# Patient Record
Sex: Male | Born: 1935 | Race: White | Hispanic: No | Marital: Single | Smoking: Former smoker
Health system: Southern US, Community
[De-identification: ages and names within clinical notes are randomized; demographics above are authoritative.]

## PROBLEM LIST (undated history)

## (undated) DIAGNOSIS — I509 Heart failure, unspecified: Secondary | ICD-10-CM

## (undated) DIAGNOSIS — I4891 Unspecified atrial fibrillation: Secondary | ICD-10-CM

## (undated) DIAGNOSIS — I2510 Atherosclerotic heart disease of native coronary artery without angina pectoris: Secondary | ICD-10-CM

## (undated) DIAGNOSIS — M1990 Unspecified osteoarthritis, unspecified site: Secondary | ICD-10-CM

## (undated) DIAGNOSIS — Z8546 Personal history of malignant neoplasm of prostate: Secondary | ICD-10-CM

## (undated) DIAGNOSIS — E785 Hyperlipidemia, unspecified: Secondary | ICD-10-CM

## (undated) DIAGNOSIS — K625 Hemorrhage of anus and rectum: Secondary | ICD-10-CM

## (undated) DIAGNOSIS — I10 Essential (primary) hypertension: Secondary | ICD-10-CM

## (undated) DIAGNOSIS — K5730 Diverticulosis of large intestine without perforation or abscess without bleeding: Secondary | ICD-10-CM

## (undated) DIAGNOSIS — M109 Gout, unspecified: Secondary | ICD-10-CM

## (undated) DIAGNOSIS — C801 Malignant (primary) neoplasm, unspecified: Secondary | ICD-10-CM

## (undated) HISTORY — PX: APPENDECTOMY: SHX54

## (undated) HISTORY — DX: Unspecified atrial fibrillation: I48.91

## (undated) HISTORY — DX: Atherosclerotic heart disease of native coronary artery without angina pectoris: I25.10

## (undated) HISTORY — DX: Hyperlipidemia, unspecified: E78.5

---

## 1994-10-31 DIAGNOSIS — Z8546 Personal history of malignant neoplasm of prostate: Secondary | ICD-10-CM

## 1994-10-31 HISTORY — DX: Personal history of malignant neoplasm of prostate: Z85.46

## 1995-04-01 HISTORY — PX: CORONARY ARTERY BYPASS GRAFT: SHX141

## 1995-04-10 HISTORY — PX: CARDIAC CATHETERIZATION: SHX172

## 2000-07-28 ENCOUNTER — Encounter: Payer: Self-pay | Admitting: *Deleted

## 2000-07-28 ENCOUNTER — Encounter: Admission: RE | Admit: 2000-07-28 | Discharge: 2000-07-28 | Payer: Self-pay | Admitting: *Deleted

## 2000-11-03 ENCOUNTER — Ambulatory Visit (HOSPITAL_COMMUNITY): Admission: RE | Admit: 2000-11-03 | Discharge: 2000-11-03 | Payer: Self-pay | Admitting: Gastroenterology

## 2000-11-03 HISTORY — PX: COLONOSCOPY: SHX174

## 2001-05-08 ENCOUNTER — Encounter (INDEPENDENT_AMBULATORY_CARE_PROVIDER_SITE_OTHER): Payer: Self-pay | Admitting: Specialist

## 2001-05-08 ENCOUNTER — Other Ambulatory Visit: Admission: RE | Admit: 2001-05-08 | Discharge: 2001-05-08 | Payer: Self-pay | Admitting: Urology

## 2003-05-08 ENCOUNTER — Ambulatory Visit (HOSPITAL_COMMUNITY): Admission: RE | Admit: 2003-05-08 | Discharge: 2003-05-08 | Payer: Self-pay | Admitting: Gastroenterology

## 2003-05-08 ENCOUNTER — Encounter (INDEPENDENT_AMBULATORY_CARE_PROVIDER_SITE_OTHER): Payer: Self-pay | Admitting: Specialist

## 2004-05-20 ENCOUNTER — Ambulatory Visit (HOSPITAL_COMMUNITY): Admission: RE | Admit: 2004-05-20 | Discharge: 2004-05-20 | Payer: Self-pay | Admitting: Urology

## 2005-04-07 ENCOUNTER — Ambulatory Visit (HOSPITAL_COMMUNITY): Admission: RE | Admit: 2005-04-07 | Discharge: 2005-04-07 | Payer: Self-pay | Admitting: Urology

## 2005-05-04 HISTORY — PX: CARDIAC CATHETERIZATION: SHX172

## 2005-05-19 ENCOUNTER — Encounter (HOSPITAL_COMMUNITY): Admission: RE | Admit: 2005-05-19 | Discharge: 2005-07-26 | Payer: Self-pay | Admitting: Urology

## 2005-06-23 ENCOUNTER — Ambulatory Visit (HOSPITAL_COMMUNITY): Admission: RE | Admit: 2005-06-23 | Discharge: 2005-06-23 | Payer: Self-pay | Admitting: Urology

## 2005-06-23 HISTORY — PX: OTHER SURGICAL HISTORY: SHX169

## 2007-11-01 HISTORY — PX: EYE SURGERY: SHX253

## 2008-10-31 DIAGNOSIS — M109 Gout, unspecified: Secondary | ICD-10-CM

## 2008-10-31 HISTORY — DX: Gout, unspecified: M10.9

## 2009-09-23 ENCOUNTER — Encounter (HOSPITAL_COMMUNITY): Admission: RE | Admit: 2009-09-23 | Discharge: 2009-12-22 | Payer: Self-pay | Admitting: Internal Medicine

## 2010-11-03 ENCOUNTER — Encounter
Admission: RE | Admit: 2010-11-03 | Discharge: 2010-11-30 | Payer: Self-pay | Source: Home / Self Care | Attending: Internal Medicine | Admitting: Internal Medicine

## 2011-01-16 LAB — CBC
HCT: 34.2 % — ABNORMAL LOW (ref 39.0–52.0)
Hemoglobin: 11.5 g/dL — ABNORMAL LOW (ref 13.0–17.0)
MCHC: 33.7 g/dL (ref 30.0–36.0)
MCV: 85.3 fL (ref 78.0–100.0)
Platelets: 177 10*3/uL (ref 150–400)
RBC: 4 MIL/uL — ABNORMAL LOW (ref 4.22–5.81)
RDW: 16.5 % — ABNORMAL HIGH (ref 11.5–15.5)
WBC: 13.3 10*3/uL — ABNORMAL HIGH (ref 4.0–10.5)

## 2011-01-31 LAB — CBC
HCT: 34.3 % — ABNORMAL LOW (ref 39.0–52.0)
Hemoglobin: 11.6 g/dL — ABNORMAL LOW (ref 13.0–17.0)
RBC: 4.06 MIL/uL — ABNORMAL LOW (ref 4.22–5.81)
RDW: 16 % — ABNORMAL HIGH (ref 11.5–15.5)

## 2011-02-01 LAB — CBC
HCT: 32.6 % — ABNORMAL LOW (ref 39.0–52.0)
Hemoglobin: 11 g/dL — ABNORMAL LOW (ref 13.0–17.0)
MCHC: 33.8 g/dL (ref 30.0–36.0)
MCV: 85.8 fL (ref 78.0–100.0)
RDW: 15.8 % — ABNORMAL HIGH (ref 11.5–15.5)

## 2011-03-18 NOTE — Op Note (Signed)
   NAME:  [name] [name], [name] [name]                             ACCOUNT NO.:  [id number]   MEDICAL RECORD NO.:  [id number]                   PATIENT TYPE:  AMB   LOCATION:  ENDO                                 FACILITY:  [facility]   PHYSICIAN:  James L. Malon Kindle., M.D.          DATE OF BIRTH:  May 20, 1921   DATE OF PROCEDURE:  05/08/2003  DATE OF DISCHARGE:                                 OPERATIVE REPORT   PROCEDURE:  Colonoscopy and biopsy   ENDOSCOPIST:  Llana Aliment. Edwards, M.D.   MEDICATIONS:  Fentanyl 50 mcg, Versed 5 mg IV.   SCOPE:  Olympus pediatric colonoscope.   INDICATIONS:  Continued rectal bleeding with a history of ulcerative  colitis.   DESCRIPTION OF PROCEDURE:  The procedure had been explained to the patient  and consent obtained.  With the patient in the left lateral decubitus, the  Olympus scope was inserted and advanced.  The area of the rectum and sigmoid  were seen to be severely  ulcerated.  We did advance easily to the cecum.  The cecum, ileocecal valve and appendiceal orifice were seen.  The majority  of the colon appeared to be endoscopically normal.  Biopsies were taken of  the ascending colon and transverse colon and placed in jar #1 and #2.  Endoscopically, the left colon was seen from the splenic flexure to the  proximal __________ to 50 cm.  This appeared endoscopically normal.  There  was a translation zone right about 50 cm, and the colon became severely  inflamed and ulcerated with confluent ulcers consistent with left-sided  ulcerative colitis. This was biopsied and placed in jar #3.  The scope was  withdrawn.  The patient tolerated the procedure well.   ASSESSMENT:  Severe left sided ulcerative colitis that has been refractory  to Imuran, Endocort, and Azulfidine.   PLAN:  We will give a trial of Cortenema one q.h.s. and see back in the  office in one month.                                               James L. Malon Kindle., M.D.    Waldron Session  D:   05/08/2003  T:  05/09/2003  Job:  [id number]   cc:   Wilson Singer, M.D.  [address]  [place]  [place] [address]  Fax: [phone]

## 2011-03-18 NOTE — Procedures (Signed)
[facility]  Patient:    [name] [name], [name] [name]                          MRN: [id number] Proc. Date: 11/03/00 Attending:  Verlin Grills, M.D. CC:         Runell Gess, M.D.  Maretta Bees. Vonita Moss, M.D.  Georg Ruddle. Viviann Spare, M.D.   Procedure Report  PROCEDURE:  Colonoscopy.  PROCEDURE INDICATION:  [name] [name] (date of birth July 12, 1921) is a 77 year old male undergoing diagnostic colonoscopy to evaluate Hemoccult-positive stool.  [name] [name] has undergone radiation therapy for prostate cancer in the past.  He has known colonic diverticulosis.  There is no history of colon cancer.  I discussed with [name] [name] the complications associated with colonoscopy and polypectomy, including intestinal bleeding and intestinal perforation.  [name] [name] has signed the operative permit.  PAST MEDICAL HISTORY:  Hypertension, coronary artery disease with coronary artery bypass grafting in July 1996, anaphylactic reaction to ANCEF, prostate cancer treated with radiation therapy, colonic diverticulosis.  ENDOSCOPIST:  Verlin Grills, M.D.  PREMEDICATION:  Versed 5 mg, Demerol 50 mg.  ENDOSCOPE:  Olympus pediatric colonoscope.  DESCRIPTION OF PROCEDURE:  After obtaining informed consent, the patient was placed in the left lateral decubitus position.  I administered intravenous Demerol and intravenous Versed to achieve conscious sedation for the procedure.  The patients blood pressure, oxygen saturation, and cardiac rhythm were monitored throughout the procedure and documented in the medical record.  Anal inspection was normal.  Digital rectal exam was normal.  The prostate was not palpable.  The Olympus pediatric video colonoscope was introduced into the rectum and under direct vision advanced to the cecum, as identified by a normal-appearing ileocecal valve.  Colonic preparation for the exam today was excellent.  Rectum normal.  Sigmoid colon and  descending colon:  Extensive colonic diverticulosis.  Splenic flexure normal.  Transverse colon normal.  Hepatic flexure normal.  Ascending colon normal.  Cecum and ileocecal valve normal.  ASSESSMENT: 1. Left colonic diverticulosis. 2. No endoscopic evidence for the presence of colorectal neoplasia or colon    cancer.  RECOMMENDATIONS:  Repeat colonoscopy in five to 10 years. DD:  11/03/00 TD:  11/03/00 Job: [id number]

## 2011-03-18 NOTE — Op Note (Signed)
[name] [name], [name] [name]                 ACCOUNT NO.:  [id number]   MEDICAL RECORD NO.:  [id number]          PATIENT TYPE:  AMB   LOCATION:  DAY                          FACILITY:  [facility]   PHYSICIAN:  Maretta Bees. Vonita Moss, M.D.DATE OF BIRTH:  08-29-1921   DATE OF PROCEDURE:  06/23/2005  DATE OF DISCHARGE:                                 OPERATIVE REPORT   PREOPERATIVE DIAGNOSIS:  Recurrent prostatic carcinoma.   POSTOPERATIVE DIAGNOSES:  Recurrent prostatic carcinoma.   PROCEDURE:  Cryoablation of the prostate.   SURGEON:  Dr. Larey Dresser.   ASSISTANT:  Dr. Glade Nurse.   PROCTOR:  Dr. Galen Daft.   ANESTHESIA:  General.   INDICATIONS:  This 77 year old gentleman was diagnosed with prostatic  carcinoma 10 years ago and was going to undergo radical prostatectomy but he  had a severe allergic reaction the CEPHALEXIN and postoperatively it was  found he had asymptomatic severe coronary artery disease that was corrected  with a coronary artery bypass graft and then he underwent radiotherapy to  the prostate. Fortunately his PSA has risen again to a level of 12 and a  prostate biopsy on May 30 showed Gleason grade 7 carcinoma bilaterally in  60% of the tissue on each side. He was worked up with a bone scan and CT  scan and there was no evidence of metastatic disease and a Prostascint scan  was also negative for metastatic disease and consistent with localized  disease in the prostate. He and his wife were counseled about cryotherapy  and this was felt to be appropriate view of his age of 38 due to the fact  that he still works actively as an Chief Technology Officer. He was cleared  preoperatively by his cardiologist.   DESCRIPTION OF PROCEDURE:  The patient was brought to the operating room,  placed in lithotomy position. The external genitalia and lower abdomen  prepped and draped in the usual fashion. He was cystoscoped with a flexible  scope and the prostatic urethra was  unremarkable. The bladder was normal.  With the bladder full and under visual control, a stab wound was made in the  suprapubic area and a microinvasive suprapubic catheter was placed in  excellent position. A Foley catheter was reinserted. He underwent  transrectal ultrasound planning and in view of the prostatic size of 40 mL  and the urethral length of 4 cm, it was felt that freeze rods would be the  appropriate tools to utilize. Two probes were placed in the anterolateral  aspect of the prostate so they were located a centimeter from the urethra  and a centimeter from the capsule. A second row of two probes were placed  again with the same precautions to protect the urethra from freezing. A  bottom row three probes were placed to create a typical pyramidal  configuration. A posterior capsular temperature probe was placed and another  probe was placed at the level of the external sphincter  to monitor  temperature. He was then cystoscoped, there was no evidence of the needle  perforation in the urethra or bladder. With a  stiff guidewire, the urethral  warming device was placed without difficulty. He then underwent two  freezings with an interval active thaw that created an excellent ice ball to  the capsule and distally covering the prostate but not damaging the  sphincter. The external sphincter probe temperature was around 18 to 20  degrees. He was then started again on a thaw that allowed [place] to remove all  the needles and the  urethral warming device was run for 20 minutes. The suprapubic tube was sewn  in place and suprapubic tube connected to closed drainage. He was taken to  the recovery room in good condition having tolerated the procedure well.           ______________________________  Maretta Bees. Vonita Moss, M.D.  Electronically Signed     LJP/MEDQ  D:  06/23/2005  T:  06/23/2005  Job:  [id number]

## 2011-09-21 ENCOUNTER — Other Ambulatory Visit (HOSPITAL_COMMUNITY): Payer: Self-pay | Admitting: *Deleted

## 2011-09-27 ENCOUNTER — Encounter (HOSPITAL_COMMUNITY)
Admission: RE | Admit: 2011-09-27 | Discharge: 2011-09-27 | Disposition: A | Discharge status: Home / Self Care | Payer: PRIVATE HEALTH INSURANCE | Source: Ambulatory Visit | Attending: Internal Medicine | Admitting: Internal Medicine

## 2011-09-27 ENCOUNTER — Encounter (HOSPITAL_COMMUNITY): Payer: Self-pay

## 2011-09-27 DIAGNOSIS — D649 Anemia, unspecified: Secondary | ICD-10-CM | POA: Insufficient documentation

## 2011-09-27 HISTORY — DX: Essential (primary) hypertension: I10

## 2011-09-27 HISTORY — DX: Personal history of malignant neoplasm of prostate: Z85.46

## 2011-09-27 MED ORDER — SODIUM CHLORIDE 0.9 % IV SOLN
INTRAVENOUS | Status: DC
  Administered 2011-09-27: 20 mL via INTRAVENOUS

## 2011-09-27 MED ORDER — FERUMOXYTOL INJECTION 510 MG/17 ML
510.0000 mg | Freq: Once | INTRAVENOUS | Status: DC
  Administered 2011-09-27: 510 mg via INTRAVENOUS
  Filled 2011-09-27 (×2): qty 17

## 2011-09-28 ENCOUNTER — Other Ambulatory Visit (HOSPITAL_COMMUNITY): Payer: Self-pay | Admitting: *Deleted

## 2011-09-29 ENCOUNTER — Other Ambulatory Visit (HOSPITAL_COMMUNITY): Payer: Self-pay | Admitting: *Deleted

## 2011-09-30 ENCOUNTER — Encounter (HOSPITAL_COMMUNITY)
Admission: RE | Admit: 2011-09-30 | Discharge: 2011-09-30 | Disposition: A | Discharge status: Home / Self Care | Payer: PRIVATE HEALTH INSURANCE | Source: Ambulatory Visit | Attending: Internal Medicine | Admitting: Internal Medicine

## 2011-09-30 ENCOUNTER — Encounter (HOSPITAL_COMMUNITY): Payer: Self-pay

## 2011-09-30 MED ORDER — FERUMOXYTOL INJECTION 510 MG/17 ML
510.0000 mg | INTRAVENOUS | Status: AC
  Administered 2011-09-30: 510 mg via INTRAVENOUS
  Filled 2011-09-30: qty 17

## 2011-09-30 MED ORDER — SODIUM CHLORIDE 0.9 % IV SOLN
Freq: Once | INTRAVENOUS | Status: AC
  Administered 2011-09-30: 16:00:00 via INTRAVENOUS

## 2012-07-04 ENCOUNTER — Other Ambulatory Visit (HOSPITAL_COMMUNITY): Payer: Self-pay | Admitting: *Deleted

## 2012-07-09 ENCOUNTER — Encounter (HOSPITAL_COMMUNITY): Payer: Self-pay

## 2012-07-09 ENCOUNTER — Encounter (HOSPITAL_COMMUNITY)
Admission: RE | Admit: 2012-07-09 | Discharge: 2012-07-09 | Disposition: A | Discharge status: Home / Self Care | Payer: PRIVATE HEALTH INSURANCE | Source: Ambulatory Visit | Attending: Internal Medicine | Admitting: Internal Medicine

## 2012-07-09 DIAGNOSIS — D649 Anemia, unspecified: Secondary | ICD-10-CM | POA: Insufficient documentation

## 2012-07-09 HISTORY — DX: Unspecified osteoarthritis, unspecified site: M19.90

## 2012-07-09 HISTORY — DX: Gout, unspecified: M10.9

## 2012-07-09 MED ORDER — SODIUM CHLORIDE 0.9 % IV SOLN
INTRAVENOUS | Status: DC
  Administered 2012-07-09: 11:00:00 via INTRAVENOUS

## 2012-07-09 MED ORDER — FERUMOXYTOL INJECTION 510 MG/17 ML
510.0000 mg | INTRAVENOUS | Status: DC
  Administered 2012-07-09: 510 mg via INTRAVENOUS
  Filled 2012-07-09: qty 17

## 2012-07-09 NOTE — Progress Notes (Signed)
No immediate problems w feraheme

## 2012-07-16 ENCOUNTER — Encounter (HOSPITAL_COMMUNITY): Payer: Self-pay

## 2012-07-16 ENCOUNTER — Encounter (HOSPITAL_COMMUNITY)
Admission: RE | Admit: 2012-07-16 | Discharge: 2012-07-16 | Disposition: A | Discharge status: Home / Self Care | Payer: PRIVATE HEALTH INSURANCE | Source: Ambulatory Visit | Attending: Internal Medicine | Admitting: Internal Medicine

## 2012-07-16 MED ORDER — FERUMOXYTOL INJECTION 510 MG/17 ML
510.0000 mg | INTRAVENOUS | Status: AC
  Administered 2012-07-16: 510 mg via INTRAVENOUS
  Filled 2012-07-16: qty 17

## 2012-07-16 MED ORDER — SODIUM CHLORIDE 0.9 % IV SOLN
INTRAVENOUS | Status: AC
  Administered 2012-07-16: 11:00:00 via INTRAVENOUS

## 2013-01-16 ENCOUNTER — Ambulatory Visit: Payer: Self-pay | Admitting: Cardiovascular Disease

## 2013-01-16 DIAGNOSIS — Z7901 Long term (current) use of anticoagulants: Secondary | ICD-10-CM

## 2013-01-16 DIAGNOSIS — I4891 Unspecified atrial fibrillation: Secondary | ICD-10-CM | POA: Insufficient documentation

## 2013-04-16 ENCOUNTER — Inpatient Hospital Stay (HOSPITAL_COMMUNITY)
Admission: EM | Admit: 2013-04-16 | Discharge: 2013-04-18 | DRG: 377 | Disposition: A | Discharge status: Home Health | Payer: PRIVATE HEALTH INSURANCE | Attending: Internal Medicine | Admitting: Internal Medicine

## 2013-04-16 ENCOUNTER — Encounter (HOSPITAL_COMMUNITY): Payer: Self-pay | Admitting: Emergency Medicine

## 2013-04-16 ENCOUNTER — Emergency Department (HOSPITAL_COMMUNITY): Payer: PRIVATE HEALTH INSURANCE

## 2013-04-16 DIAGNOSIS — D649 Anemia, unspecified: Secondary | ICD-10-CM | POA: Diagnosis present

## 2013-04-16 DIAGNOSIS — D62 Acute posthemorrhagic anemia: Secondary | ICD-10-CM | POA: Diagnosis present

## 2013-04-16 DIAGNOSIS — I5041 Acute combined systolic (congestive) and diastolic (congestive) heart failure: Secondary | ICD-10-CM

## 2013-04-16 DIAGNOSIS — K922 Gastrointestinal hemorrhage, unspecified: Secondary | ICD-10-CM | POA: Diagnosis present

## 2013-04-16 DIAGNOSIS — N183 Chronic kidney disease, stage 3 unspecified: Secondary | ICD-10-CM | POA: Diagnosis present

## 2013-04-16 DIAGNOSIS — I2789 Other specified pulmonary heart diseases: Secondary | ICD-10-CM | POA: Diagnosis present

## 2013-04-16 DIAGNOSIS — Z8719 Personal history of other diseases of the digestive system: Secondary | ICD-10-CM

## 2013-04-16 DIAGNOSIS — Z7901 Long term (current) use of anticoagulants: Secondary | ICD-10-CM

## 2013-04-16 DIAGNOSIS — I10 Essential (primary) hypertension: Secondary | ICD-10-CM | POA: Diagnosis present

## 2013-04-16 DIAGNOSIS — M109 Gout, unspecified: Secondary | ICD-10-CM | POA: Diagnosis present

## 2013-04-16 DIAGNOSIS — I359 Nonrheumatic aortic valve disorder, unspecified: Secondary | ICD-10-CM | POA: Diagnosis present

## 2013-04-16 DIAGNOSIS — Z8546 Personal history of malignant neoplasm of prostate: Secondary | ICD-10-CM

## 2013-04-16 DIAGNOSIS — I5032 Chronic diastolic (congestive) heart failure: Secondary | ICD-10-CM | POA: Diagnosis present

## 2013-04-16 DIAGNOSIS — I129 Hypertensive chronic kidney disease with stage 1 through stage 4 chronic kidney disease, or unspecified chronic kidney disease: Secondary | ICD-10-CM | POA: Diagnosis present

## 2013-04-16 DIAGNOSIS — Z79899 Other long term (current) drug therapy: Secondary | ICD-10-CM

## 2013-04-16 DIAGNOSIS — Z66 Do not resuscitate: Secondary | ICD-10-CM | POA: Diagnosis not present

## 2013-04-16 DIAGNOSIS — D509 Iron deficiency anemia, unspecified: Secondary | ICD-10-CM | POA: Diagnosis present

## 2013-04-16 DIAGNOSIS — I4891 Unspecified atrial fibrillation: Secondary | ICD-10-CM | POA: Diagnosis present

## 2013-04-16 DIAGNOSIS — K5731 Diverticulosis of large intestine without perforation or abscess with bleeding: Principal | ICD-10-CM | POA: Diagnosis present

## 2013-04-16 DIAGNOSIS — I25810 Atherosclerosis of coronary artery bypass graft(s) without angina pectoris: Secondary | ICD-10-CM | POA: Diagnosis present

## 2013-04-16 DIAGNOSIS — E8770 Fluid overload, unspecified: Secondary | ICD-10-CM | POA: Diagnosis present

## 2013-04-16 DIAGNOSIS — I872 Venous insufficiency (chronic) (peripheral): Secondary | ICD-10-CM | POA: Diagnosis present

## 2013-04-16 DIAGNOSIS — E8779 Other fluid overload: Secondary | ICD-10-CM

## 2013-04-16 DIAGNOSIS — E785 Hyperlipidemia, unspecified: Secondary | ICD-10-CM | POA: Diagnosis present

## 2013-04-16 DIAGNOSIS — K219 Gastro-esophageal reflux disease without esophagitis: Secondary | ICD-10-CM | POA: Diagnosis present

## 2013-04-16 DIAGNOSIS — I509 Heart failure, unspecified: Secondary | ICD-10-CM | POA: Diagnosis present

## 2013-04-16 HISTORY — DX: Diverticulosis of large intestine without perforation or abscess without bleeding: K57.30

## 2013-04-16 HISTORY — DX: Malignant (primary) neoplasm, unspecified: C80.1

## 2013-04-16 LAB — PROTIME-INR
INR: 1.15 (ref 0.00–1.49)
Prothrombin Time: 14.5 seconds (ref 11.6–15.2)

## 2013-04-16 LAB — CBC WITH DIFFERENTIAL/PLATELET
Basophils Absolute: 0 10*3/uL (ref 0.0–0.1)
Eosinophils Absolute: 0 10*3/uL (ref 0.0–0.7)
Eosinophils Relative: 1 % (ref 0–5)
MCH: 26.9 pg (ref 26.0–34.0)
MCHC: 31.6 g/dL (ref 30.0–36.0)
MCV: 85.2 fL (ref 78.0–100.0)
Monocytes Absolute: 0.4 10*3/uL (ref 0.1–1.0)
Platelets: 208 10*3/uL (ref 150–400)
RDW: 19 % — ABNORMAL HIGH (ref 11.5–15.5)
WBC: 8.8 10*3/uL (ref 4.0–10.5)

## 2013-04-16 LAB — PREPARE RBC (CROSSMATCH)

## 2013-04-16 LAB — COMPREHENSIVE METABOLIC PANEL
ALT: 13 U/L (ref 0–53)
AST: 20 U/L (ref 0–37)
Calcium: 9 mg/dL (ref 8.4–10.5)
Creatinine, Ser: 1.82 mg/dL — ABNORMAL HIGH (ref 0.50–1.35)
GFR calc Af Amer: 36 mL/min — ABNORMAL LOW (ref >90)
Sodium: 140 mEq/L (ref 135–145)
Total Protein: 8.2 g/dL (ref 6.0–8.3)

## 2013-04-16 LAB — OCCULT BLOOD, POC DEVICE: Fecal Occult Bld: POSITIVE — AB

## 2013-04-16 LAB — ABO/RH: ABO/RH(D): O NEG

## 2013-04-16 LAB — TROPONIN I: Troponin I: <0.3 ng/mL (ref <0.30)

## 2013-04-16 MED ORDER — PANTOPRAZOLE SODIUM 40 MG IV SOLR
40.0000 mg | Freq: Once | INTRAVENOUS | Status: AC
  Administered 2013-04-16: 40 mg via INTRAVENOUS
  Filled 2013-04-16: qty 40

## 2013-04-16 MED ORDER — PANTOPRAZOLE SODIUM 40 MG PO TBEC
40.0000 mg | DELAYED_RELEASE_TABLET | Freq: Two times a day (BID) | ORAL | Status: DC
  Administered 2013-04-16 – 2013-04-18 (×4): 40 mg via ORAL
  Filled 2013-04-16 (×7): qty 1

## 2013-04-16 MED ORDER — FUROSEMIDE 10 MG/ML IJ SOLN
40.0000 mg | Freq: Two times a day (BID) | INTRAMUSCULAR | Status: DC
  Administered 2013-04-16: 40 mg via INTRAVENOUS
  Filled 2013-04-16 (×4): qty 4

## 2013-04-16 MED ORDER — ACETAMINOPHEN 650 MG RE SUPP: 650.0000 mg | Freq: Four times a day (QID) | RECTAL | Status: DC | PRN

## 2013-04-16 MED ORDER — DOXYCYCLINE HYCLATE 100 MG PO TABS
100.0000 mg | ORAL_TABLET | Freq: Two times a day (BID) | ORAL | Status: DC
  Administered 2013-04-16 – 2013-04-18 (×4): 100 mg via ORAL
  Filled 2013-04-16 (×5): qty 1

## 2013-04-16 MED ORDER — MORPHINE SULFATE 2 MG/ML IJ SOLN
2.0000 mg | Freq: Once | INTRAMUSCULAR | Status: AC
  Administered 2013-04-16: 2 mg via INTRAVENOUS
  Filled 2013-04-16: qty 1

## 2013-04-16 MED ORDER — ACETAMINOPHEN 325 MG PO TABS
650.0000 mg | ORAL_TABLET | Freq: Four times a day (QID) | ORAL | Status: DC | PRN
  Administered 2013-04-16 – 2013-04-18 (×4): 650 mg via ORAL
  Filled 2013-04-16 (×4): qty 2

## 2013-04-16 MED ORDER — SIMVASTATIN 20 MG PO TABS
20.0000 mg | ORAL_TABLET | Freq: Every day | ORAL | Status: DC
  Administered 2013-04-16 – 2013-04-17 (×2): 20 mg via ORAL
  Filled 2013-04-16 (×3): qty 1

## 2013-04-16 MED ORDER — SODIUM CHLORIDE 0.9 % IJ SOLN
3.0000 mL | Freq: Two times a day (BID) | INTRAMUSCULAR | Status: DC
  Administered 2013-04-16 – 2013-04-17 (×2): 3 mL via INTRAVENOUS

## 2013-04-16 MED ORDER — IRBESARTAN 150 MG PO TABS
150.0000 mg | ORAL_TABLET | Freq: Every day | ORAL | Status: DC
  Administered 2013-04-16 – 2013-04-18 (×3): 150 mg via ORAL
  Filled 2013-04-16 (×3): qty 1

## 2013-04-16 MED ORDER — ACETAMINOPHEN 325 MG PO TABS
650.0000 mg | ORAL_TABLET | Freq: Once | ORAL | Status: AC
  Administered 2013-04-16: 650 mg via ORAL
  Filled 2013-04-16: qty 2

## 2013-04-16 MED ORDER — METOPROLOL TARTRATE 50 MG PO TABS
50.0000 mg | ORAL_TABLET | Freq: Two times a day (BID) | ORAL | Status: DC
  Administered 2013-04-17 – 2013-04-18 (×3): 50 mg via ORAL
  Filled 2013-04-16 (×5): qty 1

## 2013-04-16 MED ORDER — ONDANSETRON HCL 4 MG/2ML IJ SOLN
4.0000 mg | Freq: Once | INTRAMUSCULAR | Status: AC
  Administered 2013-04-16: 4 mg via INTRAVENOUS
  Filled 2013-04-16: qty 2

## 2013-04-16 MED ORDER — ONDANSETRON HCL 4 MG PO TABS: 4.0000 mg | ORAL_TABLET | Freq: Four times a day (QID) | ORAL | Status: DC | PRN

## 2013-04-16 MED ORDER — ONDANSETRON HCL 4 MG/2ML IJ SOLN: 4.0000 mg | Freq: Four times a day (QID) | INTRAMUSCULAR | Status: DC | PRN

## 2013-04-16 MED ORDER — DIPHENHYDRAMINE HCL 25 MG PO CAPS
25.0000 mg | ORAL_CAPSULE | Freq: Once | ORAL | Status: AC
  Administered 2013-04-16: 25 mg via ORAL
  Filled 2013-04-16: qty 1

## 2013-04-16 MED ORDER — FUROSEMIDE 10 MG/ML IJ SOLN
80.0000 mg | Freq: Once | INTRAMUSCULAR | Status: AC
  Administered 2013-04-16: 80 mg via INTRAVENOUS
  Filled 2013-04-16 (×2): qty 4

## 2013-04-16 NOTE — Consult Note (Signed)
Referring Provider: Dr. Timothy Lasso    Primary Care Physician:  Gwen Pounds, MD Primary Gastroenterologist:  Dr. Randa Evens  Reason for Consultation:  Rectal bleeding  HPI: [name] [name] is a 79 y.o. male with a history of left-sided ulcerative colitis, who was last seen by Dr. Randa Evens in 2011 and at that time was on Prednisone, Colazal, and Hydrocortisone suppositories. He reports stopping those meds at some point and has been off of meds for over a year. States that he has not had any rectal bleeding or abdominal pain until recently when he had the acute onset of bright red blood per rectum with clots and stool. Rectal bleeding occurred multiple times yesterday without associated abdominal pain. Reportedly had dark brown clots with the BRBPR. Hgb 7.8 prior to admit and 8.0 on admit. Reports being off of Coumadin 3-4 weeks that he had been on for Afib. Last colonoscopy in 2007 that showed left-sided ulcerative colitis. He has been having leg pains and swelling and is being worked up for that. Receiving his 2nd unit of blood at this time.        Past Medical History  Diagnosis Date  . Hypertension   . History of prostate cancer 1996    treated with radiation  . Arthritis   . Gout, arthritis 2010    bil feet  . Cancer     PORSTATE-HX OF RADIATION AND SURGERY  . Diverticulosis of colon     Past Surgical History  Procedure Laterality Date  . Coronary artery bypass graft  04/1995  . Appendectomy  1950s  . Eye surgery  2009    cataract surgery  . Cryoablation of prostate  06/23/2005  . Colonoscopy  11/03/2000    Prior to Admission medications   Medication Sig Start Date End Date Taking? Authorizing Provider  aspirin 81 MG tablet Take 81 mg by mouth daily.   Yes Historical Provider, MD  calcium carbonate (TUMS - DOSED IN MG ELEMENTAL CALCIUM) 500 MG chewable tablet Chew 1 tablet by mouth as needed.   Yes Historical Provider, MD  furosemide (LASIX) 40 MG tablet Take 40 mg by mouth daily.   Yes  Historical Provider, MD  lovastatin (MEVACOR) 40 MG tablet Take 40 mg by mouth at bedtime.     Yes Historical Provider, MD  metoprolol (LOPRESSOR) 50 MG tablet Take 50 mg by mouth 2 (two) times daily.     Yes Historical Provider, MD  valsartan (DIOVAN) 80 MG tablet Take 80 mg by mouth daily.   Yes Historical Provider, MD  valsartan-hydrochlorothiazide (DIOVAN-HCT) 80-12.5 MG per tablet Take 1 tablet by mouth daily.     Yes Historical Provider, MD    Scheduled Meds: . doxycycline  100 mg Oral Q12H  . furosemide  40 mg Intravenous BID  . irbesartan  150 mg Oral Daily  . metoprolol  50 mg Oral BID  . pantoprazole  40 mg Oral BID AC  . simvastatin  20 mg Oral q1800  . sodium chloride  3 mL Intravenous Q12H   Continuous Infusions:  PRN Meds:.acetaminophen, acetaminophen, ondansetron (ZOFRAN) IV, ondansetron  Allergies as of 04/16/2013 - Review Complete 04/16/2013  Allergen Reaction Noted  . Ancef (cefazolin) Anaphylaxis 04/16/2013  . Penicillins cross reactors  09/27/2011  . Sulfa antibiotics  07/09/2012    History reviewed. No pertinent family history.  History   Social History  . Marital Status: Single    Spouse Name: N/A    Number of Children: N/A  . Years of  Education: N/A   Occupational History  . Not on file.   Social History Main Topics  . Smoking status: Former Smoker    Quit date: 09/26/1984  . Smokeless tobacco: Never Used  . Alcohol Use: No  . Drug Use: No  . Sexually Active: No   Other Topics Concern  . Not on file   Social History Narrative  . No narrative on file    Review of Systems: All negative from GI standpoint except as stated above in HPI.  Physical Exam: Vital signs: Filed Vitals:   04/16/13 1655  BP: 171/82  Pulse: 56  Temp: 97.4  Resp: 20     General:   Elderly, Alert,  Well-developed, well-nourished, pleasant and cooperative in NAD HEENT: anicteric, oropharynx clear Neck: nontender, supple Lungs:  Clear throughout to  auscultation.   No wheezes, crackles, or rhonchi. No acute distress. Heart:  Irregularly irregular rhythm Abdomen: soft, nontender, nondistended, +BS  Rectal:  Deferred Ext: LE erythema, edema, and bullae noted  GI:  Lab Results:  Recent Labs  04/16/13 0854  WBC 8.8  HGB 8.0*  HCT 25.3*  PLT 208   BMET  Recent Labs  04/16/13 0854  NA 140  K 4.1  CL 105  CO2 27  GLUCOSE 111*  BUN 51*  CREATININE 1.82*  CALCIUM 9.0   LFT  Recent Labs  04/16/13 0854  PROT 8.2  ALBUMIN 2.8*  AST 20  ALT 13  ALKPHOS 140*  BILITOT 0.5   PT/INR  Recent Labs  04/16/13 0854  LABPROT 14.5  INR 1.15     Studies/Results: Dg Chest Port 1 View  04/16/2013   *RADIOLOGY REPORT*  Clinical Data: Cough.  PORTABLE CHEST - 1 VIEW  Comparison: None.  Findings: The heart is enlarged.  There are surgical changes from bypass surgery.  There is tortuosity and calcification of the thoracic aorta.  There are increased interstitial markings and peribronchial thickening which could reflect chronic bronchitic changes related to smoking. Mild elevation of the left hemidiaphragm with overlying vascular crowding and atelectasis.  No edema or definite effusions.  The bony thorax is intact.  IMPRESSION:  1.  Cardiac enlargement. 2.  Probable chronic bronchitic type lung changes.  No definite infiltrates or effusions. 3.  Left basilar atelectasis.   Original Report Authenticated By: Rudie Meyer, M.D.    Impression/Plan: 79 yo with history of left-sided ulcerative colitis presenting with the acute onset of BRBPR without associated diarrhea or abdominal pain. I do NOT think his bleeding is from his UC and he could be having a diverticular bleed. If bleeding returns with abdominal pain or diarrhea or bleeding persists then may need to add Rowasa enemas or PO mesalamine. For now would NOT recommend restarting UC treatment and would recommend conservative treatment and avoidance of Coumadin if cards ok with that.  Available to see again if needed. Will sign off but do not hesitate to call if questions. Thank you for this consultation.     LOS: 0 days   [name] C.  04/16/2013, 5:04 PM

## 2013-04-16 NOTE — ED Notes (Signed)
Aware of need to call admitting MD when pt arrives to floor.  Pt has two IV.  Pt also has edematous LE oozing clear liquids,  Not new to this admission. Pt is ion no acute distress

## 2013-04-16 NOTE — ED Notes (Signed)
[id number]<AV> Expected date:<BR> Expected time:<BR> Means of arrival:<BR> Comments:<BR> Rectal bleeding

## 2013-04-16 NOTE — Consult Note (Signed)
WOC consult Note Reason for Consult:Venous insufficiency with ulcerations Wound type:venous insufficiency vs ulcers with mixed etiology Pressure Ulcer POA: No Measurement:  8cm x 8cm x .2cm Wound bed: pale pink wound bed, moist (wet) Drainage (amount, consistency, odor) serous exudate, primarily from right LE.   Periwound:Anterior aspects of Right and Left LEs with edema, erythema, mild hemosiderin staining.  Right LE with a cluster of partial thickness ulcerations weeping serous exudate Dressing procedure/placement/frequency: I will cover open areas with a soft silicone foam dressing, elevate LEs both in and out of bed.  Recommend (as erythema and edema resolve) that we consider 2 or 4-layer compression wraps as part of the POC.  HHRN can apply, please order if you agree. I will not follow, but will remain available to this patient, his medical and nursing teams.  Please re-consult if needed. Thanks, Ladona Mow, MSN, RN, GNP, [place], CWON-AP [phone])

## 2013-04-16 NOTE — H&P (Signed)
[name] [name] is an 79 y.o. male.   PCP:   Gwen Pounds, MD   Chief Complaint:  LGIB, Hematochezia  HPI: 34 Male with MMP who has been seen in our office twice in the last week. We have been dealing with LE Edema, Weeping Edema, Vol Overload and DOE.  His Cr was 2.1 and Hbg in 8's last week.  We diuresed 9#s off him and his Cr improved as well as better breath and better thriving.  He had a Hbg of 7.8 and I offered him admission.  He elected to get Transfusion as outpt and follow up quickly.  This was unable to take place as he started having BRBPR, dark brown clots in diaper and brought to ED via EMS for eval and Rx.  No Ab Pain.  Examination in ED revealed the presence of clots and gross blood. Patient's vital signs stable other than elevated blood pressure. Has been off Coumadin of late.  Hbg at 8 is better than yesterday although I expect it to drop.  I was called to admit. Cr is stable to yesterday and better than last week.  I asked ED to start 2 Units PRBCs and get him a Tele bed. There is erythema associated with the swelling of LEs but no cellulitis appreciated and WBC 8.8.  Appears Venous stasis with Dermatitis.  He has been slightly short of breath. Chest x-ray does not show any overt pulmonary edema, but BNP is 19,449. Patient likely having some element of congestive heart failure which will complicate blood transfusion making IV Lasix necessary.  Of Note Dr Allyson Sabal is his Cardiologist and last ECHO 2008 showed EF 45% c Mod Pulm HTN.  Last Myoview was 2010 and Non-Ischemic.  Known AFib. Last saw Dr Allyson Sabal 04/2012 and last coumadin check was 12/2012 prior to D/cin it..  Also L Sided UC treated per Dr Randa Evens.  In ED Rxed c Lasix 80 IV x 1, Tylenol, Benedryl, MSO4, Zofran, and IV Protonix.  Past Medical History:  Past Medical History  Diagnosis Date  . Hypertension   . History of prostate cancer 1996    treated with radiation  . Arthritis   . Gout, arthritis 2010    bil feet  . Cancer      PORSTATE-HX OF RADIATION AND SURGERY  . Diverticulosis of colon    Coronary artery disease (CABG 1996) followed by Dr Allyson Sabal,   Atrial fibrillation on chronic coumadin,  GERD/LPR,  Gout,  Hyperlipidemia,  Hypertension,   Benign neoplasm of rectum and anal canal (Colonoscopy August 2007, Dr. Randa Evens),  Cataracts, bilateral,    Diverticular disease,  Fatty liver (Ultrasound 07/2008),   Neuropathy Prostate cancer (June 1996 S/P XRT x 38; recurrance May 2006 S/P cryotherapy),  CKD/Chronic Renal Insufficiency with microalbuminuria,  Tremor, essential (Benign),  Ulcerative colitis,  Grief,  Gout,  Anemia   Past Surgical History  Procedure Laterality Date  . Coronary artery bypass graft  04/1995  . Appendectomy  1950s  . Eye surgery  2009    cataract surgery  . Cryoablation of prostate  06/23/2005  . Colonoscopy  11/03/2000   Recurrent prostatic carcinoma S/P Cryoablation of the prostate. Dr Vonita Moss (May 2006) CABG x 6 (1996) Appendectomy (1953)   Allergies:   Allergies  Allergen Reactions  . Ancef (Cefazolin) Anaphylaxis  . Penicillins Cross Reactors     Anaphylaxix/don't give anything in the "family" of penicillins  . Sulfa Antibiotics     Don't remember. Didn't want to give  me anymore     Medications: Prior to Admission medications   Medication Sig Start Date End Date Taking? Authorizing Provider  aspirin 81 MG tablet Take 81 mg by mouth daily.   Yes Historical Provider, MD  calcium carbonate (TUMS - DOSED IN MG ELEMENTAL CALCIUM) 500 MG chewable tablet Chew 1 tablet by mouth as needed.   Yes Historical Provider, MD  furosemide (LASIX) 40 MG tablet Take 40 mg by mouth daily.   Yes Historical Provider, MD  lovastatin (MEVACOR) 40 MG tablet Take 40 mg by mouth at bedtime.     Yes Historical Provider, MD  metoprolol (LOPRESSOR) 50 MG tablet Take 50 mg by mouth 2 (two) times daily.     Yes Historical Provider, MD  valsartan (DIOVAN) 80 MG tablet Take 80 mg by mouth daily.   Yes  Historical Provider, MD  valsartan-hydrochlorothiazide (DIOVAN-HCT) 80-12.5 MG per tablet Take 1 tablet by mouth daily.     Yes Historical Provider, MD   Complete Medication List: 1)  Furosemide 40 Mg Tabs (Furosemide) .... One pill in am for fluid in legs 2)  Diovan 80 Mg Tabs (Valsartan) .... One pill in am 3)  Tums 500 Mg Chew (Calcium carbonate antacid) .... Take 1 tab daily 4)  Tylenol 325 Mg Tabs (Acetaminophen) .... Take 1 tab in the morning and 1 tab in the evening for pain 5)  Adult Aspirin Ec Low Strength 81 Mg Tbec (Aspirin) .... Take 1 tab daily 6)  Colcrys 0.6 Mg Tabs (Colchicine) .Marland Kitchen.. 1 po qd prn gout 7)  Metoprolol Tartrate 50 Mg Tabs (Metoprolol tartrate) .... Take one tablet by mouth twice a day 8)  Lovastatin 40 Mg Tabs (Lovastatin) .... Take one tablet by mouth once a day 9)  Aranesp (albumin Free) Soln (Darbepoetin alfa-polysorbate soln) .... sq every 2 weeks as needed only  Medications Prior to Admission  Medication Sig Dispense Refill  . aspirin 81 MG tablet Take 81 mg by mouth daily.      . calcium carbonate (TUMS - DOSED IN MG ELEMENTAL CALCIUM) 500 MG chewable tablet Chew 1 tablet by mouth as needed.      . furosemide (LASIX) 40 MG tablet Take 40 mg by mouth daily.      Marland Kitchen lovastatin (MEVACOR) 40 MG tablet Take 40 mg by mouth at bedtime.        . metoprolol (LOPRESSOR) 50 MG tablet Take 50 mg by mouth 2 (two) times daily.        . valsartan (DIOVAN) 80 MG tablet Take 80 mg by mouth daily.      . valsartan-hydrochlorothiazide (DIOVAN-HCT) 80-12.5 MG per tablet Take 1 tablet by mouth daily.           Social History:  reports that he quit smoking about 28 years ago. He has never used smokeless tobacco. He reports that he does not drink alcohol or use illicit drugs. Patient is Widowed and has two daughters and three grandchildren.  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  Patient has history of tobacco abuse; he quit in 1985.  Denies alcohol  consumption or illicit drug use.  Smoked Tobacco Use:  Former smoker       Years Since Last Quit:  29 Passive smoke exposure:  no Drug use:  no HIV high-risk behavior:  no Caffeine use:  0 drinks per day Exercise:  no Seatbelt use:  100 % Sun Exposure:  frequently  Previous Tobacco Use: Signed  On - 04/11/2013 Smoked Tobacco Use:  former smoker       Year quit:  1985       Years Since Last Quit:  29 years, 5 months, 15 days Passive smoke exposure:  no Drug use:  no HIV high-risk behavior:  no Caffeine use:  0 drinks per day  Previous Alcohol Use:  Exercise:  no Seatbelt use:  100 % Sun Exposure:  frequently  Colonoscopy History:    Date of Last Colonoscopy:  06/01/2006  Family History: History reviewed. No pertinent family history. Father Baker Pierini.) - Has Family History of Heart Disease - Entered On: 04-26-2013 Father:  Deceased at age 33; history of CAD. Mother:  Deceased at age 22; history of CAD.  Review of Systems:  Review of Systems - Complains of fatigue, weight loss.   Complains of peripheral edema.   Complains of joint swelling, stiffness, arthritis.   Complains of dryness.   DOE/SOB Full ROS Obtained    Physical Exam:  Blood pressure 167/64, pulse 58, temperature 97.5 F (36.4 C), temperature source Oral, resp. rate 24, SpO2 97.00%. Filed Vitals:   04/16/13 1130 04/16/13 1200 04/16/13 1245 04/16/13 1253  BP: 164/84 155/62 167/64   Pulse: 53 53 58   Temp:    97.5 F (36.4 C)  TempSrc:    Oral  Resp: 26 22 24    SpO2: 98% 97% 97%    General appearance: looks better here in ED than in my office. Head: Normocephalic, without obvious abnormality, atraumatic Eyes: conjunctivae/corneas clear. PERRL, EOM's intact.  Nose: Nares normal. Septum midline. Mucosa normal. No drainage or sinus tenderness. Throat: lips, mucosa, and tongue normal; teeth and gums normal Neck: + JVD Resp: Mostly clear Cardio: irreg, sternotomy, m GI: soft, non-tender; bowel sounds  normal; no masses,  no organomegaly Extremities: 1-2 + Weaping edema, excoriations and erythema.  Dermatitis s Celluilitis at this time. Pulses: 1+ and symmetric Lymph nodes: no cervical lymphadenopathy Neurologic: Alert and oriented X 3, normal strength and tone. Normal symmetric reflexes.     Labs on Admission:   Recent Labs  04/16/13 0854  NA 140  K 4.1  CL 105  CO2 27  GLUCOSE 111*  BUN 51*  CREATININE 1.82*  CALCIUM 9.0    Recent Labs  04/16/13 0854  AST 20  ALT 13  ALKPHOS 140*  BILITOT 0.5  PROT 8.2  ALBUMIN 2.8*   No results found for this basename: LIPASE, AMYLASE,  in the last 72 hours  Recent Labs  04/16/13 0854  WBC 8.8  NEUTROABS 6.7  HGB 8.0*  HCT 25.3*  MCV 85.2  PLT 208    Recent Labs  04/16/13 0854  TROPONINI <0.30   Lab Results  Component Value Date   INR 1.15 04/16/2013     LAB RESULT POCT:  Results for orders placed during the hospital encounter of 04/16/13  CBC WITH DIFFERENTIAL      Result Value Range   WBC 8.8  4.0 - 10.5 K/uL   RBC 2.97 (*) 4.22 - 5.81 MIL/uL   Hemoglobin 8.0 (*) 13.0 - 17.0 g/dL   HCT 16.1 (*) 09.6 - 04.5 %   MCV 85.2  78.0 - 100.0 fL   MCH 26.9  26.0 - 34.0 pg   MCHC 31.6  30.0 - 36.0 g/dL   RDW 40.9 (*) 81.1 - 91.4 %   Platelets 208  150 - 400 K/uL   Neutrophils Relative % 76  43 - 77 %   Neutro  Abs 6.7  1.7 - 7.7 K/uL   Lymphocytes Relative 18  12 - 46 %   Lymphs Abs 1.6  0.7 - 4.0 K/uL   Monocytes Relative 5  3 - 12 %   Monocytes Absolute 0.4  0.1 - 1.0 K/uL   Eosinophils Relative 1  0 - 5 %   Eosinophils Absolute 0.0  0.0 - 0.7 K/uL   Basophils Relative 0  0 - 1 %   Basophils Absolute 0.0  0.0 - 0.1 K/uL  COMPREHENSIVE METABOLIC PANEL      Result Value Range   Sodium 140  135 - 145 mEq/L   Potassium 4.1  3.5 - 5.1 mEq/L   Chloride 105  96 - 112 mEq/L   CO2 27  19 - 32 mEq/L   Glucose, Bld 111 (*) 70 - 99 mg/dL   BUN 51 (*) 6 - 23 mg/dL   Creatinine, Ser 1.61 (*) 0.50 - 1.35 mg/dL    Calcium 9.0  8.4 - 09.6 mg/dL   Total Protein 8.2  6.0 - 8.3 g/dL   Albumin 2.8 (*) 3.5 - 5.2 g/dL   AST 20  0 - 37 U/L   ALT 13  0 - 53 U/L   Alkaline Phosphatase 140 (*) 39 - 117 U/L   Total Bilirubin 0.5  0.3 - 1.2 mg/dL   GFR calc non Af Amer 31 (*) >90 mL/min   GFR calc Af Amer 36 (*) >90 mL/min  PRO B NATRIURETIC PEPTIDE      Result Value Range   Pro B Natriuretic peptide (BNP) 19449.0 (*) 0 - 450 pg/mL  TROPONIN I      Result Value Range   Troponin I <0.30  <0.30 ng/mL  PROTIME-INR      Result Value Range   Prothrombin Time 14.5  11.6 - 15.2 seconds   INR 1.15  0.00 - 1.49  OCCULT BLOOD, POC DEVICE      Result Value Range   Fecal Occult Bld POSITIVE (*) NEGATIVE  TYPE AND SCREEN      Result Value Range   ABO/RH(D) O NEG     Antibody Screen NEG     Sample Expiration 04/19/2013     Unit Number [id number]     Blood Component Type RED CELLS,LR     Unit division 00     Status of Unit ALLOCATED     Transfusion Status OK TO TRANSFUSE     Crossmatch Result Compatible     Unit Number [id number]     Blood Component Type RED CELLS,LR     Unit division 00     Status of Unit ALLOCATED     Transfusion Status OK TO TRANSFUSE     Crossmatch Result Compatible    ABO/RH      Result Value Range   ABO/RH(D) O NEG    PREPARE RBC (CROSSMATCH)      Result Value Range   Order Confirmation ORDER PROCESSED BY BLOOD BANK        Radiological Exams on Admission: Dg Chest Port 1 View  04/16/2013   *RADIOLOGY REPORT*  Clinical Data: Cough.  PORTABLE CHEST - 1 VIEW  Comparison: None.  Findings: The heart is enlarged.  There are surgical changes from bypass surgery.  There is tortuosity and calcification of the thoracic aorta.  There are increased interstitial markings and peribronchial thickening which could reflect chronic bronchitic changes related to smoking. Mild elevation of the left hemidiaphragm with overlying vascular crowding and atelectasis.  No edema or definite effusions.   The bony thorax is intact.  IMPRESSION:  1.  Cardiac enlargement. 2.  Probable chronic bronchitic type lung changes.  No definite infiltrates or effusions. 3.  Left basilar atelectasis.   Original Report Authenticated By: Rudie Meyer, M.D.      Orders placed during the hospital encounter of 04/16/13  . ED EKG  . ED EKG  . EKG 12-LEAD  . EKG 12-LEAD     Assessment/Plan Principal Problem:   Lower GI bleed Active Problems:   Atrial fibrillation   Volume overload   Acute combined systolic and diastolic congestive heart failure   CKD (chronic kidney disease) stage 3, GFR 30-59 ml/min   Anemia   CAD (coronary artery disease) of artery bypass graft   HTN (hypertension)  BRBPR/LGIB and Sxatic Anemia in pt who has known UC.  Last Colon I have is 05/2006.  Last seen by Dr Randa Evens was 07/2010 according to my notes.  Admit to Tele.  Transfuse.  Watch Hbg.  I already consulted Dr Bosie Clos.  Hold on any immunosuppressives for now.  No Lovenox.  He stopped the Coumadin a little while ago.  Supportive Care. Protonix although does not seem like an UGIB.  Vol Overload And CHF Sxs c  Venous insufficiency and Dermatitis.  No Pulm Edema on CXR. Diurese and get 2D ECHO.  Consult Dr Hazle Coca group. last ECHO 2008 showed EF 45% c Mod Pulm HTN.  Last Myoview was 2010 and Non-Ischemic.  Known AFib. Last saw Dr Allyson Sabal 04/2012 and last coumadin check was 12/2012 prior to D/cing it. Less weeping, less edema Overall improved from our office with doxy and lasix and wraps Consult wound care  CHRONIC KIDNEY DISEASE STAGE III  - Creat 1.8 - 2.1. Follow Cr   ANEMIA  Transfuse, Aranesp 80 and iron.    Hgb's 7.8-8.6 and was scheduled for outpt Transfusions. Clearly we will do them inpatient.  ATRIAL FIBRILLATION - per cards.  EKG shows Afib Off Coumadin. INR confirms this Hold ASA c GIB  CORONARY ARTERY DISEASE/s/p CABG  - Dr Allyson Sabal. No Angina.  HYPERTENSION - Diurese as he is HD stable  DVT proph - Squeezers if  he can tolerate. I ordered PT/OT/CW/Wound care to help pt.  Will discuss and recommend DNR  Cleaven Demario M 04/16/2013, 1:42 PM

## 2013-04-16 NOTE — Progress Notes (Signed)
Pt confirms pcp is Creola Corn  EPIC updated

## 2013-04-16 NOTE — ED Provider Notes (Addendum)
History     CSN: [id number]  Arrival date & time 04/16/13  4098   First MD Initiated Contact with Patient 04/16/13 [phone]      Chief Complaint  Patient presents with  . Rectal Bleeding    (Consider location/radiation/quality/duration/timing/severity/associated sxs/prior treatment) HPI Comments: Patient presents to the emergency department for evaluation of rectal bleeding. Patient reports that he wears a diaper because he is incontinent. This morning he noticed dark clots in his diaper. He went to the bathroom and passed more clots and then bright red blood. Patient denies abdominal pain. He reports that he has had a history of bleeding secondary to Coumadin, has been off the Coumadin for about 3 weeks.  Patient also reports a lot of phlegm in his throat. He has had slight shortness of breath. Patient reports that his legs are chronically swollen, but they have been more swollen than usual and are weeping fluid. The right leg is worse than the left.  Patient is a 79 y.o. male presenting with hematochezia.  Rectal Bleeding Associated symptoms: no abdominal pain     Past Medical History  Diagnosis Date  . Hypertension   . History of prostate cancer 1996    treated with radiation  . Arthritis   . Gout, arthritis 2010    bil feet    Past Surgical History  Procedure Laterality Date  . Coronary artery bypass graft  04/1995  . Appendectomy  1950s  . Eye surgery  2009    cataract surgery    No family history on file.  History  Substance Use Topics  . Smoking status: Former Smoker    Quit date: 09/26/1984  . Smokeless tobacco: Not on file  . Alcohol Use: No      Review of Systems  Respiratory: Positive for cough and shortness of breath.   Cardiovascular: Positive for leg swelling. Negative for chest pain.  Gastrointestinal: Positive for hematochezia and anal bleeding. Negative for abdominal pain.  All other systems reviewed and are negative.    Allergies   Penicillins cross reactors and Sulfa antibiotics  Home Medications   Current Outpatient Rx  Name  Route  Sig  Dispense  Refill  . calcium carbonate (TUMS - DOSED IN MG ELEMENTAL CALCIUM) 500 MG chewable tablet   Oral   Chew 1 tablet by mouth as needed.         . colchicine 0.6 MG tablet   Oral   Take 0.6 mg by mouth daily.           Marland Kitchen lovastatin (MEVACOR) 40 MG tablet   Oral   Take 40 mg by mouth at bedtime.           . metoprolol (LOPRESSOR) 50 MG tablet   Oral   Take 50 mg by mouth 2 (two) times daily.           . valsartan-hydrochlorothiazide (DIOVAN-HCT) 80-12.5 MG per tablet   Oral   Take 1 tablet by mouth daily.           Marland Kitchen warfarin (COUMADIN) 5 MG tablet   Oral   Take 5 mg by mouth daily. 5 mg Mon, Wed, and Fri 2.5 mg on all other days. Goes to coumadin clinic            There were no vitals taken for this visit.  Physical Exam  Constitutional: He is oriented to person, place, and time. He appears well-developed and well-nourished. No distress.  HENT:  Head: Normocephalic and  atraumatic.  Right Ear: Hearing normal.  Left Ear: Hearing normal.  Nose: Nose normal.  Mouth/Throat: Oropharynx is clear and moist and mucous membranes are normal.  Eyes: Conjunctivae and EOM are normal. Pupils are equal, round, and reactive to light.  Neck: Normal range of motion. Neck supple.  Cardiovascular: Regular rhythm, S1 normal and S2 normal.  Exam reveals no gallop and no friction rub.   No murmur heard. Pulmonary/Chest: Effort normal and breath sounds normal. No respiratory distress. He exhibits no tenderness.  Abdominal: Soft. Normal appearance and bowel sounds are normal. There is no hepatosplenomegaly. There is no tenderness. There is no rebound, no guarding, no tenderness at McBurney's point and negative Murphy's sign. No hernia.  Genitourinary:   Large amount of red clots present in diaper  Musculoskeletal: Normal range of motion. He exhibits edema.   Significant pitting edema bilateral legs, right > left  Neurological: He is alert and oriented to person, place, and time. He has normal strength. No cranial nerve deficit or sensory deficit. Coordination normal. GCS eye subscore is 4. GCS verbal subscore is 5. GCS motor subscore is 6.  Skin: Skin is warm, dry and intact. No rash noted. No cyanosis.  Bilateral legs weeping serous fluid, scabs and evidence of ongoing edema. Erythema present.  Psychiatric: He has a normal mood and affect. His speech is normal and behavior is normal. Thought content normal.    ED Course  Procedures (including critical care time)  EKG:  Date: 04/16/2013  Rate: 55  Rhythm: atrial fibrillation and premature ventricular contractions (PVC)  QRS Axis: left  Intervals: normal  ST/T Wave abnormalities: normal  Conduction Disutrbances:inc RBBB, LAFB  Narrative Interpretation:   Old EKG Reviewed: none available    Labs Reviewed  CBC WITH DIFFERENTIAL - Abnormal; Notable for the following:    RBC 2.97 (*)    Hemoglobin 8.0 (*)    HCT 25.3 (*)    RDW 19.0 (*)    All other components within normal limits  COMPREHENSIVE METABOLIC PANEL - Abnormal; Notable for the following:    Glucose, Bld 111 (*)    BUN 51 (*)    Creatinine, Ser 1.82 (*)    Albumin 2.8 (*)    Alkaline Phosphatase 140 (*)    GFR calc non Af Amer 31 (*)    GFR calc Af Amer 36 (*)    All other components within normal limits  PRO B NATRIURETIC PEPTIDE - Abnormal; Notable for the following:    Pro B Natriuretic peptide (BNP) 19449.0 (*)    All other components within normal limits  OCCULT BLOOD, POC DEVICE - Abnormal; Notable for the following:    Fecal Occult Bld POSITIVE (*)    All other components within normal limits  TROPONIN I  PROTIME-INR  TYPE AND SCREEN   Dg Chest Port 1 View  04/16/2013   *RADIOLOGY REPORT*  Clinical Data: Cough.  PORTABLE CHEST - 1 VIEW  Comparison: None.  Findings: The heart is enlarged.  There are  surgical changes from bypass surgery.  There is tortuosity and calcification of the thoracic aorta.  There are increased interstitial markings and peribronchial thickening which could reflect chronic bronchitic changes related to smoking. Mild elevation of the left hemidiaphragm with overlying vascular crowding and atelectasis.  No edema or definite effusions.  The bony thorax is intact.  IMPRESSION:  1.  Cardiac enlargement. 2.  Probable chronic bronchitic type lung changes.  No definite infiltrates or effusions. 3.  Left basilar atelectasis.  Original Report Authenticated By: Rudie Meyer, M.D.     Diagnosis: 1. GI bleed 2. Volume overload    MDM  Patient presents to the ER for evaluation of rectal bleeding. Patient woke this morning with blood clots in his diaper and had passage of additional clots of bright red blood. Examination revealed the presence of clots and gross blood. Patient's vital signs stable other than elevated blood pressure. Patient's CBC shows anemia with a hemoglobin of 8.0. This is a drop from the patient's baseline.  In addition to the GI bleeding, patient reports increasing swelling of his legs, right greater than left. Patient is experiencing serosanguineous leakage from both legs. There is erythema associated with the swelling. It is not clear if this is dermatitis with swelling, cannot rule out cellulitis. He has been slightly short of breath. Chest x-ray does not show any overt pulmonary edema, but BNP is 19,449. Patient likely having some element of congestive heart failure which will complicate blood transfusion if necessary.  Patient will be hospitalized for further management.        Gilda Crease, MD 04/16/13 1003  Gilda Crease, MD 04/16/13 1006

## 2013-04-16 NOTE — Consult Note (Signed)
Reason for Consult:CHF Referring Physician: TRH   HPI: The patient is a 79 y/o male, followed at [facility] by Dr. Allyson Sabal. He has a history of CAD, s/p CABG in 1996. He his last cath was in 2006 and his grafts at that time were patent. He also has chronic atrial fibrillation, on Coumadin for AC, HTN and HLD, as well as renal insufficiency. He presented to the [facility] ER today with a complaint of hematochezia. Examination in the ER revealed the presence of clots and gross blood. His Hgb was 8.0.  He has had recent anemia with Hgb of 7.9, necessitating transfusion. He was admitted by [facility] for GI bleed and his Coumadin has temporarily been discontinued. The patient was also noted to have significant bilateral, wheeping lower extremity and evidence of volume overload. His Pro BNP was over 19K. [facility] has been consulted for CHF. The patient reports progressively worsening LEE x 2 weeks. He denies significant SOB, PND or orthopnea. No chest pain or palpitations. He reports compliance with his medications, but does not adhere to a low sodium diet.   Past Medical History  Diagnosis Date  . Hypertension   . History of prostate cancer 1996    treated with radiation  . Arthritis   . Gout, arthritis 2010    bil feet  . Cancer     PORSTATE-HX OF RADIATION AND SURGERY  . Diverticulosis of colon     Past Surgical History  Procedure Laterality Date  . Coronary artery bypass graft  04/1995  . Appendectomy  1950s  . Eye surgery  2009    cataract surgery  . Cryoablation of prostate  06/23/2005  . Colonoscopy  11/03/2000    History reviewed. No pertinent family history.  Social History:  reports that he quit smoking about 28 years ago. He has never used smokeless tobacco. He reports that he does not drink alcohol or use illicit drugs.  Allergies:  Allergies  Allergen Reactions  . Ancef (Cefazolin) Anaphylaxis  . Penicillins Cross Reactors     Anaphylaxix/don't give anything in the "family" of penicillins  . Sulfa  Antibiotics     Don't remember. Didn't want to give me anymore    Medications:  Prior to Admission:  Prescriptions prior to admission  Medication Sig Dispense Refill  . aspirin 81 MG tablet Take 81 mg by mouth daily.      . calcium carbonate (TUMS - DOSED IN MG ELEMENTAL CALCIUM) 500 MG chewable tablet Chew 1 tablet by mouth as needed.      . furosemide (LASIX) 40 MG tablet Take 40 mg by mouth daily.      Marland Kitchen lovastatin (MEVACOR) 40 MG tablet Take 40 mg by mouth at bedtime.        . metoprolol (LOPRESSOR) 50 MG tablet Take 50 mg by mouth 2 (two) times daily.        . valsartan (DIOVAN) 80 MG tablet Take 80 mg by mouth daily.      . valsartan-hydrochlorothiazide (DIOVAN-HCT) 80-12.5 MG per tablet Take 1 tablet by mouth daily.          Results for orders placed during the hospital encounter of 04/16/13 (from the past 48 hour(s))  OCCULT BLOOD, POC DEVICE     Status: Abnormal   Collection Time    04/16/13  8:16 AM      Result Value Range   Fecal Occult Bld POSITIVE (*) NEGATIVE  CBC WITH DIFFERENTIAL     Status: Abnormal   Collection Time  04/16/13  8:54 AM      Result Value Range   WBC 8.8  4.0 - 10.5 K/uL   RBC 2.97 (*) 4.22 - 5.81 MIL/uL   Hemoglobin 8.0 (*) 13.0 - 17.0 g/dL   HCT 78.2 (*) 95.6 - 21.3 %   MCV 85.2  78.0 - 100.0 fL   MCH 26.9  26.0 - 34.0 pg   MCHC 31.6  30.0 - 36.0 g/dL   RDW 08.6 (*) 57.8 - 46.9 %   Platelets 208  150 - 400 K/uL   Neutrophils Relative % 76  43 - 77 %   Neutro Abs 6.7  1.7 - 7.7 K/uL   Lymphocytes Relative 18  12 - 46 %   Lymphs Abs 1.6  0.7 - 4.0 K/uL   Monocytes Relative 5  3 - 12 %   Monocytes Absolute 0.4  0.1 - 1.0 K/uL   Eosinophils Relative 1  0 - 5 %   Eosinophils Absolute 0.0  0.0 - 0.7 K/uL   Basophils Relative 0  0 - 1 %   Basophils Absolute 0.0  0.0 - 0.1 K/uL  COMPREHENSIVE METABOLIC PANEL     Status: Abnormal   Collection Time    04/16/13  8:54 AM      Result Value Range   Sodium 140  135 - 145 mEq/L   Potassium 4.1   3.5 - 5.1 mEq/L   Chloride 105  96 - 112 mEq/L   CO2 27  19 - 32 mEq/L   Glucose, Bld 111 (*) 70 - 99 mg/dL   BUN 51 (*) 6 - 23 mg/dL   Creatinine, Ser 6.29 (*) 0.50 - 1.35 mg/dL   Calcium 9.0  8.4 - 52.8 mg/dL   Total Protein 8.2  6.0 - 8.3 g/dL   Albumin 2.8 (*) 3.5 - 5.2 g/dL   AST 20  0 - 37 U/L   ALT 13  0 - 53 U/L   Alkaline Phosphatase 140 (*) 39 - 117 U/L   Total Bilirubin 0.5  0.3 - 1.2 mg/dL   GFR calc non Af Amer 31 (*) >90 mL/min   GFR calc Af Amer 36 (*) >90 mL/min   Comment:            The eGFR has been calculated     using the CKD EPI equation.     This calculation has not been     validated in all clinical     situations.     eGFR's persistently     <90 mL/min signify     possible Chronic Kidney Disease.  PRO B NATRIURETIC PEPTIDE     Status: Abnormal   Collection Time    04/16/13  8:54 AM      Result Value Range   Pro B Natriuretic peptide (BNP) 19449.0 (*) 0 - 450 pg/mL  TROPONIN I     Status: None   Collection Time    04/16/13  8:54 AM      Result Value Range   Troponin I <0.30  <0.30 ng/mL   Comment:            Due to the release kinetics of cTnI,     a negative result within the first hours     of the onset of symptoms does not rule out     myocardial infarction with certainty.     If myocardial infarction is still suspected,     repeat the test at appropriate intervals.  PROTIME-INR     Status: None   Collection Time    04/16/13  8:54 AM      Result Value Range   Prothrombin Time 14.5  11.6 - 15.2 seconds   INR 1.15  0.00 - 1.49  TYPE AND SCREEN     Status: None   Collection Time    04/16/13  8:54 AM      Result Value Range   ABO/RH(D) O NEG     Antibody Screen NEG     Sample Expiration 04/19/2013     Unit Number [id number]     Blood Component Type RED CELLS,LR     Unit division 00     Status of Unit ISSUED     Transfusion Status OK TO TRANSFUSE     Crossmatch Result Compatible     Unit Number [id number]     Blood Component Type  RED CELLS,LR     Unit division 00     Status of Unit ALLOCATED     Transfusion Status OK TO TRANSFUSE     Crossmatch Result Compatible    ABO/RH     Status: None   Collection Time    04/16/13  9:54 AM      Result Value Range   ABO/RH(D) O NEG    PREPARE RBC (CROSSMATCH)     Status: None   Collection Time    04/16/13 12:00 PM      Result Value Range   Order Confirmation ORDER PROCESSED BY BLOOD BANK      Dg Chest Port 1 View  04/16/2013   *RADIOLOGY REPORT*  Clinical Data: Cough.  PORTABLE CHEST - 1 VIEW  Comparison: None.  Findings: The heart is enlarged.  There are surgical changes from bypass surgery.  There is tortuosity and calcification of the thoracic aorta.  There are increased interstitial markings and peribronchial thickening which could reflect chronic bronchitic changes related to smoking. Mild elevation of the left hemidiaphragm with overlying vascular crowding and atelectasis.  No edema or definite effusions.  The bony thorax is intact.  IMPRESSION:  1.  Cardiac enlargement. 2.  Probable chronic bronchitic type lung changes.  No definite infiltrates or effusions. 3.  Left basilar atelectasis.   Original Report Authenticated By: Rudie Meyer, M.D.    Review of Systems  Constitutional: Negative for fever, chills, malaise/fatigue and diaphoresis.  HENT: Negative for sore throat.   Eyes: Negative for blurred vision, double vision and pain.  Respiratory: Positive for cough and sputum production. Negative for shortness of breath and wheezing.   Cardiovascular: Positive for claudication and leg swelling. Negative for chest pain, palpitations, orthopnea and PND.  Gastrointestinal: Positive for blood in stool and melena. Negative for nausea, vomiting, abdominal pain, diarrhea and constipation.  Genitourinary: Negative for dysuria, urgency, frequency and hematuria.  Musculoskeletal: Negative for myalgias and back pain.  Skin: Negative for itching and rash.  Neurological: Positive  for weakness. Negative for dizziness, seizures, loss of consciousness and headaches.   Blood pressure 153/66, pulse 56, temperature 97.6 F (36.4 C), temperature source Oral, resp. rate 20, height 5\' 6"  (1.676 m), SpO2 100.00%. Physical Exam  Constitutional: He is oriented to person, place, and time. He appears well-developed and well-nourished.  HENT:  Head: Normocephalic and atraumatic.  Eyes: Conjunctivae and EOM are normal. Pupils are equal, round, and reactive to light.  Neck: Hepatojugular reflux and JVD (at the level of the ear) present. Carotid bruit is not present. No thyromegaly present.  Cardiovascular: Normal rate  and intact distal pulses.  An irregularly irregular rhythm present. Exam reveals no gallop and no friction rub.   No murmur heard. Pulses:      Radial pulses are 2+ on the right side, and 2+ on the left side.       Dorsalis pedis pulses are 1+ on the right side, and 1+ on the left side.  Respiratory: Effort normal. No respiratory distress. He has wheezes. He has no rales. He exhibits no tenderness.  GI: Soft. Bowel sounds are normal. He exhibits no distension and no mass. There is no tenderness.  Musculoskeletal: He exhibits edema (2+ bilaterally).  Lymphadenopathy:    He has no cervical adenopathy.  Neurological: He is alert and oriented to person, place, and time.  Skin: Skin is warm and dry.  Psychiatric: He has a normal mood and affect. His behavior is normal.    Assessment/Plan: Principal Problem:   Lower GI bleed Active Problems:   Atrial fibrillation   Volume overload   Acute combined systolic and diastolic congestive heart failure   CKD (chronic kidney disease) stage 3, GFR 30-59 ml/min   Anemia   CAD (coronary artery disease) of artery bypass graft   HTN (hypertension)  Plan: Pt admitted w/ GI bleed. Agree with holding Coumadin. A-fib is rate controlled. Pt also has acute CHF. BNP is 19,449 w/ + bilateral peripheral edema and JVD. I agree with  diuresis with IV Lasix, however will need to closely monitor renal function. SCr is 1.82. Will need to follow with strict I/Os and daily weights. If patient is to be on a clear liquid diet, make sure it is low sodium. Will need to recheck BNP in 2 days. Monitor electrolytes and replete as needed. Will obtain office records. His last echo was apparently in 2008 and his EF at that time was 45%. His last Myoview was in 2010 and apparently was non-ischemic. Will need to re-order a 2 D echo to assess systolic and diastolic function and to look for possible wall motion abnormalities. Dr. Allyson Sabal, his primary cardiologist to follow and will provide further recommendation. Will continue to follow along.   Allayne Butcher, PA-C 04/16/2013, 4:15 PM     Agree with note written by Boyce Medici  PAC  Pt well known to me with H/O CAD, CABG, CAF and mod LV dysfunction (EF 45%). Pt admitted with LGIB/Hematochezia. Currently getting transfused. INR sub therapeutic at 1.1. Exam benign. Soft outflow murmur, 1-2+ pitting edema with venous stasis changes. BNP increased to 19K. Agree with 2D, iv diuresis and conservative Rx of GIB. Approaching DNR would be appropriate. Will follow wit you.    Runell Gess 04/16/2013 4:59 PM

## 2013-04-16 NOTE — ED Notes (Signed)
Per EMS- Patient noted a dark brown clot in his diaper and when he went to the toilet and noted that he had afew more dark clots followed by bright red clots in the toilet. Patient has weeping edema to bilateral lower extremities and ws recently started on Lasix.

## 2013-04-16 NOTE — Progress Notes (Signed)
  Echocardiogram 2D Echocardiogram has been performed.  [name] [name] 04/16/2013, 2:30 PM

## 2013-04-17 LAB — BASIC METABOLIC PANEL
CO2: 29 mEq/L (ref 19–32)
Chloride: 105 mEq/L (ref 96–112)
Glucose, Bld: 93 mg/dL (ref 70–99)
Potassium: 3.9 mEq/L (ref 3.5–5.1)
Sodium: 142 mEq/L (ref 135–145)

## 2013-04-17 LAB — TYPE AND SCREEN: Unit division: 0

## 2013-04-17 LAB — CBC
HCT: 27.9 % — ABNORMAL LOW (ref 39.0–52.0)
Hemoglobin: 9 g/dL — ABNORMAL LOW (ref 13.0–17.0)
MCH: 27.3 pg (ref 26.0–34.0)
MCV: 84.5 fL (ref 78.0–100.0)
RBC: 3.3 MIL/uL — ABNORMAL LOW (ref 4.22–5.81)

## 2013-04-17 MED ORDER — DARBEPOETIN ALFA-POLYSORBATE 100 MCG/0.5ML IJ SOLN
80.0000 ug | Freq: Once | INTRAMUSCULAR | Status: AC
  Administered 2013-04-18: 80 ug via SUBCUTANEOUS
  Filled 2013-04-17: qty 0.5

## 2013-04-17 MED ORDER — FERUMOXYTOL INJECTION 510 MG/17 ML
510.0000 mg | Freq: Once | INTRAVENOUS | Status: AC
  Administered 2013-04-17: 510 mg via INTRAVENOUS
  Filled 2013-04-17: qty 17

## 2013-04-17 MED ORDER — FUROSEMIDE 10 MG/ML IJ SOLN
40.0000 mg | Freq: Every day | INTRAMUSCULAR | Status: DC
  Administered 2013-04-17: 40 mg via INTRAVENOUS
  Filled 2013-04-17 (×2): qty 4

## 2013-04-17 NOTE — Progress Notes (Signed)
Subjective: 29 male c CAD/CABG, CHF, CKD, and UC admitted yesterday c Rectal Bleeding, Sxatic anemia and acute on chronic CHF/Vol Overload. S/P 2U PRBCs and Hbg up 1 gram. Bleeding has stopped. Feet pains controlled c Tylenol. Breathing is stable. He agrees to DNR. He is hungry.  Objective: Vital signs in last 24 hours: Temp:  [97.4 F (36.3 C)-98 F (36.7 C)] 98 F (36.7 C) (06/18 0600) Pulse Rate:  [53-63] 57 (06/18 0600) Resp:  [20-26] 20 (06/18 0600) BP: (132-177)/(61-84) 132/68 mmHg (06/18 0600) SpO2:  [96 %-100 %] 97 % (06/18 0600) Weight change:     CBG (last 3)  No results found for this basename: GLUCAP,  in the last 72 hours  Intake/Output from previous day:  Intake/Output Summary (Last 24 hours) at 04/17/13 0722 Last data filed at 04/17/13 1610  Gross per 24 hour  Intake 1493.08 ml  Output   2075 ml  Net -581.92 ml   06/17 0701 - 06/18 0700 In: 1493.1 [P.O.:840; Blood:653.1] Out: 1400 [Urine:1400]   Physical Exam General appearance: looks better today Head: Normocephalic, without obvious abnormality, atraumatic  Eyes: conjunctivae/corneas clear. PERRL, EOM's intact.  Nose: Nares normal. Septum midline. Mucosa normal. No drainage or sinus tenderness.  Throat: lips, mucosa, and tongue normal; teeth and gums normal  Neck: Less JVD  Resp: Mostly clear  Cardio: irreg, sternotomy, m  GI: soft, non-tender; bowel sounds normal; no masses, no organomegaly  Extremities: 1+ edema, excoriations and erythema better.  L shin dressed. Dermatitis s Celluilitis at this time. No weaping this am Pulses: 1+ and symmetric  Lymph nodes: no cervical lymphadenopathy  Neurologic: Alert and oriented X 3, normal strength and tone. Normal symmetric reflexes.     Lab Results:  Recent Labs  04/16/13 0854 04/17/13 0607  NA 140 142  K 4.1 3.9  CL 105 105  CO2 27 29  GLUCOSE 111* 93  BUN 51* 43*  CREATININE 1.82* 1.91*  CALCIUM 9.0 8.9     Recent Labs   04/16/13 0854  AST 20  ALT 13  ALKPHOS 140*  BILITOT 0.5  PROT 8.2  ALBUMIN 2.8*     Recent Labs  04/16/13 0854 04/17/13 0607  WBC 8.8 7.3  NEUTROABS 6.7  --   HGB 8.0* 9.0*  HCT 25.3* 27.9*  MCV 85.2 84.5  PLT 208 169    Lab Results  Component Value Date   INR 1.15 04/16/2013     Recent Labs  04/16/13 0854  TROPONINI <0.30    No results found for this basename: TSH, T4TOTAL, FREET3, T3FREE, THYROIDAB,  in the last 72 hours  No results found for this basename: VITAMINB12, FOLATE, FERRITIN, TIBC, IRON, RETICCTPCT,  in the last 72 hours  Micro Results: No results found for this or any previous visit (from the past 240 hour(s)).   Studies/Results: Dg Chest Port 1 View  04/16/2013   *RADIOLOGY REPORT*  Clinical Data: Cough.  PORTABLE CHEST - 1 VIEW  Comparison: None.  Findings: The heart is enlarged.  There are surgical changes from bypass surgery.  There is tortuosity and calcification of the thoracic aorta.  There are increased interstitial markings and peribronchial thickening which could reflect chronic bronchitic changes related to smoking. Mild elevation of the left hemidiaphragm with overlying vascular crowding and atelectasis.  No edema or definite effusions.  The bony thorax is intact.  IMPRESSION:  1.  Cardiac enlargement. 2.  Probable chronic bronchitic type lung changes.  No definite infiltrates or effusions. 3.  Left basilar atelectasis.   Original Report Authenticated By: Rudie Meyer, M.D.     Medications: Scheduled: . doxycycline  100 mg Oral Q12H  . furosemide  40 mg Intravenous BID  . irbesartan  150 mg Oral Daily  . metoprolol  50 mg Oral BID  . pantoprazole  40 mg Oral BID AC  . simvastatin  20 mg Oral q1800  . sodium chloride  3 mL Intravenous Q12H   Continuous:    Assessment/Plan: Principal Problem:   Lower GI bleed Active Problems:   Atrial fibrillation   Volume overload   Acute combined systolic and diastolic congestive heart  failure   CKD (chronic kidney disease) stage 3, GFR 30-59 ml/min   Anemia   CAD (coronary artery disease) of artery bypass graft   HTN (hypertension)   BRBPR/LGIB and Sxatic Anemia in pt who has known UC. Bleeding seems stopped.  Follow. Last Colon was 05/2006. S/P 2 units PRBCs and Hbg up 1 gm compared to yesterday.  Appreciate Dr Louis Matte help and he believes the bleed is Diverticular. Hold on any immunosuppressives. If bleeding returns with abdominal pain or diarrhea or bleeding persists then may need to add Rowasa enemas or PO mesalamine. No Lovenox/Coumadin/ASA. Supportive Care. Protonix.  Vol Overload due to CHF Systolic/Diastolic. Severe Valvular Dz c Severe Pul HTN.  Venous insufficiency and Stasis Dermatitis. No Pulm Edema on CXR. Continue to  Diurese and follow Cr. Appreciate Dr Hazle Coca input. ECHO 2008 showed EF 45% c Mod Pulm HTN. Last Myoview was 2010 and Non-Ischemic. Known AFib. Current ECHO shows - EF 55% to 60%. Diastolic Dysfunction and Severe Pulmonary HTN, moderate Aortic stenosis. Mod MR, LA/RA Severely Dillitated, TR. Less weeping, less edema  Continue doxy and lasix and wraps  Wound care made recs and signed off: cover open areas with a soft silicone foam dressing, elevate LEs both in and out of bed. Recommend (as erythema and edema resolve) that we consider 2 or 4-layer compression wraps as part of the POC. HHRN can apply   CHRONIC KIDNEY DISEASE STAGE III - Creat 1.8 - 2.1. Stable. Follow Cr. Will continue IV lasix today and switch to oral tomorrow.  Be careful not to overdiurese.  ANEMIA due to chronic Dz, Iron Deficiency and acute blood loss from the GIB. S/P Transfusion, Will give Aranesp 80 and iron and follow Hbg.   ATRIAL FIBRILLATION - per cards. EKG shows Afib  Off Coumadin. Hold ASA c GIB   CORONARY ARTERY DISEASE/s/p CABG - Dr Allyson Sabal. No Angina.   HYPERTENSION - Diurese as he is HD stable   DVT proph - Squeezers if he can tolerate.  Deconditioning -   PT/OT/CW .  Home tomorrow? Now DNR   LOS: 1 day   Jasmine Mcbeth M 04/17/2013, 7:22 AM

## 2013-04-17 NOTE — Progress Notes (Signed)
Clinical Social Work Department BRIEF PSYCHOSOCIAL ASSESSMENT 04/17/2013  Patient:  [name] [name], [name] [name]     Account Number:  [id number]     Admit date:  04/16/2013  Clinical Social Worker:  Dennison Bulla  Date/Time:  04/17/2013 01:45 PM  Referred by:  Physician  Date Referred:  04/17/2013 Referred for  SNF Placement   Other Referral:   Interview type:  Patient Other interview type:    PSYCHOSOCIAL DATA Living Status:  ALONE Admitted from facility:   Level of care:   Primary support name:  Eber Jones Primary support relationship to patient:  CHILD, ADULT Degree of support available:   Strong    CURRENT CONCERNS Current Concerns  Post-Acute Placement   Other Concerns:    SOCIAL WORK ASSESSMENT / PLAN CSW received referral to assist with DC planning. CSW reviewed chart and met with patient and dtr at bedside. CSW introduced myself and explained role.    Patient reports that he lives alone but that friends and family check on him often. Patient reports that he uses a cane and walker at home but feels he is fairly independent. Patient reports he has never been to SNF in the past and feels he can return home without SNF. Patient reports that wife had HH and would be interested in more information regarding HH.    CSW made CM aware of possible HH needs. CSW is signing off but available if further needs arise.   Assessment/plan status:  No Further Intervention Required Other assessment/ plan:   Information/referral to community resources:   SNF information    PATIENT'S/FAMILY'S RESPONSE TO PLAN OF CARE: Patient alert and oriented. Patient declining SNF at this time. Dtr in room and reports that patient can make his own decisions. Dtr agreeable to patient returning home with assistance from friends and family.

## 2013-04-17 NOTE — Evaluation (Signed)
Occupational Therapy Evaluation Patient Details Name: [name] [name] MRN: [id number] DOB: 05-Apr-1921 Today's Date: 04/17/2013 Time: 4098-1191 OT Time Calculation (min): 29 min  OT Assessment / Plan / Recommendation Clinical Impression  This 79 year old man was admitted for lower GI Bleed. At baseline, he is mod I with ADLs.  He has pain in bil feet and began to use RW a few days prior to admission instead of cane.  Pt is appropriate for skilled OT to increase independence with adls.      OT Assessment  Patient needs continued OT Services    Follow Up Recommendations  Home health OT;Supervision/Assistance - 24 hour (if pt refuses snf) Discussed SNF with pt:  He does not want to go anywhere but home.  Daughters check on him daily but would recommend initial 24/7 if pt chooses home.     Barriers to Discharge      Equipment Recommendations   (to be further assessed, ? 3:1 commode:  pt has higher toilet)    Recommendations for Other Services    Frequency  Min 2X/week    Precautions / Restrictions Precautions Precautions: Fall Restrictions Weight Bearing Restrictions: No   Pertinent Vitals/Pain High pain in feet with weight bearing.  Pt had moderate pain in feet in bed.  Repositioned    ADL  Grooming: Set up Where Assessed - Grooming: Unsupported sitting Upper Body Bathing: Set up Where Assessed - Upper Body Bathing: Unsupported sitting Lower Body Bathing: Minimal assistance Where Assessed - Lower Body Bathing: Supported sit to stand Upper Body Dressing: Set up Where Assessed - Upper Body Dressing: Unsupported sitting Lower Body Dressing: Moderate assistance Where Assessed - Lower Body Dressing: Supported sit to Pharmacist, hospital: Minimal Dentist Method: Sit to Barista:  (bed, ambulated, chair) Toileting - Clothing Manipulation and Hygiene: Minimal assistance Where Assessed - Toileting Clothing Manipulation and Hygiene: Sit to  stand from 3-in-1 or toilet Equipment Used: Rolling walker Transfers/Ambulation Related to ADLs: Pt with some wheezing and SOB when walking.  C/O increased foot pain. ADL Comments: Pt lives alone and daughters check on him, one works and one doesn't.  Pt has been very independent prior to this admission.  Spoke to pt about short rehab stay but he is not agreeable to this.  he is not independent with adls:  will educate on AE tomorrow.  Feel he would benefit from 24/7 if he refuses SNF    OT Diagnosis: Generalized weakness  OT Problem List: Pain;Decreased strength;Decreased knowledge of use of DME or AE OT Treatment Interventions: Self-care/ADL training;DME and/or AE instruction;Patient/family education;Energy conservation   OT Goals Acute Rehab OT Goals OT Goal Formulation: With patient Time For Goal Achievement: 05/01/13 Potential to Achieve Goals: Good ADL Goals Pt Will Perform Lower Body Bathing: with supervision;Sit to stand from chair;with adaptive equipment ADL Goal: Lower Body Bathing - Progress: Goal set today Pt Will Perform Lower Body Dressing: with supervision;Sit to stand from chair;with adaptive equipment ADL Goal: Lower Body Dressing - Progress: Goal set today Pt Will Transfer to Toilet: with supervision;Ambulation;Comfort height toilet ADL Goal: Toilet Transfer - Progress: Goal set today Miscellaneous OT Goals Miscellaneous OT Goal #1: Pt will verbalize 3 energy conservation strategies OT Goal: Miscellaneous Goal #1 - Progress: Goal set today  Visit Information  Last OT Received On: 04/17/13    Subjective Data  Subjective: I haven't been out of bed since yesterday Patient Stated Goal: home   Prior Functioning  Home Living Lives With: Alone Type of Home: House Home Access: Ramped entrance;Stairs to enter (front ramp; 3-4 steps in back , R and L hand rails) Home Layout: One level (has upstairs which he doesn't use) Bathroom Shower/Tub:  (sponge  bathes) Bathroom Toilet: Handicapped height (17 1/2") Home Adaptive Equipment: Straight cane;Walker - rolling Prior Function Level of Independence: Independent with assistive device(s) Driving: Yes Comments: usually uses cane; used RW for past few days Communication Communication: HOH         Vision/Perception     Cognition  Cognition Arousal/Alertness: Awake/alert Behavior During Therapy: WFL for tasks assessed/performed Overall Cognitive Status: Within Functional Limits for tasks assessed    Extremity/Trunk Assessment Right Upper Extremity Assessment RUE ROM/Strength/Tone: Within functional levels Left Upper Extremity Assessment LUE ROM/Strength/Tone: Within functional levels     Mobility Bed Mobility Bed Mobility: Supine to Sit Supine to Sit: 4: Min assist (cues for technique) Transfers Transfers: Sit to Stand;Stand to Sit Sit to Stand: 4: Min assist Stand to Sit: 4: Min assist Details for Transfer Assistance: assist to rise and steady     Exercise     Balance     End of Session OT - End of Session Activity Tolerance: Patient tolerated treatment well Patient left: in chair;with call bell/phone within reach;with family/visitor present  GO     [name] [name] 04/17/2013, 4:19 PM Marica Otter, OTR/L [phone] 04/17/2013

## 2013-04-17 NOTE — Progress Notes (Signed)
Met with pt and daughter to discuss d/c planning. He stated that his wife has used [facility] in the past and would like to use them if home health services are needed. PT/OT consults are pending and will help determine d/c needs. I will continue to follow.   Algernon Huxley RN BSN   [phone]

## 2013-04-17 NOTE — Evaluation (Signed)
Physical Therapy Evaluation Patient Details Name: [name] [name] MRN: [id number] DOB: 1921-10-11 Today's Date: 04/17/2013 Time: 1478-2956 PT Time Calculation (min): 20 min  PT Assessment / Plan / Recommendation Clinical Impression  Pt admitted with GIB and Bil edemetous/weeping LEs presents with functional mobility limited by decreased endurance, ambulatory balance deficits, and Bil LE discomfort.    PT Assessment  Patient needs continued PT services    Follow Up Recommendations  Home health PT;SNF;Supervision/Assistance - 24 hour (Dependent on acute stay progress & level of home assist avai)    Does the patient have the potential to tolerate intense rehabilitation      Barriers to Discharge Decreased caregiver support Pt is home alone    Equipment Recommendations  None recommended by PT    Recommendations for Other Services OT consult   Frequency Min 3X/week    Precautions / Restrictions Precautions Precautions: Fall Restrictions Weight Bearing Restrictions: No   Pertinent Vitals/Pain 7+/10 Bil feet (R>L)      Mobility  Bed Mobility Bed Mobility: Supine to Sit Supine to Sit: 4: Min assist Details for Bed Mobility Assistance: min cues for technique Transfers Transfers: Sit to Stand;Stand to Sit Sit to Stand: 4: Min assist Stand to Sit: 4: Min assist Details for Transfer Assistance: assist to rise and steady; cues for LE management and use of UEs to self assist Ambulation/Gait Ambulation/Gait Assistance: 4: Min assist Ambulation Distance (Feet): 100 Feet Assistive device: Rolling walker Ambulation/Gait Assistance Details: cues for posture and position from RW Gait Pattern: Step-to pattern;Step-through pattern General Gait Details: Pt required several standing rests to complete task 2* fatigue and c/o foot discomfort Stairs: No    Exercises     PT Diagnosis: Difficulty walking  PT Problem List: Decreased activity tolerance;Decreased balance;Decreased  mobility;Pain;Decreased knowledge of use of DME PT Treatment Interventions: DME instruction;Gait training;Functional mobility training;Therapeutic activities;Therapeutic exercise;Patient/family education   PT Goals Acute Rehab PT Goals PT Goal Formulation: With patient Time For Goal Achievement: 04/24/13 Potential to Achieve Goals: Fair Pt will go Supine/Side to Sit: with modified independence PT Goal: Supine/Side to Sit - Progress: Goal set today Pt will go Sit to Supine/Side: with modified independence PT Goal: Sit to Supine/Side - Progress: Goal set today Pt will go Sit to Stand: with modified independence PT Goal: Sit to Stand - Progress: Goal set today Pt will go Stand to Sit: with modified independence PT Goal: Stand to Sit - Progress: Goal set today Pt will Ambulate: 51 - 150 feet;with modified independence;with rolling walker PT Goal: Ambulate - Progress: Goal set today  Visit Information  Last PT Received On: 04/17/13 Assistance Needed: +1 PT/OT Co-Evaluation/Treatment: Yes    Subjective Data  Subjective: I've been in bed for days and my feet hurt a lot Patient Stated Goal: Home   Prior Functioning  Home Living Lives With: Alone Type of Home: House Home Access: Ramped entrance;Stairs to enter Home Layout: One level Bathroom Shower/Tub:  (sponge bathes) Bathroom Toilet: Handicapped height Home Adaptive Equipment: Straight cane;Walker - rolling Prior Function Level of Independence: Independent with assistive device(s) Driving: Yes Comments: typically uses cane but using RW last several days Communication Communication: HOH    Cognition  Cognition Arousal/Alertness: Awake/alert Behavior During Therapy: WFL for tasks assessed/performed Overall Cognitive Status: Within Functional Limits for tasks assessed    Extremity/Trunk Assessment Right Upper Extremity Assessment RUE ROM/Strength/Tone: Within functional levels Left Upper Extremity Assessment LUE  ROM/Strength/Tone: Within functional levels Right Lower Extremity Assessment RLE ROM/Strength/Tone: [facility] for tasks assessed Left  Lower Extremity Assessment LLE ROM/Strength/Tone: WFL for tasks assessed (only tested to 3/5 strength 2* pt c/o increased discomfort R)   Balance    End of Session PT - End of Session Equipment Utilized During Treatment: Gait belt Activity Tolerance: Patient tolerated treatment well;Patient limited by pain;Patient limited by fatigue Patient left: in chair;with call bell/phone within reach;with family/visitor present Nurse Communication: Mobility status  GP     Nazariah Cadet 04/17/2013, 5:48 PM

## 2013-04-17 NOTE — Progress Notes (Addendum)
DAILY PROGRESS NOTE  Subjective:  No events overnight. He is now DNR. Continues to diurese. Recorded 1.7L net negative today.  H/H improved to 9/27.  Creatinine has drifted up slightly.  Objective:  Temp:  [97.6 F (36.4 C)-98 F (36.7 C)] 97.9 F (36.6 C) (06/18 1411) Pulse Rate:  [51-66] 62 (06/18 1514) Resp:  [19-20] 20 (06/18 1411) BP: (132-170)/(61-83) 156/61 mmHg (06/18 1411) SpO2:  [94 %-97 %] 94 % (06/18 1411) Weight change:   Intake/Output from previous day: 06/17 0701 - 06/18 0700 In: 1493.1 [P.O.:840; Blood:653.1] Out: 1400 [Urine:1400]  Intake/Output from this shift: Total I/O In: -  Out: 1775 [Urine:1775]  Medications: Current Facility-Administered Medications  Medication Dose Route Frequency Provider Last Rate Last Dose  . acetaminophen (TYLENOL) tablet 650 mg  650 mg Oral Q6H PRN Gwen Pounds, MD   650 mg at 04/17/13 0006   Or  . acetaminophen (TYLENOL) suppository 650 mg  650 mg Rectal Q6H PRN Gwen Pounds, MD      . Melene Muller ON 04/18/2013] darbepoetin (ARANESP) injection 80 mcg  80 mcg Subcutaneous Once Gwen Pounds, MD      . doxycycline (VIBRA-TABS) tablet 100 mg  100 mg Oral Q12H Gwen Pounds, MD   100 mg at 04/17/13 0950  . furosemide (LASIX) injection 40 mg  40 mg Intravenous Daily Gwen Pounds, MD   40 mg at 04/17/13 0950  . irbesartan (AVAPRO) tablet 150 mg  150 mg Oral Daily Gwen Pounds, MD   150 mg at 04/17/13 0950  . metoprolol (LOPRESSOR) tablet 50 mg  50 mg Oral BID Gwen Pounds, MD   50 mg at 04/17/13 0950  . ondansetron (ZOFRAN) tablet 4 mg  4 mg Oral Q6H PRN Gwen Pounds, MD       Or  . ondansetron [facility]) injection 4 mg  4 mg Intravenous Q6H PRN Gwen Pounds, MD      . pantoprazole (PROTONIX) EC tablet 40 mg  40 mg Oral BID AC Gwen Pounds, MD   40 mg at 04/17/13 1609  . simvastatin (ZOCOR) tablet 20 mg  20 mg Oral q1800 Gwen Pounds, MD   20 mg at 04/16/13 1458  . sodium chloride 0.9 % injection 3 mL  3 mL Intravenous Q12H Gwen Pounds, MD    3 mL at 04/16/13 1456    Physical Exam: General appearance: alert, no distress and coarse tremor, can barely cut his meat Neck: JVD - 2 cm above sternal notch, no adenopathy, no carotid bruit, supple, symmetrical, trachea midline and thyroid not enlarged, symmetric, no tenderness/mass/nodules Lungs: rhonchi bilaterally Heart: irregularly irregular rhythm Abdomen: soft, non-tender; bowel sounds normal; no masses,  no organomegaly and distended Extremities: edema 2+, sore on the right anterior shin, mild erythema Pulses: 2+ and symmetric Skin: mildy erythematous shins, chronic stasis edema Neurologic: Mental status: Alert, oriented, thought content appropriate, difficult hearing, coarse upper extremity intention tremor  Lab Results: Results for orders placed during the hospital encounter of 04/16/13 (from the past 48 hour(s))  OCCULT BLOOD, POC DEVICE     Status: Abnormal   Collection Time    04/16/13  8:16 AM      Result Value Range   Fecal Occult Bld POSITIVE (*) NEGATIVE  CBC WITH DIFFERENTIAL     Status: Abnormal   Collection Time    04/16/13  8:54 AM      Result Value Range   WBC 8.8  4.0 - 10.5  K/uL   RBC 2.97 (*) 4.22 - 5.81 MIL/uL   Hemoglobin 8.0 (*) 13.0 - 17.0 g/dL   HCT 16.1 (*) 09.6 - 04.5 %   MCV 85.2  78.0 - 100.0 fL   MCH 26.9  26.0 - 34.0 pg   MCHC 31.6  30.0 - 36.0 g/dL   RDW 40.9 (*) 81.1 - 91.4 %   Platelets 208  150 - 400 K/uL   Neutrophils Relative % 76  43 - 77 %   Neutro Abs 6.7  1.7 - 7.7 K/uL   Lymphocytes Relative 18  12 - 46 %   Lymphs Abs 1.6  0.7 - 4.0 K/uL   Monocytes Relative 5  3 - 12 %   Monocytes Absolute 0.4  0.1 - 1.0 K/uL   Eosinophils Relative 1  0 - 5 %   Eosinophils Absolute 0.0  0.0 - 0.7 K/uL   Basophils Relative 0  0 - 1 %   Basophils Absolute 0.0  0.0 - 0.1 K/uL  COMPREHENSIVE METABOLIC PANEL     Status: Abnormal   Collection Time    04/16/13  8:54 AM      Result Value Range   Sodium 140  135 - 145 mEq/L   Potassium 4.1   3.5 - 5.1 mEq/L   Chloride 105  96 - 112 mEq/L   CO2 27  19 - 32 mEq/L   Glucose, Bld 111 (*) 70 - 99 mg/dL   BUN 51 (*) 6 - 23 mg/dL   Creatinine, Ser 7.82 (*) 0.50 - 1.35 mg/dL   Calcium 9.0  8.4 - 95.6 mg/dL   Total Protein 8.2  6.0 - 8.3 g/dL   Albumin 2.8 (*) 3.5 - 5.2 g/dL   AST 20  0 - 37 U/L   ALT 13  0 - 53 U/L   Alkaline Phosphatase 140 (*) 39 - 117 U/L   Total Bilirubin 0.5  0.3 - 1.2 mg/dL   GFR calc non Af Amer 31 (*) >90 mL/min   GFR calc Af Amer 36 (*) >90 mL/min   Comment:            The eGFR has been calculated     using the CKD EPI equation.     This calculation has not been     validated in all clinical     situations.     eGFR's persistently     <90 mL/min signify     possible Chronic Kidney Disease.  PRO B NATRIURETIC PEPTIDE     Status: Abnormal   Collection Time    04/16/13  8:54 AM      Result Value Range   Pro B Natriuretic peptide (BNP) 19449.0 (*) 0 - 450 pg/mL  TROPONIN I     Status: None   Collection Time    04/16/13  8:54 AM      Result Value Range   Troponin I <0.30  <0.30 ng/mL   Comment:            Due to the release kinetics of cTnI,     a negative result within the first hours     of the onset of symptoms does not rule out     myocardial infarction with certainty.     If myocardial infarction is still suspected,     repeat the test at appropriate intervals.  PROTIME-INR     Status: None   Collection Time    04/16/13  8:54 AM  Result Value Range   Prothrombin Time 14.5  11.6 - 15.2 seconds   INR 1.15  0.00 - 1.49  TYPE AND SCREEN     Status: None   Collection Time    04/16/13  8:54 AM      Result Value Range   ABO/RH(D) O NEG     Antibody Screen NEG     Sample Expiration 04/19/2013     Unit Number [id number]     Blood Component Type RED CELLS,LR     Unit division 00     Status of Unit ISSUED,FINAL     Transfusion Status OK TO TRANSFUSE     Crossmatch Result Compatible     Unit Number [id number]     Blood  Component Type RED CELLS,LR     Unit division 00     Status of Unit ISSUED,FINAL     Transfusion Status OK TO TRANSFUSE     Crossmatch Result Compatible    ABO/RH     Status: None   Collection Time    04/16/13  9:54 AM      Result Value Range   ABO/RH(D) O NEG    PREPARE RBC (CROSSMATCH)     Status: None   Collection Time    04/16/13 12:00 PM      Result Value Range   Order Confirmation ORDER PROCESSED BY BLOOD BANK    BASIC METABOLIC PANEL     Status: Abnormal   Collection Time    04/17/13  6:07 AM      Result Value Range   Sodium 142  135 - 145 mEq/L   Potassium 3.9  3.5 - 5.1 mEq/L   Chloride 105  96 - 112 mEq/L   CO2 29  19 - 32 mEq/L   Glucose, Bld 93  70 - 99 mg/dL   BUN 43 (*) 6 - 23 mg/dL   Creatinine, Ser 3.08 (*) 0.50 - 1.35 mg/dL   Calcium 8.9  8.4 - 65.7 mg/dL   GFR calc non Af Amer 29 (*) >90 mL/min   GFR calc Af Amer 34 (*) >90 mL/min   Comment:            The eGFR has been calculated     using the CKD EPI equation.     This calculation has not been     validated in all clinical     situations.     eGFR's persistently     <90 mL/min signify     possible Chronic Kidney Disease.  CBC     Status: Abnormal   Collection Time    04/17/13  6:07 AM      Result Value Range   WBC 7.3  4.0 - 10.5 K/uL   RBC 3.30 (*) 4.22 - 5.81 MIL/uL   Hemoglobin 9.0 (*) 13.0 - 17.0 g/dL   HCT 84.6 (*) 96.2 - 95.2 %   MCV 84.5  78.0 - 100.0 fL   MCH 27.3  26.0 - 34.0 pg   MCHC 32.3  30.0 - 36.0 g/dL   RDW 84.1 (*) 32.4 - 40.1 %   Platelets 169  150 - 400 K/uL    Imaging: Dg Chest Port 1 View  04/16/2013   *RADIOLOGY REPORT*  Clinical Data: Cough.  PORTABLE CHEST - 1 VIEW  Comparison: None.  Findings: The heart is enlarged.  There are surgical changes from bypass surgery.  There is tortuosity and calcification of the thoracic aorta.  There are increased interstitial markings  and peribronchial thickening which could reflect chronic bronchitic changes related to smoking. Mild  elevation of the left hemidiaphragm with overlying vascular crowding and atelectasis.  No edema or definite effusions.  The bony thorax is intact.  IMPRESSION:  1.  Cardiac enlargement. 2.  Probable chronic bronchitic type lung changes.  No definite infiltrates or effusions. 3.  Left basilar atelectasis.   Original Report Authenticated By: Rudie Meyer, M.D.    Assessment:  1. Principal Problem: 2.   Lower GI bleed 3. Active Problems: 4.   Atrial fibrillation 5.   Volume overload 6.   Acute combined systolic and diastolic congestive heart failure 7.   CKD (chronic kidney disease) stage 3, GFR 30-59 ml/min 8.   Anemia 9.   CAD (coronary artery disease) of artery bypass graft 10.   HTN (hypertension) 11.   Plan:  1. Appears to be diuresing well with lasix. Check BNP tomorrow in the am. Can likely switch to po lasix 40 mg BID tomorrow.  No further bleeding reported. He appears to have a good appetite and has less swelling in his legs.  Anticoagulation alternatives to warfarin include aspirin or low dose Eliquis - consider Eliquis 2.5 mg BID, which has similar adverse bleeding rates to 162 mg aspirin in the AVERROES trial, however, stroke risk reduction is around 90% compared to 20% with aspirin. His CHADS2 score is 3.  Time Spent Directly with Patient:  15 minutes  Length of Stay:  LOS: 1 day   Chrystie Nose, MD, [facility] Attending Cardiologist [facility]  HILTY,Kenneth C 04/17/2013, 5:16 PM

## 2013-04-18 ENCOUNTER — Encounter (HOSPITAL_COMMUNITY): Payer: PRIVATE HEALTH INSURANCE

## 2013-04-18 LAB — BASIC METABOLIC PANEL
BUN: 47 mg/dL — ABNORMAL HIGH (ref 6–23)
GFR calc non Af Amer: 25 mL/min — ABNORMAL LOW (ref >90)
Glucose, Bld: 99 mg/dL (ref 70–99)
Potassium: 3.9 mEq/L (ref 3.5–5.1)

## 2013-04-18 LAB — PRO B NATRIURETIC PEPTIDE: Pro B Natriuretic peptide (BNP): 19016 pg/mL — ABNORMAL HIGH (ref 0–450)

## 2013-04-18 LAB — CBC
HCT: 29.5 % — ABNORMAL LOW (ref 39.0–52.0)
Hemoglobin: 9.1 g/dL — ABNORMAL LOW (ref 13.0–17.0)
MCH: 26.2 pg (ref 26.0–34.0)
MCHC: 30.8 g/dL (ref 30.0–36.0)

## 2013-04-18 MED ORDER — ASPIRIN 81 MG PO TABS: ORAL_TABLET | ORAL | Status: DC

## 2013-04-18 MED ORDER — ACETAMINOPHEN 325 MG PO TABS: 650.0000 mg | ORAL_TABLET | Freq: Four times a day (QID) | ORAL | Status: DC | PRN

## 2013-04-18 MED ORDER — DOXYCYCLINE HYCLATE 100 MG PO TABS: ORAL_TABLET | ORAL | Status: DC

## 2013-04-18 MED ORDER — FUROSEMIDE 40 MG PO TABS
40.0000 mg | ORAL_TABLET | Freq: Every day | ORAL | Status: DC
  Administered 2013-04-18: 40 mg via ORAL
  Filled 2013-04-18: qty 1

## 2013-04-18 NOTE — Progress Notes (Signed)
Patient discharged to [facility] in stable condition, no change from morning assessment.   Transported via [employer]

## 2013-04-18 NOTE — Progress Notes (Addendum)
Physical Therapy Treatment Patient Details Name: [name] [name] MRN: [id number] DOB: July 19, 1921 Today's Date: 04/18/2013 Time: 4098-1191 PT Time Calculation (min): 24 min  PT Assessment / Plan / Recommendation Comments on Treatment Session  Follow-up session to assess mobility prior to d/c home today. Pt unable to ambulate or stand for longer than 10-15 seconds due to severe pain (pt rates 10/10) in bil plantar surfaces of feet. Explained to pt that he cannot d/c home in this condition and that therapist does not feel he will be able to safely function at home alone. Revisited need for ST rehab however pt continues to refuse.  Also, pt had areas on backs of both legs that began bleeding after he stood up-RN notified and dressed areas.     Follow Up Recommendations  SNF;Supervision/Assistance - 24 hour  (HHPT and 24 hour supervision/assist if pt continues to refuse SNF). Pt cannot d/c home alone-must have 24 hour assist.      Does the patient have the potential to tolerate intense rehabilitation     Barriers to Discharge        Equipment Recommendations  Wheelchair (measurements PT);Wheelchair cushion (measurements PT) (possibly if pt continues to have difficulty ambulating)    Recommendations for Other Services OT consult  Frequency Min 3X/week   Plan Discharge plan remains appropriate    Precautions / Restrictions Precautions Precautions: Fall Restrictions Weight Bearing Restrictions: No   Pertinent Vitals/Pain 10/10 bil feet with attempts to stand/walk    Mobility  Bed Mobility Bed Mobility: Supine to Sit Supine to Sit: 4: Min guard;HOB elevated;With rails Details for Bed Mobility Assistance: Increased time. VCs safety, technique. Requred use of handrail to aid with getting trunk to upright.  Transfers Transfers: Sit to Stand;Stand to Sit Sit to Stand: 3: Mod assist;From bed;From chair/3-in-1;With upper extremity assist Stand to Sit: 4: Min assist;To bed;To chair/3-in-1;With  armrests Details for Transfer Assistance: x 2. Increased assistance to rise compared to yesterday. VCs safety, technique, hand placement. Assist to rise, stabilize, control descent. Pt unsteady/shaky. Unable to tolerate beyond 10-15 seconds. Ambulation/Gait Ambulation/Gait Assistance: 3: Mod assist Ambulation Distance (Feet): 3 Feet Assistive device: Rolling walker Ambulation/Gait Assistance Details: Attemtped ambulation in room x2. Pt only able to take 3 steps-barely made it to recliner. Pt reports 10/10 pain bilateral plantar surfaces of feet limiting mobility.  Gait Pattern: Step-to pattern;Antalgic;Shuffle    Exercises     PT Diagnosis:    PT Problem List:   PT Treatment Interventions:     PT Goals Acute Rehab PT Goals Pt will go Supine/Side to Sit: with modified independence PT Goal: Supine/Side to Sit - Progress: Progressing toward goal Pt will go Sit to Stand: with modified independence PT Goal: Sit to Stand - Progress: Progressing toward goal Pt will go Stand to Sit: with modified independence PT Goal: Stand to Sit - Progress: Progressing toward goal Pt will Ambulate: 51 - 150 feet;with modified independence;with rolling walker PT Goal: Ambulate - Progress: Progressing toward goal  Visit Information  Last PT Received On: 04/18/13 Assistance Needed: +1    Subjective Data  Subjective: I cant walk honey...my feet hurt Patient Stated Goal: Home   Cognition  Cognition Arousal/Alertness: Awake/alert Behavior During Therapy: WFL for tasks assessed/performed Overall Cognitive Status: Within Functional Limits for tasks assessed    Balance     End of Session PT - End of Session Equipment Utilized During Treatment: Gait belt Activity Tolerance: Patient limited by pain Patient left: in chair;with call bell/phone within reach  GP     Weston Anna, MPT Pager: [phone]

## 2013-04-18 NOTE — Progress Notes (Signed)
Report called to [facility] 

## 2013-04-18 NOTE — Progress Notes (Signed)
Clinical Social Work  Patient accepted to first choice at [facility]. CSW faxed DC summary and SNF agreeable to admission after 3pm. CSW prepared DC packet with FL2 and hard scripts included. MD did not leave DNR form. CSW spoke with Chiropodist who informed CSW to notify patient and PTAR that patient will be transported without gold DNR form. CSW informed patient, dtr, and RN of DC plans. CSW coordinated transportation via [facility]. (Request # [phone]). CSW is signing off.  [place], [place] [phone]

## 2013-04-18 NOTE — Discharge Summary (Signed)
Physician Discharge Summary  DISCHARGE SUMMARY   Patient ID: [name] [name] MR#: [id number] DOB/AGE: 11/21/1920 79 y.o.   Attending Physician:Meghan Tiemann M  Patient's FAO:ZHYQM,VHQI M, MD  Consults:Treatment Team:  Shirley Friar, MD Runell Gess, MD**  Admit date: 04/16/2013 Discharge date: 04/18/2013  Discharge Diagnoses:  Principal Problem:   Lower GI bleed Active Problems:   Atrial fibrillation   Volume overload   Acute combined systolic and diastolic congestive heart failure   CKD (chronic kidney disease) stage 3, GFR 30-59 ml/min   Anemia   CAD (coronary artery disease) of artery bypass graft   HTN (hypertension)   Patient Active Problem List   Diagnosis Date Noted  . Lower GI bleed 04/16/2013  . Volume overload 04/16/2013  . Acute combined systolic and diastolic congestive heart failure 04/16/2013  . CKD (chronic kidney disease) stage 3, GFR 30-59 ml/min 04/16/2013  . Anemia 04/16/2013  . CAD (coronary artery disease) of artery bypass graft 04/16/2013  . HTN (hypertension) 04/16/2013  . Atrial fibrillation 01/16/2013  . Long term (current) use of anticoagulants 01/16/2013   Past Medical History  Diagnosis Date  . Hypertension   . History of prostate cancer 1996    treated with radiation  . Arthritis   . Gout, arthritis 2010    bil feet  . Cancer     PORSTATE-HX OF RADIATION AND SURGERY  . Diverticulosis of colon     Discharged Condition: stable   Discharge Medications:   Medication List    STOP taking these medications       valsartan-hydrochlorothiazide 80-12.5 MG per tablet  Commonly known as:  DIOVAN-HCT      TAKE these medications       acetaminophen 325 MG tablet  Commonly known as:  TYLENOL  Take 2 tablets (650 mg total) by mouth every 6 (six) hours as needed.     aspirin 81 MG tablet  Will discuss ASA vrs Eloquis.  Do not resume ASA: Take 1 tablet (81 mg total) by mouth daily until after We discuss as an outpatient..      calcium carbonate 500 MG chewable tablet  Commonly known as:  TUMS - dosed in mg elemental calcium  Chew 1 tablet by mouth as needed.     doxycycline 100 MG tablet  Commonly known as:  VIBRA-TABS  Take 1 tablet (100 mg total) by mouth every 12 (twelve) hours - [facility] Sunday 6/22.     furosemide 40 MG tablet  Commonly known as:  LASIX  Take 40 mg by mouth daily.     lovastatin 40 MG tablet  Commonly known as:  MEVACOR  Take 40 mg by mouth at bedtime.     metoprolol 50 MG tablet  Commonly known as:  LOPRESSOR  Take 50 mg by mouth 2 (two) times daily.     valsartan 80 MG tablet  Commonly known as:  DIOVAN  Take 80 mg by mouth daily.        Hospital Procedures: Dg Chest Port 1 View  04/16/2013   *RADIOLOGY REPORT*  Clinical Data: Cough.  PORTABLE CHEST - 1 VIEW  Comparison: None.  Findings: The heart is enlarged.  There are surgical changes from bypass surgery.  There is tortuosity and calcification of the thoracic aorta.  There are increased interstitial markings and peribronchial thickening which could reflect chronic bronchitic changes related to smoking. Mild elevation of the left hemidiaphragm with overlying vascular crowding and atelectasis.  No edema or definite effusions.  The  bony thorax is intact.  IMPRESSION:  1.  Cardiac enlargement. 2.  Probable chronic bronchitic type lung changes.  No definite infiltrates or effusions. 3.  Left basilar atelectasis.   Original Report Authenticated By: Rudie Meyer, M.D.    History of Present Illness: 79 Male with MMP who has been seen in our office twice in week prior to admission. We had been dealing with LE Edema, Weeping Edema, Vol Overload and DOE. His Cr was 2.1 and Hbg in 8's last week. We diuresed 9#s off him and his Cr improved as well as better breath and better thriving. He had a Hbg of 7.8 1 day PTA and I offered him admission. He elected to get Transfusion as outpt and follow up quickly. This was unable to take place as he  started having BRBPR, dark brown clots in diaper and brought to ED 6/17 via EMS for eval and Rx. No Ab Pain. Examination in ED revealed the presence of clots and gross blood. Patient's vital signs stable other than elevated blood pressure. He has been off Coumadin of late. Hbg at 8 is better than 1 day PTA. I was called to admit. Cr was stable.  ED started 2 Units PRBCs and got him a Tele bed.  There is erythema associated with the swelling of LEs but no cellulitis appreciated and WBC 8.8.  Appears Venous stasis with Dermatitis. He has been slightly short of breath. Chest x-ray did not show any overt pulmonary edema, but BNP is 19,449. Patient likely having some element of congestive heart failure which will complicate blood transfusion making IV Lasix necessary.  Of Note Dr Allyson Sabal is his Cardiologist and last ECHO 2008 showed EF 45% c Mod Pulm HTN. Last Myoview was 2010 and Non-Ischemic. Known AFib. Last saw Dr Allyson Sabal 04/2012 and last coumadin check was 12/2012 prior to D/cin it..  Also L Sided UC treated per Dr Randa Evens.  In ED Rxed c Lasix 80 IV x 1, Tylenol, Benedryl, MSO4, Zofran, and IV Protonix. i admitted him to tele and called [facility] for consults.   Hospital Course:  50 male c CAD/CABG, CHF, CKD, and UC admitted c Rectal Bleeding, Sxatic anemia and acute on chronic CHF/Vol Overload. S/P 2U PRBCs and Hbg up 1 gram and stable over the last 24 + hrs. Last 2 Hbgs were 9.0 and 9.1.  Besides blood i also gave him Fereheme and Aranesp.  We probably are going to have to restart Aranesp at day hospital q 2 weeks.  Bleeding has stopped.  Feet pains controlled c Tylenol. Markedly less edema. Lesions/Abraisions/and slogh skin are markedly better. Breathing is stable.  DNR.  Hungry and doing well with POs. No new C/o on 04/18/13 and ready for D/c.  BRBPR/LGIB and Sxatic Anemia in pt who has known UC. Bleeding stopped. Last Colon was 05/2006. S/P 2 units PRBCs and Hbg up 1 gm compared  to admission. Appreciate Dr Louis Matte help and he believes the bleed is Diverticular. Dr Bosie Clos did not think any testing needed and signed off after the consult.  Hold on any immunosuppressives. If bleeding returns with abdominal pain or diarrhea or bleeding persists then may need to add Rowasa enemas or PO mesalamine. No Lovenox/Coumadin/ASA for now. Supportive Care. Protonix given in hospital and not needed as outpatient. Will follow up with pt on Monday/Tuesday of next week.  Vol Overload due to CHF Systolic/Diastolic. Severe Valvular Dz c Severe Pulm HTN. Venous insufficiency and Stasis Dermatitis also  noted. No Pulm Edema on CXR. Continue to Diurese and follow Cr. Appreciate Dr Berry's/ Dr Blanchie Dessert input. ECHO 2008 showed EF 45% c Mod Pulm HTN. Last Myoview was 2010 and Non-Ischemic. Known AFib. Current ECHO shows - EF 55% to 60%. Diastolic Dysfunction and Severe Pulmonary HTN, moderate Aortic stenosis. Mod MR, LA/RA Severely Dillitated, TR.  Legs are the least edematous in a long time.  No weeping, less edema  Continue doxy for 3 more days and continue lasix.  One spot on leg is covered well.  Creatinine bumped a little and I backed off on the IV lasix and will go to PO only once per day.  F/Up Mon-Tuesday and repeat Cr. Wound care made recs: cover open areas with a soft silicone foam dressing, elevate LEs both in and out of bed. Recommend (as erythema and edema resolve) that we consider 2 or 4-layer compression wraps as part of the POC. HHRN can apply   Last Cards note yesterday: Appears to be diuresing well with lasix. Check BNP tomorrow in the am. Can likely switch to po lasix 40 mg BID tomorrow. No further bleeding reported. He appears to have a good appetite and has less swelling in his legs. Anticoagulation alternatives to warfarin include aspirin or low dose Eliquis - consider Eliquis 2.5 mg BID, which has similar adverse bleeding rates to 162 mg aspirin in the AVERROES trial, however, stroke  risk reduction is around 90% compared to 20% with aspirin. His CHADS2 score is 3.  Again b/c edema is markedly better and his Cr bumped, we will only do once per day Lasix.  Pro-BNP still 19k Will address Anticoagulation as outpt.  CHRONIC KIDNEY DISEASE STAGE III - Creat 1.8 - 2.1. Stable but bumped. Follow Cr. Adjust Lasix dosing as Edema and cr allow.  ANEMIA due to chronic Dz, Iron Deficiency and acute blood loss from the GIB. S/P Transfusion,  Aranesp 80 and Fereheme - follow Hbg.   ATRIAL FIBRILLATION - per cards. EKG shows Afib  Off Coumadin. Hold ASA c GIB Cards wants to consider ASA vrs Eloquis as outpt.   CORONARY ARTERY DISEASE/s/p CABG - Dr Allyson Sabal. No Angina.  HYPERTENSION - HD stable  DVT proph - Squeezers were not tolerated.  Deconditioning - PT/OT/CW . Home today with HH. Per OT note: Home health OT;Supervision/Assistance - 24 hour (if pt refuses snf) Discussed SNF with pt: He does not want to go anywhere but home. Daughters check on him daily but would recommend initial 24/7 if pt chooses home.  Per PT note: Home health PT;SNF;Supervision/Assistance - 24 hour (Dependent on acute stay progress & level of home assist avai)   Continue DNR as outpt.  Day of Discharge Exam BP 121/51  Pulse 59  Temp(Src) 98.3 F (36.8 C) (Oral)  Resp 18  Ht 5\' 6"  (1.676 m)  SpO2 95%  Physical Exam: General appearance: alert, no distress and coarse tremor Neck: Min JVD Lungs: Min rhonchi bilaterally ow clear Heart: irregularly irregular rhythm, soft m, Sternotomy Abdomen: soft, non-tender; bowel sounds normal; no masses, no organomegaly and distended  Extremities: edema now tr with resolving venous stasis, sore on the right anterior shin, mild erythema  Pulses: 2+ and symmetric  Neurologic: Mental status: Alert, oriented, thought content appropriate, difficult hearing, coarse upper extremity intention tremor    Discharge Labs:  Recent Labs  04/17/13 0607 04/18/13 0555  NA 142  143  K 3.9 3.9  CL 105 104  CO2 29 32  GLUCOSE 93 99  BUN 43* 47*  CREATININE 1.91* 2.17*  CALCIUM 8.9 8.9    Recent Labs  04/16/13 0854  AST 20  ALT 13  ALKPHOS 140*  BILITOT 0.5  PROT 8.2  ALBUMIN 2.8*    Recent Labs  04/16/13 0854 04/17/13 0607 04/18/13 0555  WBC 8.8 7.3 9.6  NEUTROABS 6.7  --   --   HGB 8.0* 9.0* 9.1*  HCT 25.3* 27.9* 29.5*  MCV 85.2 84.5 85.0  PLT 208 169 172    Recent Labs  04/16/13 0854  TROPONINI <0.30   No results found for this basename: TSH, T4TOTAL, FREET3, T3FREE, THYROIDAB,  in the last 72 hours No results found for this basename: VITAMINB12, FOLATE, FERRITIN, TIBC, IRON, RETICCTPCT,  in the last 72 hours Lab Results  Component Value Date   INR 1.15 04/16/2013       Discharge instructions:  Final discharge disposition not confirmed Follow-up Information   Follow up with Gwen Pounds, MD In 1 week.   Contact information:   [address] [facility] [place] [address] [phone]       Follow up with Runell Gess, MD In 2 weeks.   Contact information:   [address] [place] [address] [phone]        Disposition: home with [facility] and family help  Follow-up Appts: Follow-up with Dr. Timothy Lasso at [facility] in 7 days.  Call for appointment.  Condition on Discharge: stable  Tests Needing Follow-up: Labs  Time spent in discharge (includes decision making & examination of pt): 40 min  Signed: Randall Colden M 04/18/2013, 8:12 AM

## 2013-04-18 NOTE — Plan of Care (Signed)
Problem: Phase I Progression Outcomes Goal: OOB as tolerated unless otherwise ordered Outcome: Not Progressing Pt still having significant difficulty mobilizing. Discussed ST rehab with pt again-he is now agreeable to SNF. Left VM message for CSW [facility]). Rebeca Alert, PT [phone]

## 2013-04-18 NOTE — Progress Notes (Signed)
Occupational Therapy Treatment Patient Details Name: [name] [name] MRN: [id number] DOB: 09/29/1921 Today's Date: 04/18/2013 Time: 6578-4696 OT Time Calculation (min): 24 min  OT Assessment / Plan / Recommendation Comments on Treatment Session  Pt unable to walk today: increased pain/shakiness. Will need SNF    Follow Up Recommendations  SNF    Barriers to Discharge       Equipment Recommendations  3 in 1 bedside comode    Recommendations for Other Services    Frequency Min 2X/week   Plan Discharge plan needs to be updated    Precautions / Restrictions Precautions Precautions: Fall Restrictions Weight Bearing Restrictions: No   Pertinent Vitals/Pain Pain in bil especially with weight bearing 8/10    ADL  Lower Body Bathing: Moderate assistance Where Assessed - Lower Body Bathing: Supported sit to stand Equipment Used: Rolling walker Transfers/Ambulation Related to ADLs: pt unable to walk today.  Has increased pain and is much more shaky. ADL Comments: Educated on reacher but pt did not use today.  Pt's skin is weeping and one area uncovered and compromised on R arch:  called for RN to check out.  Daughter present and cannot stay 24/7.  Talked to both pt and daughter about need for snf for rehab    OT Diagnosis:    OT Problem List:   OT Treatment Interventions:     OT Goals ADL Goals Pt Will Perform Lower Body Bathing: with supervision;Sit to stand from chair;with adaptive equipment ADL Goal: Lower Body Bathing - Progress: Progressing toward goals  Visit Information  Last OT Received On: 04/18/13 Assistance Needed: +1    Subjective Data      Prior Functioning       Cognition  Cognition Arousal/Alertness: Awake/alert Behavior During Therapy: WFL for tasks assessed/performed Overall Cognitive Status: Within Functional Limits for tasks assessed (still some decreased awareness of liimitations: feels he can't walk because he hasn't eaten breakfast.  Explained it is  more than this. )    Mobility   Transfers Sit to Stand: 3: Mod assist;From chair/3-in-1 Stand to Sit: 4: Min assist;To bed;To chair/3-in-1;With armrests Details for Transfer Assistance: stood twice.  Assist to rise and steady.      Exercises      Balance     End of Session OT - End of Session Activity Tolerance: Patient limited by pain Patient left: in chair;with call bell/phone within reach;with family/visitor present  GO     Lashawndra Lampkins 04/18/2013, 12:04 PM Marica Otter, OTR/L [phone] 04/18/2013

## 2013-04-18 NOTE — Progress Notes (Addendum)
Clinical Social Work Department CLINICAL SOCIAL WORK PLACEMENT NOTE 04/18/2013  Patient:  [name] [name], [name] [name]  Account Number:  [id number] Admit date:  04/16/2013  Clinical Social Worker:  Unk Lightning, LCSW  Date/time:  04/18/2013 10:40 AM  Clinical Social Work is seeking post-discharge placement for this patient at the following level of care:   SKILLED NURSING   (*CSW will update this form in Epic as items are completed)   04/18/2013  Patient/family provided with [facility] Department of Clinical Social Work's list of facilities offering this level of care within the geographic area requested by the patient (or if unable, by the patient's family).  04/18/2013  Patient/family informed of their freedom to choose among providers that offer the needed level of care, that participate in Medicare, Medicaid or managed care program needed by the patient, have an available bed and are willing to accept the patient.  04/18/2013  Patient/family informed of [facility]' ownership interest in [facility], as well as of the fact that they are under no obligation to receive care at this facility.  PASARR submitted to EDS on 04/18/2013 PASARR number received from EDS on 04/18/2013  FL2 transmitted to all facilities in geographic area requested by pt/family on  04/18/2013 FL2 transmitted to all facilities within larger geographic area on   Patient informed that his/her managed care company has contracts with or will negotiate with  certain facilities, including the following:     Patient/family informed of bed offers received:  04/18/13 Patient chooses bed at [facility] Physician recommends and patient [facility] bed [facility]    [facility]13 Patient to be transferred to facility by [facility]  The following physician request were entered in Epic:   Additional Comments:

## 2013-04-18 NOTE — Progress Notes (Signed)
Clinical Social Work  CSW received call from PT that patient is now interested in SNF placement. CSW met with patient and dtr at bedside. Dtr reports she came to pick up patient but he cannot ambulate on his own and cannot return home. CSW explained that MD has DC patient but CSW could assist with placement. CSW provided SNF list and explained process along with possible copays with insurance for SNF. Patient interested in [facility] or [facility]. CSW encouraged patient and family to review list and have alternative options in case these places are not able to offer a bed.   CSW completed FL2 and faxed out. CSW will continue to follow.  [place], [place] [phone]

## 2013-04-19 ENCOUNTER — Non-Acute Institutional Stay (SKILLED_NURSING_FACILITY): Payer: PRIVATE HEALTH INSURANCE | Admitting: Internal Medicine

## 2013-04-19 ENCOUNTER — Encounter: Payer: Self-pay | Admitting: Internal Medicine

## 2013-04-19 DIAGNOSIS — M79671 Pain in right foot: Secondary | ICD-10-CM | POA: Insufficient documentation

## 2013-04-19 DIAGNOSIS — M79672 Pain in left foot: Secondary | ICD-10-CM

## 2013-04-19 DIAGNOSIS — R531 Weakness: Secondary | ICD-10-CM

## 2013-04-19 DIAGNOSIS — K922 Gastrointestinal hemorrhage, unspecified: Secondary | ICD-10-CM

## 2013-04-19 DIAGNOSIS — I4891 Unspecified atrial fibrillation: Secondary | ICD-10-CM

## 2013-04-19 DIAGNOSIS — R059 Cough, unspecified: Secondary | ICD-10-CM | POA: Insufficient documentation

## 2013-04-19 DIAGNOSIS — R5381 Other malaise: Secondary | ICD-10-CM

## 2013-04-19 DIAGNOSIS — I509 Heart failure, unspecified: Secondary | ICD-10-CM

## 2013-04-19 DIAGNOSIS — R05 Cough: Secondary | ICD-10-CM

## 2013-04-19 DIAGNOSIS — I10 Essential (primary) hypertension: Secondary | ICD-10-CM

## 2013-04-19 DIAGNOSIS — I5041 Acute combined systolic (congestive) and diastolic (congestive) heart failure: Secondary | ICD-10-CM

## 2013-04-19 DIAGNOSIS — M79609 Pain in unspecified limb: Secondary | ICD-10-CM

## 2013-04-19 NOTE — Progress Notes (Signed)
Patient ID: [name] [name], male   DOB: April 20, 1921, 79 y.o.   MRN: [id number]    PCP: Gwen Pounds, MD  Code Status: DNR  Allergies  Allergen Reactions  . Ancef (Cefazolin) Anaphylaxis  . Penicillins Cross Reactors     Anaphylaxix/don't give anything in the "family" of penicillins  . Sulfa Antibiotics     Don't remember. Didn't want to give me anymore    Chief Complaint: new admit post hospitalization 04/16/13- 04/18/13  HPI:  79 y/o male patient is here for STR post hospital admission with lower gi bleed. He has history of CAD s/p CABG, chf, CKD and ulcerative colitis. He received 2 u prbc transfusion, fereheme and aransep. His bleeding stopped. Gi was consulted and there were concerns for diverticular bleed. Antiplatelet agents are held for now. PPI was given in hospital but discontinued on discharge. Wound care team addressed his chronic venous stasis skin changes. He was started on doxycycline for presumed cellulitis. There were concerns for volume overload with his hx of  CHF and cardiology was consulted. He underwent diuresis. Give his weakness, he was sent to SNF for STR.  He was seen in his room today with therapy team present. He complaints of weakness and pain in his feet. He has difficulty standing on his feet due to intermittent discomfort in his feet. Denies any history of DM. Appetite is fair  Review of Systems  Constitutional: Negative for fever, chills and diaphoresis.  HENT: Negative for congestion.   Eyes: Negative for blurred vision.  Respiratory: Positive for cough, shortness of breath and wheezing.   Cardiovascular: Negative for chest pain and palpitations.  Gastrointestinal: Negative for heartburn, nausea, vomiting, abdominal pain and constipation.  Genitourinary: Negative for dysuria.       Has a foley catheter  Musculoskeletal: Negative for falls.  Skin: Negative for rash.  Neurological: Positive for weakness. Negative for dizziness, loss of consciousness and  headaches.  Psychiatric/Behavioral: Negative for depression.    Past Medical History  Diagnosis Date  . Hypertension   . History of prostate cancer 1996    treated with radiation  . Arthritis   . Gout, arthritis 2010    bil feet  . Cancer     PORSTATE-HX OF RADIATION AND SURGERY  . Diverticulosis of colon    Past Surgical History  Procedure Laterality Date  . Coronary artery bypass graft  04/1995  . Appendectomy  1950s  . Eye surgery  2009    cataract surgery  . Cryoablation of prostate  06/23/2005  . Colonoscopy  11/03/2000   Social History:   reports that he quit smoking about 28 years ago. He has never used smokeless tobacco. He reports that he does not drink alcohol or use illicit drugs.  History reviewed. No pertinent family history.  Medications: Patient's Medications  New Prescriptions   No medications on file  Previous Medications   ACETAMINOPHEN (TYLENOL) 325 MG TABLET    Take 2 tablets (650 mg total) by mouth every 6 (six) hours as needed.   ASPIRIN 81 MG TABLET    Will discuss ASA vrs Eloquis.  Do not resume ASA: Take 1 tablet (81 mg total) by mouth daily until after We discuss as an outpatient.Marland Kitchen   CALCIUM CARBONATE (TUMS - DOSED IN MG ELEMENTAL CALCIUM) 500 MG CHEWABLE TABLET    Chew 1 tablet by mouth as needed.   DOXYCYCLINE (VIBRA-TABS) 100 MG TABLET    Take 1 tablet (100 mg total) by mouth every 12 (twelve)  hours - Finish Sunday 6/22.   FUROSEMIDE (LASIX) 40 MG TABLET    Take 40 mg by mouth daily.   LOVASTATIN (MEVACOR) 40 MG TABLET    Take 40 mg by mouth at bedtime.     METOPROLOL (LOPRESSOR) 50 MG TABLET    Take 50 mg by mouth 2 (two) times daily.     VALSARTAN (DIOVAN) 80 MG TABLET    Take 80 mg by mouth daily.  Modified Medications   No medications on file  Discontinued Medications   No medications on file     Physical Exam: Filed Vitals:   04/19/13 1336  BP: 127/83  Pulse: 60  Temp: 96.8 F (36 C)  Resp: 16  Weight: 171 lb (77.565 kg)  SpO2:  96%   General appearance: alert, no distress HEENT: no pallor, no icterus, no lymphadenopathy, no JVD, mmm Lungs: CTAB, bibasilar rhonchi, no wheeze or crackles Heart: irregularly irregular rhythm, soft murmur, Sternotomy Abdomen: soft, non-tender; bowel sounds normal; no masses, no organomegaly, foley catheter present Extremities: edema bilaterally with resolving venous stasis, sore on the right anterior shin, mild erythema , both legs wrapped in kerlix, distal pulses 2+ and symmetric, generalized weakness Neurologic:  Alert, oriented, thought content appropriate, coarse upper extremity intention tremor    Labs reviewed: Basic Metabolic Panel:  Recent Labs  16/10/96 0854 04/17/13 0607 04/18/13 0555  NA 140 142 143  K 4.1 3.9 3.9  CL 105 105 104  CO2 27 29 32  GLUCOSE 111* 93 99  BUN 51* 43* 47*  CREATININE 1.82* 1.91* 2.17*  CALCIUM 9.0 8.9 8.9   Liver Function Tests:  Recent Labs  04/16/13 0854  AST 20  ALT 13  ALKPHOS 140*  BILITOT 0.5  PROT 8.2  ALBUMIN 2.8*   CBC:  Recent Labs  04/16/13 0854 04/17/13 0607 04/18/13 0555  WBC 8.8 7.3 9.6  NEUTROABS 6.7  --   --   HGB 8.0* 9.0* 9.1*  HCT 25.3* 27.9* 29.5*  MCV 85.2 84.5 85.0  PLT 208 169 172   Cardiac Enzymes:  Recent Labs  04/16/13 0854  TROPONINI <0.30   04/19/13 wbc 10.1, hb 9.3, hct 30.8, plt 202, na 142, k 3.6, bun 49, cr 2.1  Radiological Exams: Dg Chest Port 1 View  04/16/2013   *RADIOLOGY REPORT*  Clinical Data: Cough.  PORTABLE CHEST - 1 VIEW  Comparison: None.  Findings: The heart is enlarged.  There are surgical changes from bypass surgery.  There is tortuosity and calcification of the thoracic aorta.  There are increased interstitial markings and peribronchial thickening which could reflect chronic bronchitic changes related to smoking. Mild elevation of the left hemidiaphragm with overlying vascular crowding and atelectasis.  No edema or definite effusions.  The bony thorax is intact.   IMPRESSION:  1.  Cardiac enlargement. 2.  Probable chronic bronchitic type lung changes.  No definite infiltrates or effusions. 3.  Left basilar atelectasis.   Original Report Authenticated By: Rudie Meyer, M.D.    Assessment/Plan  Lower gi bleed- most likely diverticular bleed. No further bleeding episodes. Cbc reviewed and h/h stable. Monitor cbc  Generalized weakness- in setting of recent bleed, fluid overload and medical comorbidities. Will have him work with therpay team. Fall precautions.  Foot pain- new, severe, pulses are intact, no calf muscle tendernss. Will check a1c to rule out diabetes. Concerns for neuropathic pain. Will have him on gabapentin 100 mg tid for now and reassess. To work with therapy team  Cough- has  productive cough and rhonchi on exam. Has been a smoker in past. Concerns for emphysematous/ chronic bronchitis changes contributing to his dyspnea and cough. Will start duonebs every 6 hours for 5 days and then q6h prn to help with bronchodilation. Reassess. Also to use incentive spirometer to help prevent further atelectasis  Venous stasis ulcer- continue wound care dressing. Monitor weight and try to main fluid load. Complete course of doxycyline and continue lasix  afib- rate currently under control. Continue metoprolol and asa  chf- continue b blocker, ARB and ASA with lasix for now. Check bmp. Monitor daily weight  HTN- bp stable, continue losartan and lopressor. Monitor bp  CKD- improved creatinine level from hospital, monitor closely as pt is on lasix  Family/ staff Communication: reviewed care plan with patient and nursing supervisor  Goals of care: return home after completion of STR  Labs/tests ordered: cbc, cmp

## 2013-04-30 ENCOUNTER — Encounter: Payer: Self-pay | Admitting: *Deleted

## 2013-05-01 ENCOUNTER — Encounter: Payer: Self-pay | Admitting: Cardiovascular Disease

## 2013-05-02 ENCOUNTER — Ambulatory Visit: Payer: PRIVATE HEALTH INSURANCE | Admitting: Cardiovascular Disease

## 2013-05-16 ENCOUNTER — Encounter (HOSPITAL_COMMUNITY): Payer: Self-pay | Admitting: Emergency Medicine

## 2013-05-16 ENCOUNTER — Other Ambulatory Visit: Payer: Self-pay

## 2013-05-16 ENCOUNTER — Inpatient Hospital Stay (HOSPITAL_COMMUNITY)
Admission: EM | Admit: 2013-05-16 | Discharge: 2013-05-21 | DRG: 394 | Disposition: A | Discharge status: Skilled Nursing Facility | Payer: PRIVATE HEALTH INSURANCE | Attending: Internal Medicine | Admitting: Internal Medicine

## 2013-05-16 DIAGNOSIS — I5042 Chronic combined systolic (congestive) and diastolic (congestive) heart failure: Secondary | ICD-10-CM | POA: Diagnosis present

## 2013-05-16 DIAGNOSIS — Y842 Radiological procedure and radiotherapy as the cause of abnormal reaction of the patient, or of later complication, without mention of misadventure at the time of the procedure: Secondary | ICD-10-CM | POA: Diagnosis present

## 2013-05-16 DIAGNOSIS — Z87891 Personal history of nicotine dependence: Secondary | ICD-10-CM

## 2013-05-16 DIAGNOSIS — K6289 Other specified diseases of anus and rectum: Principal | ICD-10-CM | POA: Diagnosis present

## 2013-05-16 DIAGNOSIS — N390 Urinary tract infection, site not specified: Secondary | ICD-10-CM | POA: Diagnosis present

## 2013-05-16 DIAGNOSIS — I10 Essential (primary) hypertension: Secondary | ICD-10-CM | POA: Diagnosis present

## 2013-05-16 DIAGNOSIS — N183 Chronic kidney disease, stage 3 unspecified: Secondary | ICD-10-CM | POA: Diagnosis present

## 2013-05-16 DIAGNOSIS — R531 Weakness: Secondary | ICD-10-CM | POA: Diagnosis present

## 2013-05-16 DIAGNOSIS — K921 Melena: Secondary | ICD-10-CM | POA: Diagnosis present

## 2013-05-16 DIAGNOSIS — Z66 Do not resuscitate: Secondary | ICD-10-CM | POA: Diagnosis present

## 2013-05-16 DIAGNOSIS — Z951 Presence of aortocoronary bypass graft: Secondary | ICD-10-CM

## 2013-05-16 DIAGNOSIS — I25810 Atherosclerosis of coronary artery bypass graft(s) without angina pectoris: Secondary | ICD-10-CM

## 2013-05-16 DIAGNOSIS — R05 Cough: Secondary | ICD-10-CM

## 2013-05-16 DIAGNOSIS — G250 Essential tremor: Secondary | ICD-10-CM | POA: Diagnosis present

## 2013-05-16 DIAGNOSIS — D62 Acute posthemorrhagic anemia: Secondary | ICD-10-CM | POA: Diagnosis present

## 2013-05-16 DIAGNOSIS — I2510 Atherosclerotic heart disease of native coronary artery without angina pectoris: Secondary | ICD-10-CM | POA: Diagnosis present

## 2013-05-16 DIAGNOSIS — D500 Iron deficiency anemia secondary to blood loss (chronic): Secondary | ICD-10-CM | POA: Diagnosis present

## 2013-05-16 DIAGNOSIS — D649 Anemia, unspecified: Secondary | ICD-10-CM | POA: Diagnosis present

## 2013-05-16 DIAGNOSIS — Z7901 Long term (current) use of anticoagulants: Secondary | ICD-10-CM

## 2013-05-16 DIAGNOSIS — M79672 Pain in left foot: Secondary | ICD-10-CM

## 2013-05-16 DIAGNOSIS — I129 Hypertensive chronic kidney disease with stage 1 through stage 4 chronic kidney disease, or unspecified chronic kidney disease: Secondary | ICD-10-CM | POA: Diagnosis present

## 2013-05-16 DIAGNOSIS — I5041 Acute combined systolic (congestive) and diastolic (congestive) heart failure: Secondary | ICD-10-CM

## 2013-05-16 DIAGNOSIS — C61 Malignant neoplasm of prostate: Secondary | ICD-10-CM | POA: Diagnosis present

## 2013-05-16 DIAGNOSIS — R5383 Other fatigue: Secondary | ICD-10-CM | POA: Diagnosis present

## 2013-05-16 DIAGNOSIS — I872 Venous insufficiency (chronic) (peripheral): Secondary | ICD-10-CM | POA: Diagnosis present

## 2013-05-16 DIAGNOSIS — R5381 Other malaise: Secondary | ICD-10-CM | POA: Diagnosis present

## 2013-05-16 DIAGNOSIS — I2789 Other specified pulmonary heart diseases: Secondary | ICD-10-CM | POA: Diagnosis present

## 2013-05-16 DIAGNOSIS — E8770 Fluid overload, unspecified: Secondary | ICD-10-CM

## 2013-05-16 DIAGNOSIS — I4891 Unspecified atrial fibrillation: Secondary | ICD-10-CM | POA: Diagnosis present

## 2013-05-16 DIAGNOSIS — K922 Gastrointestinal hemorrhage, unspecified: Secondary | ICD-10-CM | POA: Diagnosis present

## 2013-05-16 DIAGNOSIS — I509 Heart failure, unspecified: Secondary | ICD-10-CM | POA: Diagnosis present

## 2013-05-16 HISTORY — DX: Hemorrhage of anus and rectum: K62.5

## 2013-05-16 LAB — URINALYSIS, ROUTINE W REFLEX MICROSCOPIC
Ketones, ur: NEGATIVE mg/dL
Nitrite: NEGATIVE
Specific Gravity, Urine: 1.014 (ref 1.005–1.030)
Urobilinogen, UA: 1 mg/dL (ref 0.0–1.0)
pH: 6 (ref 5.0–8.0)

## 2013-05-16 LAB — COMPREHENSIVE METABOLIC PANEL
ALT: 11 U/L (ref 0–53)
BUN: 55 mg/dL — ABNORMAL HIGH (ref 6–23)
Calcium: 8.9 mg/dL (ref 8.4–10.5)
Creatinine, Ser: 1.85 mg/dL — ABNORMAL HIGH (ref 0.50–1.35)
GFR calc Af Amer: 36 mL/min — ABNORMAL LOW (ref >90)
Glucose, Bld: 103 mg/dL — ABNORMAL HIGH (ref 70–99)
Sodium: 140 mEq/L (ref 135–145)
Total Protein: 8.3 g/dL (ref 6.0–8.3)

## 2013-05-16 LAB — DIFFERENTIAL
Basophils Absolute: 0 10*3/uL (ref 0.0–0.1)
Eosinophils Absolute: 0.2 10*3/uL (ref 0.0–0.7)
Eosinophils Relative: 1 % (ref 0–5)
Lymphocytes Relative: 12 % (ref 12–46)
Monocytes Absolute: 0.7 10*3/uL (ref 0.1–1.0)

## 2013-05-16 LAB — CBC WITH DIFFERENTIAL/PLATELET
Basophil %: 0.4 %
HGB: 5.2 g/dL — ABNORMAL LOW (ref 13.0–18.0)
Lymphocyte #: 1.5 10*3/uL (ref 1.0–3.6)
MCH: 27.7 pg (ref 26.0–34.0)
MCHC: 31.8 g/dL — ABNORMAL LOW (ref 32.0–36.0)
MCV: 87 fL (ref 80–100)
Monocyte %: 5.2 %
Neutrophil #: 8.5 10*3/uL — ABNORMAL HIGH (ref 1.4–6.5)
Neutrophil %: 79.1 %
Platelet: 264 10*3/uL (ref 150–440)
RDW: 19.6 % — ABNORMAL HIGH (ref 11.5–14.5)
WBC: 10.8 10*3/uL — ABNORMAL HIGH (ref 3.8–10.6)

## 2013-05-16 LAB — CBC
Hemoglobin: 5.5 g/dL — CL (ref 13.0–17.0)
MCH: 27.8 pg (ref 26.0–34.0)
MCHC: 30.9 g/dL (ref 30.0–36.0)

## 2013-05-16 LAB — URINE MICROSCOPIC-ADD ON

## 2013-05-16 MED ORDER — SODIUM CHLORIDE 0.9 % IV SOLN
INTRAVENOUS | Status: DC
  Administered 2013-05-16: via INTRAVENOUS

## 2013-05-16 MED ORDER — FUROSEMIDE 10 MG/ML IJ SOLN
40.0000 mg | Freq: Once | INTRAMUSCULAR | Status: AC
  Administered 2013-05-17: 40 mg via INTRAVENOUS
  Filled 2013-05-16: qty 4

## 2013-05-16 MED ORDER — SIMVASTATIN 20 MG PO TABS
20.0000 mg | ORAL_TABLET | Freq: Every day | ORAL | Status: DC
  Administered 2013-05-17 – 2013-05-20 (×4): 20 mg via ORAL
  Filled 2013-05-16 (×5): qty 1

## 2013-05-16 MED ORDER — FUROSEMIDE 40 MG PO TABS
40.0000 mg | ORAL_TABLET | Freq: Every day | ORAL | Status: DC
  Administered 2013-05-17 – 2013-05-21 (×5): 40 mg via ORAL
  Filled 2013-05-16 (×5): qty 1

## 2013-05-16 MED ORDER — METOPROLOL TARTRATE 50 MG PO TABS
50.0000 mg | ORAL_TABLET | Freq: Two times a day (BID) | ORAL | Status: DC
  Administered 2013-05-17 – 2013-05-21 (×10): 50 mg via ORAL
  Filled 2013-05-16 (×11): qty 1

## 2013-05-16 MED ORDER — ACETAMINOPHEN 325 MG PO TABS
650.0000 mg | ORAL_TABLET | Freq: Four times a day (QID) | ORAL | Status: DC | PRN
  Administered 2013-05-17 – 2013-05-20 (×4): 650 mg via ORAL
  Filled 2013-05-16 (×4): qty 2

## 2013-05-16 MED ORDER — IRBESARTAN 75 MG PO TABS
75.0000 mg | ORAL_TABLET | Freq: Every day | ORAL | Status: DC
  Administered 2013-05-17 – 2013-05-21 (×5): 75 mg via ORAL
  Filled 2013-05-16 (×5): qty 1

## 2013-05-16 MED ORDER — PANTOPRAZOLE SODIUM 40 MG IV SOLR
40.0000 mg | Freq: Every day | INTRAVENOUS | Status: DC
  Administered 2013-05-17: 40 mg via INTRAVENOUS
  Filled 2013-05-16 (×2): qty 40

## 2013-05-16 NOTE — ED Notes (Signed)
Attempt to give report, IP CN not ready yet

## 2013-05-16 NOTE — ED Notes (Signed)
Pt also has subcutaneous line

## 2013-05-16 NOTE — ED Notes (Signed)
[id number]<AV> Expected date:<BR> Expected time:<BR> Means of arrival:<BR> Comments:<BR> ems- 79 yo , rectal bleeding

## 2013-05-16 NOTE — ED Provider Notes (Signed)
History    CSN: [id number] Arrival date & time 05/16/13  1731  First MD Initiated Contact with Patient 05/16/13 1734     Chief Complaint  Patient presents with  . Rectal Bleeding  . Abnormal Lab   (Consider location/radiation/quality/duration/timing/severity/associated sxs/prior Treatment) HPI BASIM BARTNIK is a(n) 79 y.o. male with history of CAD s/p CABG, chronic AFIB, chf, CKD and ulcerative colitis who presents from [facility] assisted living for rectal bleeding and fould odor of urine. Patient is a poor historian and history is gathered from medical records and patient.  He is s/p hospital admission with lower gi bleed last month discharged on 04/19/2013. He  received 2 u prbc transfusion, fereheme and aransep with resolution of bleeding. Gi was consulted and there were concerns for diverticular bleed. Blood thinners discontinued. Patient also had probl;ems with volume overload and weakness and he was placed in SNF.  Patient states that there where large clots of blood in his adult diaper today. He says that there has also been blood in his urine cath. He also c/o weakness and unable to walk whish has been present since his admission. He denies chest pain or sob.   Past Medical History  Diagnosis Date  . Hypertension   . History of prostate cancer 1996    treated with radiation  . Arthritis   . Gout, arthritis 2010    bil feet  . Cancer     PORSTATE-HX OF RADIATION AND SURGERY  . Diverticulosis of colon   . CAD (coronary artery disease)     cabg 1996; echo 03/15/2007-EF 45-50%, LA mod to severe dilated, mod TR, mod pulmonary HTN, mod sclerotic aortic valve; myoview6/23/2010- no ischemia  . A-fib     chronic, no longer on coumadin due to rectal bleeding  . Hyperlipidemia   . Rectal bleed    Past Surgical History  Procedure Laterality Date  . Coronary artery bypass graft  04/1995    LIMA to LAD, vein graft to diag branch, vein to 3 sequential marginal branches, and vein to  PDA  . Appendectomy  1950s  . Eye surgery  2009    cataract surgery  . Cryoablation of prostate  06/23/2005  . Colonoscopy  11/03/2000  . Cardiac catheterization  05/04/2005    patent grafts  . Cardiac catheterization  04/10/1995    presyncopal episode on way to OR, 3V disease with left main involvement, nl LV, CVTS was contacted   Family History  Problem Relation Age of Onset  . Heart Problems Mother   . Heart Problems Father   . Cancer Brother   . Cancer Sister    History  Substance Use Topics  . Smoking status: Former Smoker    Quit date: 09/26/1984  . Smokeless tobacco: Never Used  . Alcohol Use: No    Review of Systems ROS limited to that stated in HPI. Difficult to perform detailed ROS due to patients communication ability.   Allergies  Ancef; Ace inhibitors; Penicillins cross reactors; and Sulfa antibiotics  Home Medications   Current Outpatient Rx  Name  Route  Sig  Dispense  Refill  . acetaminophen (TYLENOL) 325 MG tablet   Oral   Take 2 tablets (650 mg total) by mouth every 6 (six) hours as needed.         Marland Kitchen aspirin 81 MG tablet      Will discuss ASA vrs Eloquis.  Do not resume ASA: Take 1 tablet (81 mg total) by mouth daily  until after We discuss as an outpatient..   30 tablet      . calcium carbonate (TUMS - DOSED IN MG ELEMENTAL CALCIUM) 500 MG chewable tablet   Oral   Chew 1 tablet by mouth as needed.         . furosemide (LASIX) 40 MG tablet   Oral   Take 40 mg by mouth daily.         Marland Kitchen lovastatin (MEVACOR) 40 MG tablet   Oral   Take 40 mg by mouth at bedtime.           . metoprolol (LOPRESSOR) 50 MG tablet   Oral   Take 50 mg by mouth 2 (two) times daily.           . valsartan (DIOVAN) 80 MG tablet   Oral   Take 80 mg by mouth daily.          BP 121/68  Pulse 94  Temp(Src) 98.7 F (37.1 C) (Oral)  Resp 16  SpO2 96% Physical Exam  Nursing note and vitals reviewed. Constitutional: He appears well-developed and  well-nourished. No distress.  HENT:  Head: Normocephalic and atraumatic.  Eyes: No scleral icterus.  Pale conunctiva  Neck: Neck supple.  Cardiovascular: Normal rate, regular rhythm and normal heart sounds.   Pulmonary/Chest: Effort normal and breath sounds normal. No respiratory distress.  Abdominal: Soft. There is no tenderness.  Genitourinary:  Urine cath is in place Blood and purulence noted in the cath and l;eg bag,   Musculoskeletal: He exhibits no edema.  Neurological: He is alert.  Skin: Skin is warm and dry. He is not diaphoretic.  Pale, sallow skin. Blister on left 2nd digit. Erythema, pitting edema of LEs with changes of chronic venous stasis dermatitis without cellulitis.  Psychiatric: His behavior is normal.    ED Course  Procedures (including critical care time) Labs Reviewed  CBC - Abnormal; Notable for the following:    WBC 14.6 (*)    RBC 1.94 (*)    Hemoglobin 5.5 (*)    HCT 17.5 (*)    RDW 19.7 (*)    All other components within normal limits  COMPREHENSIVE METABOLIC PANEL - Abnormal; Notable for the following:    Glucose, Bld 103 (*)    BUN 55 (*)    Creatinine, Ser 1.85 (*)    Albumin 2.2 (*)    Alkaline Phosphatase 205 (*)    GFR calc non Af Amer 31 (*)    GFR calc Af Amer 36 (*)    All other components within normal limits  DIFFERENTIAL - Abnormal; Notable for the following:    Neutrophils Relative % 82 (*)    Neutro Abs 12.3 (*)    All other components within normal limits  CBC WITH DIFFERENTIAL  URINALYSIS, ROUTINE W REFLEX MICROSCOPIC  TYPE AND SCREEN  PREPARE RBC (CROSSMATCH)   No results found. 1. GI bleed   2. Anemia     MDM  7:08 PM BP 121/68  Pulse 94  Temp(Src) 98.7 F (37.1 C) (Oral)  Resp 16  SpO2 96% Patient with hgb 5.5. Creatinine stable. UA pending.  Type and screen pending with 2 units RBC ordered for transfusion.  Patient is hemodynamically stable.  I have spoken with Dr. Felipa Eth who will admit the  patient.   Arthor Captain, PA-C 05/17/13 1733

## 2013-05-16 NOTE — ED Notes (Signed)
Pt from [facility]. Had rectal bleeding 6/19 and is having rectal bleeding again since this am.  Per nursing home staff he had one moderate blood clot in stool early this am and again a large blood clot at 1000 this am.  Labs were drawn at facility and hgb came back 5.5, normally 8s. Also has foul smelling urine in foley

## 2013-05-16 NOTE — H&P (Signed)
PCP:   Gwen Pounds, MD   Chief Complaint:  Bloody clots per rectum  HPI: This is an 79 year old male who [facility], with a history of CAD status post CABG, chronic A. fib, CHF, chronic kidney disease, history of prostate cancer status post radiation with history of radiation proctitis who was recently admitted for lower GI bleed approximately one month ago and did receive 2 units of packed red blood cells accompanied by some evidence of volume overload requiring diuresis, bleeding resolved spontaneously, patient has remained off of anticoagulants but does continue on aspirin while at skilled nursing facility, has remained hemodynamically stable however has had significant issues with blood in his urinary catheter, was evaluated approximately 2 weeks go by [facility], indwelling Foley catheter replaced, but continued to have clots intermittently but mostly bloody discharge, that cleared 2 days ago per her daughter's report. However clots reappeared earlier today, catheter has been replaced since my evaluation in the emergency room. Currently draining clear urine. In emergency room, agents baseline hemoglobin noted to be in the age range, however was found to be 5.5 after significant clots found in the patient's diaper. Auscultable admit the patient, the patient has remained hemodynamically stable, maintains a DO NOT RESUSCITATE CODE STATUS, 2 daughters are present to confirm the same along with the patient. See review of systems  Review of Systems:  Patient denies decrease in appetite, nausea, vomiting, fever, chills, visual changes, difficulty swallowing, chest pain, palpitations, shortness of breath but does admit to chronic cough for which he is received some nebulizer treatments in the past, denies any abdominal discomfort, rectal pain, states that he is incontinent of both bowel and bladder and wears a diaper chronically, has been working with physical  therapy, ambulating with assistance using a walker, lower extremity edema has improved, history of weeping, and stasis dermatitis. Occasional low back pain which is unchanged, and intermittent and nonprogressive without radicular symptoms. Denies any problems with diarrhea, constipation, or hard bowel movements.  Past Medical History: Past Medical History  Diagnosis Date  . Hypertension   . History of prostate cancer 1996    treated with radiation  . Arthritis   . Gout, arthritis 2010    bil feet  . Cancer     PORSTATE-HX OF RADIATION AND SURGERY  . Diverticulosis of colon   . CAD (coronary artery disease)     cabg 1996; echo 03/15/2007-EF 45-50%, LA mod to severe dilated, mod TR, mod pulmonary HTN, mod sclerotic aortic valve; myoview6/23/2010- no ischemia  . A-fib     chronic, no longer on coumadin due to rectal bleeding  . Hyperlipidemia   . Rectal bleed    Past Surgical History  Procedure Laterality Date  . Coronary artery bypass graft  04/1995    LIMA to LAD, vein graft to diag branch, vein to 3 sequential marginal branches, and vein to PDA  . Appendectomy  1950s  . Eye surgery  2009    cataract surgery  . Cryoablation of prostate  06/23/2005  . Colonoscopy  11/03/2000  . Cardiac catheterization  05/04/2005    patent grafts  . Cardiac catheterization  04/10/1995    presyncopal episode on way to OR, 3V disease with left main involvement, nl LV, CVTS was contacted    Medications: Prior to Admission medications   Medication Sig Start Date End Date Taking? Authorizing Provider  acetaminophen (TYLENOL) 325 MG tablet Take 2 tablets (650 mg total) by mouth every  6 (six) hours as needed. 04/18/13  Yes Gwen Pounds, MD  aspirin 81 MG tablet Will discuss ASA vrs Eloquis.  Do not resume ASA: Take 1 tablet (81 mg total) by mouth daily until after We discuss as an outpatient.. 04/18/13  Yes Gwen Pounds, MD  calcium carbonate (TUMS - DOSED IN MG ELEMENTAL CALCIUM) 500 MG chewable tablet Chew 1  tablet by mouth as needed.   Yes Historical Provider, MD  furosemide (LASIX) 40 MG tablet Take 40 mg by mouth daily.   Yes Historical Provider, MD  lovastatin (MEVACOR) 40 MG tablet Take 40 mg by mouth at bedtime.     Yes Historical Provider, MD  metoprolol (LOPRESSOR) 50 MG tablet Take 50 mg by mouth 2 (two) times daily.     Yes Historical Provider, MD  valsartan (DIOVAN) 80 MG tablet Take 80 mg by mouth daily.   Yes Historical Provider, MD    Allergies:   Allergies  Allergen Reactions  . Ancef (Cefazolin) Anaphylaxis  . Ace Inhibitors Cough  . Penicillins Cross Reactors     Anaphylaxix/don't give anything in the "family" of penicillins  . Sulfa Antibiotics     Don't remember. Didn't want to give me anymore    Social History:  reports that he quit smoking about 28 years ago. He has never used smokeless tobacco. He reports that he does not drink alcohol or use illicit drugs. patient has 2 daughters that live locally, is retired and widowed Family History: Family History  Problem Relation Age of Onset  . Heart Problems Mother   . Heart Problems Father   . Cancer Brother   . Cancer Sister     Physical Exam: Filed Vitals:   05/16/13 1742 05/16/13 1945  BP: 121/68 125/63  Pulse: 94 83  Temp: 98.7 F (37.1 C)   TempSrc: Oral   Resp: 16 16  SpO2: 96% 98%   Physical exam No apparent distress answering all questions appropriately, daughter verifies responses Sclera anicteric extraconal movements appear intact face is symmetric, no oropharyngeal lesions Neck is supple, no cervical lymphadenopathy,  Lungs are clear to auscultation, no respiratory distress, no rhonchi appreciated no bronchospasm appreciated Cardiovascular reveals regular rate and rhythm Abdomen reveals a soft nontender nondistended abdomen bowel sounds are present no hepatosplenomegaly is appreciated Urine catheter is in place draining clear yellow urine Rectal exam not evaluated given significant blood and  clots found by ER physician Lower extremity reveal trace edema, skin is somewhat pale, no evidence of significant skin breakdown but there are chronic venous stasis changes with dermatitis Patient is alert, answering all questions appropriately moves all 4 extremities upon command   Labs on Admission:   Recent Labs  05/16/13 1800  NA 140  K 4.4  CL 106  CO2 26  GLUCOSE 103*  BUN 55*  CREATININE 1.85*  CALCIUM 8.9    Recent Labs  05/16/13 1800  AST 16  ALT 11  ALKPHOS 205*  BILITOT 0.4  PROT 8.3  ALBUMIN 2.2*   No results found for this basename: LIPASE, AMYLASE,  in the last 72 hours  Recent Labs  05/16/13 1800  WBC 14.6*  NEUTROABS 12.3*  HGB 5.5*  HCT 17.5*  MCV 90.2  PLT 273   No results found for this basename: CKTOTAL, CKMB, CKMBINDEX, TROPONINI,  in the last 72 hours No results found for this basename: TSH, T4TOTAL, FREET3, T3FREE, THYROIDAB,  in the last 72 hours No results found for this basename: VITAMINB12, FOLATE,  FERRITIN, TIBC, IRON, RETICCTPCT,  in the last 72 hours  Radiological Exams on Admission: No results found. Orders placed in visit on 05/01/13  . EKG 12-LEAD    Assessment/Plan GI bleed-presumably diverticular versus radiation proctitis induced, currently hemodynamically stable but with some clots in the rectum per ER evaluation, will monitor his serial CBCs, transfuse with 2 units of packed red blood cells, this was discussed with the patient as well as the daughters and including risk of transfusion adverse events. Patient currently hemodynamically stable, will treat with Lasix in between 2 units given history of congestive heart failure, last ejection fraction reasonable from admission one month ago; if patient becomes unstable or develops progressive breathing, will consult GI, however given multiple comorbidities we'll defer aggressive intervention at this time. Chronic kidney disease-stable if not slightly better compared with baseline  we'll continue to monitor with morning blood work Congestive heart failure-no evidence of overt volume overload at this time, we'll need to watch carefully with packed red blood cell transfusion, daily weights and input and output ordered, chest x-ray pending, if patient becomes unstable we'll consult Dr. Allyson Sabal Atrial fibrillation-transient, sinus rhythm based on telemetry, hemodynamically stable with good rate control Hypertension-controlled right now we'll hold blood pressure medications for systolic blood pressure less than 120 Prostate cancer-history of radiation, question otitis with bleed  DO NOT RESUSCITATE CODE STATUS No DVT prophylaxis with anticoagulation given GI bleed but will provide SCDs Aryelle Figg R 05/16/2013, 8:13 PM

## 2013-05-16 NOTE — Progress Notes (Signed)
Clinical Social Work Department BRIEF PSYCHOSOCIAL ASSESSMENT 05/16/2013  Patient:  [name] [name], [name] [name]     Account Number:  [id number]     Admit date:  05/16/2013  Clinical Social Worker:  Lovenia Shuck  Date/Time:  05/16/2013 08:50 PM  Referred by:  Physician  Date Referred:  05/16/2013 Referred for  SNF Placement   Other Referral:   Interview type:  Patient Other interview type:    PSYCHOSOCIAL DATA Living Status:  FACILITY Admitted from facility:  [facility] Level of care:  Skilled Nursing Facility Primary support name:  [name] Primary support relationship to patient:  CHILD, ADULT Degree of support available:   Strong.  [name], daughter    CURRENT CONCERNS Current Concerns  Post-Acute Placement   Other Concerns:    SOCIAL WORK ASSESSMENT / PLAN CSW met with pt at bedside.  Confirmed that he was from [facility] skilled nursing facility and that he plans to return when medically stable.   Pt shared that his two daughters had just stepped out and gave CSW permission to speak with them.   Assessment/plan status:  Psychosocial Support/Ongoing Assessment of Needs Other assessment/ plan:   Information/referral to community resources:   None identified at this time.    PATIENT'S/FAMILY'S RESPONSE TO PLAN OF CARE: Pt thank CSW for concern and support.  Pt plans to return to [facility] when medically stable.   [name], LCSWA    [phone] 05/16/2013 9:00 pm

## 2013-05-16 NOTE — ED Notes (Signed)
Subcutaneous line removed.

## 2013-05-17 ENCOUNTER — Inpatient Hospital Stay (HOSPITAL_COMMUNITY): Payer: PRIVATE HEALTH INSURANCE

## 2013-05-17 LAB — PREPARE RBC (CROSSMATCH)

## 2013-05-17 LAB — COMPREHENSIVE METABOLIC PANEL
BUN: 49 mg/dL — ABNORMAL HIGH (ref 6–23)
CO2: 26 mEq/L (ref 19–32)
Calcium: 9.1 mg/dL (ref 8.4–10.5)
Chloride: 108 mEq/L (ref 96–112)
Creatinine, Ser: 1.77 mg/dL — ABNORMAL HIGH (ref 0.50–1.35)
GFR calc non Af Amer: 32 mL/min — ABNORMAL LOW (ref >90)
Total Bilirubin: 0.4 mg/dL (ref 0.3–1.2)

## 2013-05-17 LAB — CBC
HCT: 24.1 % — ABNORMAL LOW (ref 39.0–52.0)
MCH: 28.4 pg (ref 26.0–34.0)
MCV: 89.9 fL (ref 78.0–100.0)
RDW: 17.8 % — ABNORMAL HIGH (ref 11.5–15.5)
WBC: 9.5 10*3/uL (ref 4.0–10.5)

## 2013-05-17 MED ORDER — FUROSEMIDE 10 MG/ML IJ SOLN
40.0000 mg | Freq: Once | INTRAMUSCULAR | Status: AC
  Administered 2013-05-17: 40 mg via INTRAVENOUS
  Filled 2013-05-17: qty 4

## 2013-05-17 MED ORDER — PANTOPRAZOLE SODIUM 40 MG PO TBEC
40.0000 mg | DELAYED_RELEASE_TABLET | Freq: Every day | ORAL | Status: DC
  Administered 2013-05-17 – 2013-05-21 (×3): 40 mg via ORAL
  Filled 2013-05-17 (×5): qty 1

## 2013-05-17 MED ORDER — CIPROFLOXACIN HCL 250 MG PO TABS
250.0000 mg | ORAL_TABLET | Freq: Two times a day (BID) | ORAL | Status: DC
  Administered 2013-05-17 – 2013-05-21 (×9): 250 mg via ORAL
  Filled 2013-05-17 (×10): qty 1

## 2013-05-17 NOTE — Progress Notes (Signed)
CSW confirmed with Tammie @ [facility] that they will be able to return when ready.   Unice Bailey, LCSW [facility] Clinical Social Worker cell #: [phone]

## 2013-05-17 NOTE — Progress Notes (Signed)
Attemped to see pt today. Pt with HGB of 5.5 on arrival.  Now at 7.6 but scheduled to get more blood this afternoon.  Pt from [facility] and will return there.  Pt currently Eating lunch.  Will attempt back as schedule allows. Tory Emerald, [place] [phone]

## 2013-05-17 NOTE — Progress Notes (Signed)
Subjective: Admitted from SNF for 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 Sxatic anemia and Hbg on admit was 5.5. Getting 2 units PRBCs and will need more No more bleeding since admit. Room smells of GIB. He was making progress at Rehab until became weak. No CP/SOB. Edema is down   Objective: Vital signs in last 24 hours: Temp:  [97.6 F (36.4 C)-98.7 F (37.1 C)] 97.6 F (36.4 C) (07/18 0600) Pulse Rate:  [68-99] 75 (07/18 0500) Resp:  [16-20] 20 (07/18 0600) BP: (108-131)/(38-86) 131/38 mmHg (07/18 0600) SpO2:  [96 %-98 %] 96 % (07/17 2149) Weight:  [77.6 kg (171 lb 1.2 oz)] 77.6 kg (171 lb 1.2 oz) (07/17 2149) Weight change:  Last BM Date: 05/16/13  CBG (last 3)  No results found for this basename: GLUCAP,  in the last 72 hours  Intake/Output from previous day:  Intake/Output Summary (Last 24 hours) at 05/17/13 0643 Last data filed at 05/17/13 0600  Gross per 24 hour  Intake    625 ml  Output    325 ml  Net    300 ml   07/17 0701 - 07/18 0700 In: 625 [Blood:625] Out: 325 [Urine:325]   Physical Exam  General appearance: alert, no distress and coarse tremor  Neck: Min JVD  Lungs: Min rhonchi bilaterally ow clear  Heart: irregularly irregular rhythm, soft m, Sternotomy  Abdomen: soft, non-tender; bowel sounds normal; no masses, no organomegaly and distended  Extremities: edema now tr with resolving venous stasis.  Squeezers on Pulses: 2+ and symmetric  Neurologic: Mental status: Alert, oriented, thought content appropriate, difficult hearing, coarse upper extremity intention tremor    Lab Results:  Recent Labs  05/16/13 1800  NA 140  K 4.4  CL 106  CO2 26  GLUCOSE 103*  BUN 55*  CREATININE 1.85*  CALCIUM 8.9     Recent Labs  05/16/13 1800  AST 16  ALT 11  ALKPHOS 205*  BILITOT 0.4  PROT 8.3  ALBUMIN 2.2*     Recent Labs  05/16/13 1800  WBC 14.6*  NEUTROABS 12.3*  HGB 5.5*  HCT 17.5*  MCV 90.2  PLT 273     Lab Results  Component Value Date   INR 1.15 04/16/2013    No results found for this basename: CKTOTAL, CKMB, CKMBINDEX, TROPONINI,  in the last 72 hours  No results found for this basename: TSH, T4TOTAL, FREET3, T3FREE, THYROIDAB,  in the last 72 hours  No results found for this basename: VITAMINB12, FOLATE, FERRITIN, TIBC, IRON, RETICCTPCT,  in the last 72 hours  Micro Results: No results found for this or any previous visit (from the past 240 hour(s)).   Studies/Results: No results found.   Medications: Scheduled: . furosemide  40 mg Oral Daily  . irbesartan  75 mg Oral Daily  . metoprolol  50 mg Oral BID  . pantoprazole  40 mg Oral Q0600  . simvastatin  20 mg Oral q1800   Continuous: . sodium chloride 50 mL/hr at 05/16/13 2331     Assessment/Plan: Active Problems:   Lower GI bleed   CKD (chronic kidney disease) stage 3, GFR 30-59 ml/min   Anemia   HTN (hypertension)   Weakness generalized  Lower GI bleed- diverticular versus radiation proctitis -  hemodynamically stable - Continue to monitor CBC and transfuse to get Hbg > 9.  S/p 2 units of packed red blood cells. Lasix in between 2 units given history of congestive heart failure.  Conservative management preferred.  If patient becomes unstable or develops progressive breathing, will consult GI, however given multiple comorbidities we'll defer aggressive intervention at this time. Last Colon was 05/2006.  If bleeding returns with abdominal pain or diarrhea or bleeding persists then may need to add Rowasa enemas or PO mesalamine. No Lovenox/Coumadin/ASA for now. Supportive Care. Hbg on D/C 6/19 was 9.1. protonix restarted but ?Need.  Chronic kidney disease stage 3 -c Cr in June 1.91 - 2.17.  Currently 1.85 and stable.   Congestive heart failure with known Systolic/Diastolic Dysfunction. Severe Valvular Dz c Severe Pulm HTN. Venous insufficiency and Stasis Dermatitis also noted. No Pulm Edema on CXR.  Dr Allyson Sabal is his  cardiologist.  Past studies include: ECHO 2008 showed EF 45% c Mod Pulm HTN. Last Myoview was 2010 and Non-Ischemic. Known AFib.  ECHO 03/2013 shows - EF 55% to 60%. Diastolic Dysfunction and Severe Pulmonary HTN, moderate Aortic stenosis. Mod MR, LA/RA Severely Dillitated, TR.  Current goal is to diureseas needed to maintain Euvolemia.   PAFib - This seals it and he is not a candidate for anticoagualation despite his CHADS2 Score.  Current EKG is AFib c PVC - Rate controlled.  ANEMIA due to chronic Dz, Iron Deficiency and acute blood loss from the GIB. GettingTransfusion.  After Hbg stable will consider redosing with, Aranesp 80 and Fereheme - follow Hbg.  CORONARY ARTERY DISEASE/s/p CABG -  No Angina.   Hypertension-controlled right now we'll hold blood pressure medications for systolic blood pressure less than 120   Prostate cancer-history of radiation, question proctitis with bleed   DO NOT RESUSCITATE CODE STATUS   No DVT prophylaxis with anticoagulation given GI bleed but will provide SCDs if he tolerates better this time  Deconditioning - PT/OT/CW.  Pt will get blood over weekend.  Start PT/OT and work on transitioning back to SNF for rehab by Monday??  UTI c Leukoctosis - start Abx = Cipro 250 BID for 5 days.  Now has indwelling foley - it was changed out in ED. May try and wean??  Trial of clamping   LOS: 1 day   Amiel Sharrow M 05/17/2013, 6:43 AM

## 2013-05-17 NOTE — Progress Notes (Signed)
Called lab regarding the CBC with diff order scheduled for  2200 and was reported that their system is down and I was unaware of this.  I spoke with Shanda Bumps on the phone and she said they will come and draw the labs STAT.    Ernesta Amble, RN

## 2013-05-17 NOTE — Evaluation (Signed)
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  Clinical Impression  Pt will benefit from PT due to deficits below; Pt was at [facility] since last hospital adm, plan is to return there    PT Assessment  Patient needs continued PT services    Follow Up Recommendations  SNF (return to [facility])    Does the patient have the potential to tolerate intense rehabilitation      Barriers to Discharge        Equipment Recommendations  None recommended by PT    Recommendations for Other Services     Frequency Min 3X/week    Precautions / Restrictions Precautions Precautions: Fall Precaution Comments: decreased Hgb   Pertinent Vitals/Pain Sats 100% on RA HR 62      Mobility  Bed Mobility Bed Mobility: Supine to Sit Supine to Sit: 4: Min guard Details for Bed Mobility Assistance: increased time and cues for self assist with UEs/technique Transfers Transfers: Sit to Stand;Stand to Sit;Stand Pivot Transfers Sit to Stand: 4: Min assist Stand to Sit: 4: Min assist Stand Pivot Transfers: 4: Min assist (bed to chair with RW, ~8 steps) Details for Transfer Assistance: pt very weak and unsteady; cues for hand placement and technique/RW safety; requires increased time to complete transfer    Exercises     PT Diagnosis: Difficulty walking;Generalized weakness  PT Problem List: Decreased strength;Decreased activity tolerance;Decreased balance;Decreased mobility;Decreased knowledge of use of DME PT Treatment Interventions: DME instruction;Gait training;Functional mobility training;Therapeutic activities;Balance training;Patient/family education;Therapeutic exercise     PT Goals(Current goals can be found in  the care plan section) Acute Rehab PT Goals Patient Stated Goal: i want to walk again PT Goal Formulation: With patient Time For Goal Achievement: 05/31/13 Potential to Achieve Goals: Good  Visit Information  Last PT Received On: 05/17/13 Assistance Needed: +1 History of Present Illness: Admitted from SNF for 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       Prior Functioning  Home Living Family/patient expects to be discharged to:: Skilled nursing facility Home Equipment: Gilmer Mor - single point;Walker - 2 wheels Additional Comments: pt was at [facility] Prior Function Comments: amb with RW and assist Communication Communication: HOH    Cognition  Cognition Arousal/Alertness: Awake/alert Behavior During Therapy: WFL for tasks assessed/performed Overall Cognitive Status: Within Functional Limits for tasks assessed    Extremity/Trunk Assessment Upper Extremity Assessment Upper Extremity Assessment: Generalized weakness Lower Extremity Assessment Lower Extremity Assessment: Generalized weakness   Balance Static Sitting Balance Static Sitting - Balance Support: No upper extremity supported;Feet supported Static Sitting - Level of Assistance: 4: Min assist;5: Stand by assistance Static Standing Balance Static Standing - Balance Support: Bilateral upper extremity supported;During functional activity Static Standing - Level of Assistance: 4: Min assist  End of Session PT - End of Session Equipment Utilized During Treatment: Gait belt Activity Tolerance: Patient limited by fatigue Patient left: in chair;with call bell/phone within reach;with nursing/sitter in room  GP     [facility] 05/17/2013, 10:25 AM

## 2013-05-17 NOTE — Progress Notes (Signed)
Patient had a large bright red bowel movement at 19:30, which is his first since being admitted to unit.  I paged the on call attending physician for Dr. Timothy Lasso just as an FYI.  Post blood transfusion labs are scheduled for 22:00 tonight.  Will continue to monitor patient and report any changes.  Ernesta Amble, RN

## 2013-05-17 NOTE — Care Management Note (Addendum)
    Page 1 of 1   05/20/2013     3:47:01 PM   CARE MANAGEMENT NOTE 05/20/2013  Patient:  [name] [name], [name] [name]   Account Number:  [id number]  Date Initiated:  05/17/2013  Documentation initiated by:  Lanier Clam  Subjective/Objective Assessment:   ADMITTED W/LOWER GIB.ZO:[id number],JYNWGNFA CA,CHF,AFIB,CKD.READMIT 6/17-6/19/14.     Action/Plan:   FROM SNF-[facility]   Anticipated DC Date:  05/20/2013   Anticipated DC Plan:  SKILLED NURSING FACILITY      DC Planning Services  CM consult      Choice offered to / List presented to:             Status of service:  Completed, signed off Medicare Important Message given?   (If response is "NO", the following Medicare IM given date fields will be blank) Date Medicare IM given:   Date Additional Medicare IM given:    Discharge Disposition:  SKILLED NURSING FACILITY  Per UR Regulation:  Reviewed for med. necessity/level of care/duration of stay  If discussed at Long Length of Stay Meetings, dates discussed:    Comments:  05/20/13 Kenon Delashmit RN,BSN NCM [phone] D/C SNF.  05/17/13 Irving Bloor RN,BSN NCM [phone] PT-SNF.

## 2013-05-18 LAB — COMPREHENSIVE METABOLIC PANEL
AST: 12 U/L (ref 0–37)
Albumin: 2.2 g/dL — ABNORMAL LOW (ref 3.5–5.2)
Alkaline Phosphatase: 174 U/L — ABNORMAL HIGH (ref 39–117)
BUN: 45 mg/dL — ABNORMAL HIGH (ref 6–23)
Chloride: 101 mEq/L (ref 96–112)
Potassium: 3.6 mEq/L (ref 3.5–5.1)
Total Bilirubin: 0.6 mg/dL (ref 0.3–1.2)

## 2013-05-18 LAB — CBC WITH DIFFERENTIAL/PLATELET
Basophils Absolute: 0 10*3/uL (ref 0.0–0.1)
Eosinophils Absolute: 0.1 10*3/uL (ref 0.0–0.7)
Eosinophils Relative: 1 % (ref 0–5)
MCH: 29.2 pg (ref 26.0–34.0)
MCHC: 32.6 g/dL (ref 30.0–36.0)
MCV: 89.5 fL (ref 78.0–100.0)
Platelets: 222 10*3/uL (ref 150–400)
RDW: 16.9 % — ABNORMAL HIGH (ref 11.5–15.5)

## 2013-05-18 LAB — CBC
HCT: 27.3 % — ABNORMAL LOW (ref 39.0–52.0)
HCT: 33.4 % — ABNORMAL LOW (ref 39.0–52.0)
Hemoglobin: 8.7 g/dL — ABNORMAL LOW (ref 13.0–17.0)
MCHC: 32 g/dL (ref 30.0–36.0)
MCV: 89.5 fL (ref 78.0–100.0)
RDW: 17.2 % — ABNORMAL HIGH (ref 11.5–15.5)
RDW: 17.8 % — ABNORMAL HIGH (ref 11.5–15.5)
WBC: 10.3 10*3/uL (ref 4.0–10.5)

## 2013-05-18 LAB — PRO B NATRIURETIC PEPTIDE: Pro B Natriuretic peptide (BNP): 15727 pg/mL — ABNORMAL HIGH (ref 0–450)

## 2013-05-18 MED ORDER — FUROSEMIDE 10 MG/ML IJ SOLN
INTRAMUSCULAR | Status: AC
  Filled 2013-05-18: qty 4

## 2013-05-18 MED ORDER — ACETAMINOPHEN 325 MG PO TABS
650.0000 mg | ORAL_TABLET | Freq: Once | ORAL | Status: AC
  Administered 2013-05-18: 650 mg via ORAL
  Filled 2013-05-18: qty 1

## 2013-05-18 MED ORDER — FUROSEMIDE 10 MG/ML IJ SOLN
20.0000 mg | Freq: Once | INTRAMUSCULAR | Status: AC
  Administered 2013-05-18: 20 mg via INTRAVENOUS

## 2013-05-18 NOTE — Evaluation (Signed)
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   Clinical Impression   Pt presents to OT with decreased I with ADL activity due to problems below. Pt will benefit from skilled OT to increase I with ADL activity and return to PLOF    OT Assessment  Patient needs continued OT Services    Follow Up Recommendations  SNF             Frequency  Min 2X/week    Precautions / Restrictions Precautions Precautions: Fall Precaution Comments: decreased Hgb       ADL  Eating/Feeding: Performed;Independent Where Assessed - Eating/Feeding: Edge of bed Grooming: Performed;Wash/dry hands;Wash/dry face Where Assessed - Grooming: Unsupported sitting Toilet Transfer: +1 Total assistance Toilet Transfer Method: Sit to stand;Other (comment) (from bed) Toileting - Clothing Manipulation and Hygiene: +1 Total assistance Where Assessed - Toileting Clothing Manipulation and Hygiene: Standing    OT Diagnosis: Generalized weakness  OT Problem List: Decreased strength;Decreased safety awareness;Impaired balance (sitting and/or standing);Decreased activity tolerance OT Treatment Interventions: Self-care/ADL training;Patient/family education   OT Goals(Current goals can be found in the care plan section) Acute Rehab OT Goals OT Goal Formulation: With patient Time For Goal Achievement: 06/01/13  Visit Information  Last OT Received On: 05/18/13 History of Present Illness: Admitted from SNF for 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       Prior Functioning     Home Living Additional Comments: pt was at [facility]- had been there for 3 weeks for rehab.  Prior Function Level of Independence:  Independent with assistive device(s) Communication Communication: No difficulties;HOH         Vision/Perception Vision - History Baseline Vision: Wears glasses all the time Patient Visual Report: No change from baseline Vision - Assessment Eye Alignment: Within Functional Limits   Cognition  Cognition Arousal/Alertness: Awake/alert Behavior During Therapy: WFL for tasks assessed/performed Overall Cognitive Status: Within Functional Limits for tasks assessed       Mobility Bed Mobility Bed Mobility: Supine to Sit Supine to Sit: 4: Min guard Details for Bed Mobility Assistance: increased time and cues for self assist with UEs/technique Transfers Sit to Stand: 4: Max assist Stand to Sit: 4: Mod assist Details for Transfer Assistance: pt very weak and unsteady; cues for hand placement and technique/RW safety; requires increased time to complete transfer           End of Session OT - End of Session Activity Tolerance: Patient tolerated treatment well  GO     Giovoni Bunch, [place] Kung 05/18/2013, 2:42 PM

## 2013-05-18 NOTE — Progress Notes (Signed)
Subjective: Did well overnight, still on clear liquids.  Had 1 BM with blood in it, none since.  He was supposed to have a posttransfusion CBC done at 2200.  The computers were down so his nurse called the lab and he had blood drawn several hours later.  No result was reported.  I called the lab directly and gave them my cell number.  That blood count was never called to me or reported.   A "safety zone portal" was initiated by the nurse.  Fortunately his AM labs show a Hgb of 8.7, still below his goal of 9.    Objective: Vital signs in last 24 hours: Temp:  [97.2 F (36.2 C)-98.5 F (36.9 C)] 98.5 F (36.9 C) (07/19 0554) Pulse Rate:  [65-88] 79 (07/19 0554) Resp:  [16-22] 16 (07/19 0554) BP: (122-150)/(61-87) 122/72 mmHg (07/19 0554) SpO2:  [98 %] 98 % (07/19 0554) Weight change:  Last BM Date: 05/16/13  Intake/Output from previous day: 07/18 0701 - 07/19 0700 In: 886 [P.O.:560; I.V.:40; Blood:286] Out: 4700 [Urine:4700] Intake/Output this shift:   General appearance: alert, no distress and coarse tremor  Neck: Min JVD  Lungs: Min rhonchi bilaterally ow clear  Heart: irregularly irregular rhythm, soft m, Sternotomy  Abdomen: soft, non-tender; bowel sounds normal; no masses, no organomegaly and distended  Extremities: edema now tr with resolving venous stasis. Squeezers on  Pulses: 2+ and symmetric  Neurologic: Mental status: Alert, oriented, thought content appropriate, difficult hearing, coarse upper extremity intention tremor    Lab Results:  Recent Labs  05/17/13 0830 05/18/13 0607  WBC 9.5 10.3  HGB 7.6* 8.7*  HCT 24.1* 27.3*  PLT 233 218   BMET  Recent Labs  05/17/13 0830 05/18/13 0607  NA 142 140  K 4.1 3.6  CL 108 101  CO2 26 27  GLUCOSE 111* 93  BUN 49* 45*  CREATININE 1.77* 1.84*  CALCIUM 9.1 9.2    Studies/Results: X-ray Chest Pa And Lateral   05/17/2013   *RADIOLOGY REPORT*  Clinical Data: Cough, congestion, history smoking, CHF, hypertension,  coronary artery disease, prostate cancer  CHEST - 2 VIEW  Comparison: 04/16/2013  Findings: Enlargement of cardiac silhouette post CABG. Atherosclerotic calcification aorta. Mediastinal contours and pulmonary vascularity normal. Minimal chronic bronchitic changes with left basilar atelectasis. No acute infiltrate, pleural effusion or pneumothorax. Osseous demineralization.  IMPRESSION: Minimal chronic bronchitic changes with left basilar atelectasis. Enlargement of cardiac silhouette post CABG.   Original Report Authenticated By: Ulyses Southward, M.D.    Medications:  I have reviewed the patient's current medications. Scheduled: . acetaminophen  650 mg Oral Once  . ciprofloxacin  250 mg Oral BID  . furosemide  20 mg Intravenous Once  . furosemide  40 mg Oral Daily  . irbesartan  75 mg Oral Daily  . metoprolol  50 mg Oral BID  . pantoprazole  40 mg Oral Q0600  . simvastatin  20 mg Oral q1800    Assessment/Plan: Active Problems:  Lower GI bleed  CKD (chronic kidney disease) stage 3, GFR 30-59 ml/min  Anemia  HTN (hypertension)  Weakness generalized   Lower GI bleed- diverticular versus radiation proctitis - hemodynamically stable - Continue to monitor CBC and transfuse to get Hbg > 9. S/p 4 units of packed red blood cells. Lasix in between. Conservative management preferred.  No Lovenox/Coumadin/ASA for now. Supportive Care. Hbg on D/C 6/19 was 9.1. protonix restarted..  Chronic kidney disease stage 3 -c Cr in June 1.91 - 2.17. Currently stable.  Congestive heart failure with known Systolic/Diastolic Dysfunction. Severe Valvular Dz c Severe Pulm HTN. Venous insufficiency and Stasis Dermatitis also noted. No Pulm Edema on CXR. Dr Allyson Sabal is his cardiologist. Past studies include: ECHO 2008 showed EF 45% c Mod Pulm HTN. Last Myoview was 2010 and Non-Ischemic. Known AFib. ECHO 03/2013 shows - EF 55% to 60%. Diastolic Dysfunction and Severe Pulmonary HTN, moderate Aortic stenosis. Mod MR, LA/RA Severely  Dillitated, TR. Current goal is to diureseas needed to maintain Euvolemia.  PAFib - This seals it and he is not a candidate for anticoagualation despite his CHADS2 Score. Current EKG is AFib c PVC - Rate controlled. ANEMIA due to chronic Dz, Iron Deficiency and acute blood loss from the GIB. GettingTransfusion. After Hbg stable will consider redosing with, Aranesp 80 and Fereheme - follow Hbg.  CORONARY ARTERY DISEASE/s/p CABG - No Angina.  Hypertension-controlled right now we'll hold blood pressure medications for systolic blood pressure less than 120  Prostate cancer-history of radiation, question proctitis with bleed  No DVT prophylaxis with anticoagulation given GI bleed but will provide SCDs if he tolerates better this time  UTI c Leukoctosis  Cipro 250 BID for 5 days. Deconditioning - PT/OT/CW. Pt will get blood over weekend. Start PT/OT and work on transitioning back to SNF for rehab by Monday at [facility]. UTI c Leukoctosis - start Abx = Cipro 250 BID for 5 days.  DO NOT RESUSCITATE CODE STATUS    LOS: 2 days   Samarra Ridgely W 05/18/2013, 8:24 AM

## 2013-05-19 LAB — TYPE AND SCREEN
ABO/RH(D): O NEG
Antibody Screen: NEGATIVE
Unit division: 0

## 2013-05-19 LAB — COMPREHENSIVE METABOLIC PANEL
AST: 12 U/L (ref 0–37)
Albumin: 2.2 g/dL — ABNORMAL LOW (ref 3.5–5.2)
Alkaline Phosphatase: 167 U/L — ABNORMAL HIGH (ref 39–117)
Chloride: 101 mEq/L (ref 96–112)
Creatinine, Ser: 1.88 mg/dL — ABNORMAL HIGH (ref 0.50–1.35)
Potassium: 3.6 mEq/L (ref 3.5–5.1)
Total Bilirubin: 0.7 mg/dL (ref 0.3–1.2)
Total Protein: 7.8 g/dL (ref 6.0–8.3)

## 2013-05-19 LAB — CBC
Platelets: 222 10*3/uL (ref 150–400)
RDW: 18.1 % — ABNORMAL HIGH (ref 11.5–15.5)
WBC: 10 10*3/uL (ref 4.0–10.5)

## 2013-05-19 NOTE — Progress Notes (Signed)
Subjective: Doing well overall.  Still on liquids, but no further blood in BM's, so will change to regular diet.  OK with going back to [facility] to complete rehab.  Objective: Vital signs in last 24 hours: Temp:  [97.4 F (36.3 C)-98.3 F (36.8 C)] 98.3 F (36.8 C) (07/20 0451) Pulse Rate:  [68-99] 80 (07/20 0451) Resp:  [18-22] 22 (07/20 0451) BP: (103-138)/(56-88) 118/61 mmHg (07/20 0451) SpO2:  [94 %-100 %] 94 % (07/20 0451) Weight change:  Last BM Date: 05/18/13  Intake/Output from previous day: 07/19 0701 - 07/20 0700 In: 1222.5 [P.O.:360; I.V.:500; Blood:362.5] Out: 2650 [Urine:2650] Intake/Output this shift:   General appearance: alert, no distress and coarse tremor  Neck: Min JVD  Lungs: Min rhonchi bilaterally ow clear  Heart: irregularly irregular rhythm, soft m, Sternotomy  Abdomen: soft, non-tender; bowel sounds normal; no masses, no organomegaly and distended  Extremities: edema now tr with resolving venous stasis. Squeezers on  Pulses: 2+ and symmetric  Neurologic: Mental status: Alert, oriented, thought content appropriate, difficult hearing, coarse upper extremity intention tremor    Lab Results:  Recent Labs  05/18/13 1928 05/19/13 0515  WBC 10.7* 10.0  HGB 10.7* 10.6*  HCT 33.4* 32.8*  PLT 235 222   BMET  Recent Labs  05/18/13 0607 05/19/13 0515  NA 140 139  K 3.6 3.6  CL 101 101  CO2 27 28  GLUCOSE 93 85  BUN 45* 41*  CREATININE 1.84* 1.88*  CALCIUM 9.2 8.9    Studies/Results: X-ray Chest Pa And Lateral   05/17/2013   *RADIOLOGY REPORT*  Clinical Data: Cough, congestion, history smoking, CHF, hypertension, coronary artery disease, prostate cancer  CHEST - 2 VIEW  Comparison: 04/16/2013  Findings: Enlargement of cardiac silhouette post CABG. Atherosclerotic calcification aorta. Mediastinal contours and pulmonary vascularity normal. Minimal chronic bronchitic changes with left basilar atelectasis. No acute infiltrate, pleural effusion  or pneumothorax. Osseous demineralization.  IMPRESSION: Minimal chronic bronchitic changes with left basilar atelectasis. Enlargement of cardiac silhouette post CABG.   Original Report Authenticated By: Ulyses Southward, M.D.    Medications:  I have reviewed the patient's current medications. Scheduled: . ciprofloxacin  250 mg Oral BID  . furosemide  40 mg Oral Daily  . irbesartan  75 mg Oral Daily  . metoprolol  50 mg Oral BID  . pantoprazole  40 mg Oral Q0600  . simvastatin  20 mg Oral q1800   Continuous: . sodium chloride 50 mL/hr at 05/16/13 2331   ZOX:[id number]  Assessment/Plan: Active Problems:  Lower GI bleed  CKD (chronic kidney disease) stage 3, GFR 30-59 ml/min  Anemia  HTN (hypertension)  Weakness generalized  Lower GI bleed- diverticular versus radiation proctitis - hemodynamically stable - Continue to monitor CBC S/p 5 units of packed red blood cells. Lasix in between. Conservative management preferred. No Lovenox/Coumadin/ASA for now. Supportive Care. Hbg on D/C 6/19 was 9.1. protonix restarted..  Chronic kidney disease stage 3 -c Cr in June 1.91 - 2.17. Currently stable.  Congestive heart failure with known Systolic/Diastolic Dysfunction. Severe Valvular Dz c Severe Pulm HTN. Venous insufficiency and Stasis Dermatitis also noted. No Pulm Edema on CXR. Dr Allyson Sabal is his cardiologist. Past studies include: ECHO 2008 showed EF 45% c Mod Pulm HTN. Last Myoview was 2010 and Non-Ischemic. Known AFib. ECHO 03/2013 shows - EF 55% to 60%. Diastolic Dysfunction and Severe Pulmonary HTN, moderate Aortic stenosis. Mod MR, LA/RA Severely Dillitated, TR. Current goal is to diureseas needed to maintain Euvolemia.  PAFib -  This seals it and he is not a candidate for anticoagualation despite his CHADS2 Score. Current EKG is AFib c PVC - Rate controlled.  ANEMIA due to chronic Dz, Iron Deficiency and acute blood loss from the GIB. GettingTransfusion. After Hbg stable will consider redosing with,  Aranesp 80 and Fereheme - follow Hbg.  CORONARY ARTERY DISEASE/s/p CABG - No Angina.  Hypertension-controlled right now we'll hold blood pressure medications for systolic blood pressure less than 120  Prostate cancer-history of radiation, question proctitis with bleed  No DVT prophylaxis with anticoagulation given GI bleed but will provide SCDs if he tolerates better this time  UTI c Leukoctosis Cipro 250 BID for 5 days.  Deconditioning - PT/OT/CW. Pt will get blood over weekend. Start PT/OT and work on transitioning back to SNF for rehab by Monday at [facility].  UTI c Leukoctosis - start Abx = Cipro 250 BID for 5 days.  Dispo- Return to [employer] [facility]  LOS: 3 days   Asheley Hellberg W 05/19/2013, 9:41 AM

## 2013-05-20 LAB — CBC
HCT: 31.5 % — ABNORMAL LOW (ref 39.0–52.0)
MCH: 28.7 pg (ref 26.0–34.0)
MCV: 89.5 fL (ref 78.0–100.0)
Platelets: 213 10*3/uL (ref 150–400)
RBC: 3.52 MIL/uL — ABNORMAL LOW (ref 4.22–5.81)

## 2013-05-20 NOTE — Progress Notes (Signed)
Patient has converted back to NSR.  Will continue to monitor.

## 2013-05-20 NOTE — Progress Notes (Signed)
CSW was able to speak with Dr. Eloise Harman nurse who stated that MD does not plan to discharge today and plans for discharge tomorrow.  CSW updated pt at bedside and pt daughter via telephone.  CSW updated pt unit RN.   CSW to continue to follow and facilitate pt discharge needs back to [facility].  Jacklynn Lewis, MSW, LCSWA (coverage for [employer]) Clinical Social Work

## 2013-05-20 NOTE — ED Provider Notes (Signed)
Medical screening examination/treatment/procedure(s) were conducted as a shared visit with non-physician practitioner(s) and myself.  I personally evaluated the patient during the encounter Pt c/o rectal bleeding, gen weakness. abd soft nt. Labs. Admit.   Suzi Roots, MD 05/20/13 [phone]

## 2013-05-20 NOTE — Progress Notes (Signed)
Report received from K. O'Berry, RN. No change from initial am assessment. Will continue to monitor and follow the plan of care.  

## 2013-05-20 NOTE — Progress Notes (Signed)
Occupational Therapy Treatment Patient Details Name: [name] [name] MRN: [id number] DOB: 11-13-1920 Today's Date: 05/20/2013 Time: 1324-4010 OT Time Calculation (min): 24 min  OT Assessment / Plan / Recommendation  OT comments  Pt progressing well with mobility and adls.  Pt gets nervous when standing.  Spent a lot of time standing with walker today at sink or at chair just to get him more comfortable with being on his feet again.  Follow Up Recommendations  SNF    Barriers to Discharge       Equipment Recommendations       Recommendations for Other Services    Frequency Min 2X/week   Progress towards OT Goals Progress towards OT goals: Progressing toward goals  Plan Discharge plan remains appropriate    Precautions / Restrictions Precautions Precautions: Fall Restrictions Weight Bearing Restrictions: No   Pertinent Vitals/Pain No reports of pain.    ADL  Eating/Feeding: Performed;Independent Where Assessed - Eating/Feeding: Edge of bed Grooming: Performed;Wash/dry hands;Wash/dry face Where Assessed - Grooming: Supported standing Lower Body Dressing: Performed;Maximal assistance Where Assessed - Lower Body Dressing: Supported sit to Pharmacist, hospital: Performed;Moderate assistance Toilet Transfer Method: Sit to stand;Other (comment) (much safer with walker.) Toilet Transfer Equipment: Bedside commode Toileting - Clothing Manipulation and Hygiene: +1 Total assistance Where Assessed - Toileting Clothing Manipulation and Hygiene: Standing Equipment Used: Rolling walker Transfers/Ambulation Related to ADLs: Pt tranferred to chair with max assist stand pivot w/o walker.  Transfer to [facility] was with a walker and pt required mod assist.  Pt with much more confidence when he has walker in front of him.  Worked on sit to stand transfers x5 with last two attempts requiring min guard. ADL Comments: Pt requires more assist with LE adls due to decreased balance when standing and pt  has difficulty reaching his feet.    OT Diagnosis:    OT Problem List:   OT Treatment Interventions:     OT Goals(current goals can now be found in the care plan section) Acute Rehab OT Goals Patient Stated Goal: i want to walk again OT Goal Formulation: With patient Time For Goal Achievement: 06/01/13 ADL Goals Pt Will Perform Grooming: with min assist;standing Pt Will Transfer to Toilet: with min assist;ambulating Pt Will Perform Toileting - Clothing Manipulation and hygiene: with min assist;sit to/from stand  Visit Information  Last OT Received On: 05/20/13 Assistance Needed: +1 History of Present Illness: Admitted from SNF for 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    Subjective Data      Prior Functioning       Cognition  Cognition Arousal/Alertness: Awake/alert Behavior During Therapy: WFL for tasks assessed/performed Overall Cognitive Status: Within Functional Limits for tasks assessed    Mobility  Bed Mobility Bed Mobility: Supine to Sit Supine to Sit: 4: Min guard Details for Bed Mobility Assistance: increased time and cues for self assist with UEs/technique Transfers Transfers: Sit to Stand;Stand to Sit Sit to Stand: 4: Min guard Stand to Sit: 4: Min assist Details for Transfer Assistance: Pt better today with transfers and is more comfortable transferring with walker although still becomes very shakey and nervous when on his feet.    Exercises      Balance Balance Balance Assessed: Yes Static Standing Balance Static Standing - Balance Support: Bilateral upper extremity supported;During functional activity Static Standing - Level of Assistance: 4: Min assist   End of Session OT - End of Session Activity Tolerance: Patient tolerated treatment well Patient left: in chair;with  call bell/phone within reach Nurse Communication: Mobility status  GO     Hope Budds 05/20/2013, 10:37 AM [phone]

## 2013-05-20 NOTE — Progress Notes (Signed)
Subjective: He feels fine with no abdominal pain, however he indicates that he is still having bloody bowel movements. He has a dry cough without dyspnea.   Objective: Vital signs in last 24 hours: Temp:  [97.5 F (36.4 C)-98.5 F (36.9 C)] 98.5 F (36.9 C) (07/21 0549) Pulse Rate:  [76-104] 81 (07/21 0549) Resp:  [22-26] 22 (07/21 0549) BP: (97-127)/(55-69) 97/61 mmHg (07/21 0549) SpO2:  [96 %-98 %] 96 % (07/21 0549) Weight change:    Intake/Output from previous day: 07/20 0701 - 07/21 0700 In: 280 [P.O.:240; I.V.:40] Out: 1520 [Urine:1520]   General appearance: alert, cooperative and no distress Resp: clear to auscultation bilaterally Cardio: regular rate and rhythm GI: soft, non-tender; bowel sounds normal; no masses,  no organomegaly Extremities: extremities normal, atraumatic, no cyanosis or edema  Lab Results:  Recent Labs  05/19/13 0515 05/20/13 0454  WBC 10.0 11.0*  HGB 10.6* 10.1*  HCT 32.8* 31.5*  PLT 222 213   BMET  Recent Labs  05/18/13 0607 05/19/13 0515  NA 140 139  K 3.6 3.6  CL 101 101  CO2 27 28  GLUCOSE 93 85  BUN 45* 41*  CREATININE 1.84* 1.88*  CALCIUM 9.2 8.9   CMET CMP     Component Value Date/Time   NA 139 05/19/2013 0515   K 3.6 05/19/2013 0515   CL 101 05/19/2013 0515   CO2 28 05/19/2013 0515   GLUCOSE 85 05/19/2013 0515   BUN 41* 05/19/2013 0515   CREATININE 1.88* 05/19/2013 0515   CALCIUM 8.9 05/19/2013 0515   PROT 7.8 05/19/2013 0515   ALBUMIN 2.2* 05/19/2013 0515   AST 12 05/19/2013 0515   ALT 8 05/19/2013 0515   ALKPHOS 167* 05/19/2013 0515   BILITOT 0.7 05/19/2013 0515   GFRNONAA 30* 05/19/2013 0515   GFRAA 35* 05/19/2013 0515    CBG (last 3)  No results found for this basename: GLUCAP,  in the last 72 hours  INR RESULTS:   Lab Results  Component Value Date   INR 1.15 04/16/2013     Studies/Results: No results found.  Medications: I have reviewed the patient's current medications.  Assessment/Plan: #1 GI bleed:  continues and is most likely due to radiation proctitis. Given ongoing rectal bleeding, despite stable Hct, will need to hold on planned discharge today. Will have GI consult for consideration of flex sig to confirm clinical suspicion of radiation proctitis. He may be a candidate for hyperbaric oxygen treatment if he has radiation proctitis. #2 Paroxysmal Atrial fibrillation: stable and he appears to remain in a sinus rhythm.    LOS: 4 days   Melitta Tigue G 05/20/2013, 7:44 AM

## 2013-05-20 NOTE — Progress Notes (Signed)
Physical Therapy Treatment Patient Details Name: [name] [name] MRN: [id number] DOB: 1921-05-21 Today's Date: 05/20/2013 Time: 8469-6295 PT Time Calculation (min): 26 min  PT Assessment / Plan / Recommendation  History of Present Illness Admitted from SNF for 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   Clinical Impression    PT Comments   Pt OOB in recliner talking to daughter on phone wanting to know if he was going back to [facility] today.  Amb pt in hallway twice with one sitting rest break.    Follow Up Recommendations  SNF     Does the patient have the potential to tolerate intense rehabilitation     Barriers to Discharge        Equipment Recommendations       Recommendations for Other Services    Frequency Min 3X/week   Progress towards PT Goals Progress towards PT goals: Progressing toward goals  Plan      Precautions / Restrictions Precautions Precautions: Fall    Pertinent Vitals/Pain No c/o pain    Mobility  Bed Mobility Bed Mobility: Not assessed Details for Bed Mobility Assistance: Pt OOB in recliner Transfers Transfers: Sit to Stand;Stand to Sit Sit to Stand: 4: Min assist Stand to Sit: 4: Min assist Details for Transfer Assistance: increased time. tremors throughout. Ambulation/Gait Ambulation/Gait Assistance: 4: Min assist;3: Mod assist Ambulation Distance (Feet): 100 Feet (50' x 2 one sitting rest break) Assistive device: Rolling walker Ambulation/Gait Assistance Details: Very shaky unsteady gait.  Chair following closely behind.  Gait Pattern: Step-through pattern;Decreased stride length;Trunk flexed;Narrow base of support Gait velocity: limited activity tolerance     PT Goals (current goals can now be found in the care plan section)    Visit Information  Last PT Received On: 05/20/13 Assistance Needed: +2 History of Present Illness: Admitted from SNF for 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     Subjective Data      Cognition       Balance     End of Session PT - End of Session Equipment Utilized During Treatment: Gait belt Activity Tolerance: Patient limited by fatigue Patient left: in chair;with call bell/phone within reach;with nursing/sitter in room   Felecia Shelling  PTA [facility]  Acute  Rehab Pager      [phone]

## 2013-05-20 NOTE — Progress Notes (Signed)
Have attempted to contact Dr. Silvano Rusk several times today.  Did speak with the nurse earlier today to clarify that Mr. [name] [name] had not had any bloody BMs since Friday night.  Nurse stated that Dr. Eloise Harman would be discharging the patient. Myself and the social worker both have attempted to contact Dr. Eloise Harman for paperwork and discharge orders.  Have left messages at his office.  Have informed patient of discharge status and will continue to monitor.

## 2013-05-21 LAB — CBC
HCT: 32.5 % — ABNORMAL LOW (ref 39.0–52.0)
MCH: 28.2 pg (ref 26.0–34.0)
MCHC: 31.1 g/dL (ref 30.0–36.0)
RDW: 18 % — ABNORMAL HIGH (ref 11.5–15.5)

## 2013-05-21 MED ORDER — CIPROFLOXACIN HCL 250 MG PO TABS: 250.0000 mg | ORAL_TABLET | Freq: Two times a day (BID) | ORAL | Status: AC

## 2013-05-21 NOTE — Progress Notes (Addendum)
Occupational Therapy Treatment Patient Details Name: [name] [name] MRN: [id number] DOB: 1921/02/28 Today's Date: 05/21/2013 Time:  -     OT Assessment / Plan / Recommendation  History of present illness Admitted from SNF for 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   Clinical Impression Pt making progress and planning to return to SNF to continue with therapy after acute care d/c. Pt more confident during functional  Mobility during ADLs but continues to demo decreased balance/safety   OT comments  Pt pleasant and cooperative  Follow Up Recommendations  SNF    Barriers to Discharge   None    Equipment Recommendations  None recommended by OT  TBD  Recommendations for Other Services    Frequency Min 2X/week   Progress towards OT Goals Progress towards OT goals: Progressing toward goals  Plan Discharge plan remains appropriate    Precautions / Restrictions Precautions Precautions: Fall Restrictions Weight Bearing Restrictions: No   Pertinent Vitals/Pain No c/o pain    ADL  Grooming: Performed;Wash/dry hands;Wash/dry face;Min guard;Minimal assistance Where Assessed - Grooming: Supported Copywriter, advertising: Performed;Minimal assistance;Moderate assistance Toilet Transfer Method: Sit to Barista: Bedside commode Toileting - Clothing Manipulation and Hygiene: Maximal assistance Where Assessed - Engineer, mining and Hygiene: Standing Equipment Used: Rolling walker;Gait belt (BSC) ADL Comments: pt required max A with grooming and clothing mgt due to decreased balance when standing    OT Diagnosis:    OT Problem List:   OT Treatment Interventions:     OT Goals(current goals can now be found in the care plan section)    Visit Information  Last OT Received On: 05/21/13 Assistance Needed: +1 History of Present Illness: Admitted from SNF for 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     Subjective Data      Prior Functioning       Cognition  Cognition Arousal/Alertness: Awake/alert Behavior During Therapy: WFL for tasks assessed/performed Overall Cognitive Status: Within Functional Limits for tasks assessed    Mobility  Bed Mobility Bed Mobility: Not assessed Transfers Transfers: Sit to Stand;Stand to Sit Sit to Stand: 4: Min assist Stand to Sit: 4: Min assist Details for Transfer Assistance: required increased time    Exercises      Balance Balance Balance Assessed: Yes Dynamic Standing Balance Dynamic Standing - Balance Support: Left upper extremity supported;During functional activity Dynamic Standing - Level of Assistance: 4: Min assist   End of Session OT - End of Session Equipment Utilized During Treatment: Gait belt;Rolling walker;Other (comment) (BSC) Activity Tolerance: Treatment limited secondary to agitation Patient left: in chair;with call bell/phone within reach  GO     Galen Manila 05/21/2013, 1:18 PM

## 2013-05-21 NOTE — Progress Notes (Signed)
Dr Eloise Harman paged and made aware of foley still being in, instructed to leave it in for d/c to facility

## 2013-05-21 NOTE — Discharge Summary (Signed)
Physician Discharge Summary  Patient ID: [name] [name] MRN: [id number] DOB/AGE: [birth year] 79 y.o.  Admit date: 05/16/2013 Discharge date: 05/21/2013   Discharge Diagnoses:  Principal Problem:   Lower GI bleed Active Problems:   Anemia   Weakness generalized   Atrial fibrillation   CKD (chronic kidney disease) stage 3, GFR 30-59 ml/min   HTN (hypertension)   Discharged Condition: good  Hospital Course:   The patient is an 79 year old male who is a resident a skilled nursing facility, with a history of CAD status post CABG, chronic A. fib, CHF, chronic kidney disease, prostate cancer status post radiation with history of radiation proctitis who was recently admitted for lower GI bleed approximately one month ago and did receive 2 units of packed red blood cells accompanied by some evidence of volume overload requiring diuresis. Bleeding resolved spontaneously, and the patient has remained off of anticoagulants, but was continued on aspirin while at the skilled nursing facility. He has remained hemodynamically stable however has had significant issues with blood in his urinary catheter, and was evaluated approximately 2 weeks go by [facility], indwelling Foley catheter replaced, but continued to have clots intermittently but mostly bloody discharge, that cleared 2 days ago per her daughter's report. However clots reappeared earlier today, catheter has been replaced since my evaluation in the emergency room. Currently draining clear urine. In emergency room, agents baseline hemoglobin noted to be in the age range, however was found to be 5.5 after significant clots found in the patient's diaper. We were called to admit the patient for further evaluation. The patient maintains a DO NOT RESUSCITATE CODE STATUS, and 2 daughters present in the ER confirmed the same along with the patient.  Further exam indicated that he had gross rectal bleeding of a moderate amount. He was transfused a total of  5 units of packed red blood cells and aspirin was discontinued. With this the rectal bleeding ceased and he has remained hemodynamically stable. At this time we have not opted to request a GI physician consultation. The plan is to continue him off of Coumadin or aspirin for stroke prophylaxis for now. Consideration will be given for restarting stroke prevention in the future, or continuing a no aspirin and no Coumadin plan given what appeared to be radiation proctitis. If he does have recurrent rectal bleeding and radiation proctitis is confirmed then he may be a candidate for hyperbaric oxygen treatment for this. He is to return to a skilled nursing facility to complete the planned rehabilitation for physical deconditioning. On the day of discharge he felt fine with no dyspnea, no abdominal pain, no rectal bleeding, and a good appetite.  Consults: None  Significant Diagnostic Studies:  No results found.  Labs: Lab Results  Component Value Date   WBC 9.8 05/21/2013   HGB 10.1* 05/21/2013   HCT 32.5* 05/21/2013   MCV 90.8 05/21/2013   PLT 216 05/21/2013     Recent Labs Lab 05/19/13 0515  NA 139  K 3.6  CL 101  CO2 28  BUN 41*  CREATININE 1.88*  CALCIUM 8.9  PROT 7.8  BILITOT 0.7  ALKPHOS 167*  ALT 8  AST 12  GLUCOSE 85       Lab Results  Component Value Date   INR 1.15 04/16/2013     No results found for this or any previous visit (from the past 240 hour(s)).    Discharge Exam: Blood pressure 107/58, pulse 88, temperature 98.4 F (36.9 C), temperature source Oral,  resp. rate 16, height 5\' 6"  (1.676 m), weight 77.6 kg (171 lb 1.2 oz), SpO2 96.00%.  Physical Exam: In general, he is a frail elderly white man who was in no apparent distress while sitting upright in bed. HEENT exam was within normal limits, neck was supple without jugular venous distention or carotid bruit, chest was clear to auscultation, heart had a regular rate and rhythm without significant murmur or gallop,  abdomen had normal bowel sounds no tenderness, he had bilateral trace leg edema. He was alert and answered questions appropriately he could move all extremities somewhat.  Disposition:  He'll be discharged from the hospital and transferred to the skilled nursing facility for rehabilitation. Following completion of skilled nursing facility rehabilitation he should have a followup visit with his primary care physician, Dr. Creola Corn.  Discharge Orders   Future Orders Complete By Expires     Call MD for:  As directed     Comments:      Call for recurrent rectal bleeding, abdominal pain, difficulty breathing, 5 pound weight gain within a week, or other concerning symptoms.    Diet - low sodium heart healthy  As directed     Discharge instructions  As directed     Comments:      You will be discharged back to your skilled nursing facility for rehabilitation. All blood thinners to prevent a stroke have been stopped due to rectal bleeding, and could be restarted in the future at the discretion of the attending physician at the skilled nursing facility.    Increase activity slowly  As directed         Medication List    STOP taking these medications       aspirin 81 MG tablet      TAKE these medications       acetaminophen 325 MG tablet  Commonly known as:  TYLENOL  Take 2 tablets (650 mg total) by mouth every 6 (six) hours as needed.     calcium carbonate 500 MG chewable tablet  Commonly known as:  TUMS - dosed in mg elemental calcium  Chew 1 tablet by mouth as needed.     ciprofloxacin 250 MG tablet  Commonly known as:  CIPRO  Take 1 tablet (250 mg total) by mouth 2 (two) times daily.     furosemide 40 MG tablet  Commonly known as:  LASIX  Take 40 mg by mouth daily.     lovastatin 40 MG tablet  Commonly known as:  MEVACOR  Take 40 mg by mouth at bedtime.     metoprolol 50 MG tablet  Commonly known as:  LOPRESSOR  Take 50 mg by mouth 2 (two) times daily.     valsartan 80  MG tablet  Commonly known as:  DIOVAN  Take 80 mg by mouth daily.         Signed: Garlan Fillers 05/21/2013, 9:21 AM

## 2013-05-21 NOTE — Progress Notes (Addendum)
Patient cleared for discharge. Packet copied and placed in [place]. CSW met with patient. Patient is alert and oriented X3. Patient is glad to be discharged and happy to go back to [facility]. CSW called patient's daughter, Eber Jones, she is very happy that patient is medically stable and returning to [facility]. ptar called for transport.  Michaell Grider C. Tron Flythe MSW, LCSW [phone]

## 2013-05-22 ENCOUNTER — Non-Acute Institutional Stay (SKILLED_NURSING_FACILITY): Payer: PRIVATE HEALTH INSURANCE | Admitting: Adult Health

## 2013-05-22 ENCOUNTER — Encounter: Payer: Self-pay | Admitting: Adult Health

## 2013-05-22 DIAGNOSIS — E785 Hyperlipidemia, unspecified: Secondary | ICD-10-CM

## 2013-05-22 DIAGNOSIS — I10 Essential (primary) hypertension: Secondary | ICD-10-CM

## 2013-05-22 DIAGNOSIS — D649 Anemia, unspecified: Secondary | ICD-10-CM

## 2013-05-22 DIAGNOSIS — I5041 Acute combined systolic (congestive) and diastolic (congestive) heart failure: Secondary | ICD-10-CM

## 2013-05-22 DIAGNOSIS — I509 Heart failure, unspecified: Secondary | ICD-10-CM

## 2013-05-22 DIAGNOSIS — K922 Gastrointestinal hemorrhage, unspecified: Secondary | ICD-10-CM

## 2013-05-22 DIAGNOSIS — Z8546 Personal history of malignant neoplasm of prostate: Secondary | ICD-10-CM

## 2013-05-22 DIAGNOSIS — N390 Urinary tract infection, site not specified: Secondary | ICD-10-CM

## 2013-05-22 DIAGNOSIS — I4891 Unspecified atrial fibrillation: Secondary | ICD-10-CM

## 2013-05-22 NOTE — Assessment & Plan Note (Signed)
He is presently stable will continue lasix 40 mg daily and will monitor

## 2013-05-22 NOTE — Assessment & Plan Note (Signed)
He is presently stable will continue cozaar 50 mg daily and will monitor his status

## 2013-05-22 NOTE — Assessment & Plan Note (Signed)
Will continue his cipro through 05-24-13; and will continue florastor for 2 weeks as a probiotic; will continue to monitor his status

## 2013-05-22 NOTE — Assessment & Plan Note (Signed)
Will continue lipitor 10 mg daily and will monitor  

## 2013-05-22 NOTE — Assessment & Plan Note (Addendum)
He is status post cryoablation of prostate with long term foley is followed by urology. Will continue to monitor his status

## 2013-05-22 NOTE — Assessment & Plan Note (Signed)
He has had no further bleeding; will continue to monitor his status

## 2013-05-22 NOTE — Assessment & Plan Note (Signed)
He was transfused in the hospital; will repeat cbc in order to monitor his blood count

## 2013-05-22 NOTE — Assessment & Plan Note (Signed)
At this time he is not appropriate for anticoagulation; this will be addressed in the future; at this time his heart rate is regular and stable

## 2013-05-22 NOTE — Progress Notes (Signed)
Patient ID: [name] [name], male   DOB: [birth year], 79 y.o.   MRN: [id number]  [facility]  Allergies  Allergen Reactions  . Ancef (Cefazolin) Anaphylaxis  . Ace Inhibitors Cough  . Penicillins Cross Reactors     Anaphylaxix/don't give anything in the "family" of penicillins  . Sulfa Antibiotics     Don't remember. Didn't want to give me anymore     Chief Complaint  Patient presents with  . Hospitalization Follow-up    HPI: He has been hospitalized for a lower gi bleed required transfusions. He has generalized weakness present; and chronic renal disease. He is here for short term rehab at this time. He tells me his goal is for him to return back to his home.   Past Medical History  Diagnosis Date  . Hypertension   . History of prostate cancer 1996    treated with radiation  . Arthritis   . Gout, arthritis 2010    bil feet  . Cancer     PORSTATE-HX OF RADIATION AND SURGERY  . Diverticulosis of colon   . CAD (coronary artery disease)     cabg 1996; echo 03/15/2007-EF 45-50%, LA mod to severe dilated, mod TR, mod pulmonary HTN, mod sclerotic aortic valve; myoview6/23/2010- no ischemia  . A-fib     chronic, no longer on coumadin due to rectal bleeding  . Hyperlipidemia   . Rectal bleed     Past Surgical History  Procedure Laterality Date  . Coronary artery bypass graft  04/1995    LIMA to LAD, vein graft to diag branch, vein to 3 sequential marginal branches, and vein to PDA  . Appendectomy  1950s  . Eye surgery  2009    cataract surgery  . Cryoablation of prostate  06/23/2005  . Colonoscopy  11/03/2000  . Cardiac catheterization  05/04/2005    patent grafts  . Cardiac catheterization  04/10/1995    presyncopal episode on way to OR, 3V disease with left main involvement, nl LV, CVTS was contacted    VITAL SIGNS BP 120/62  Pulse 68  Ht 5\' 6"  (1.676 m)  Wt 185 lb (83.915 kg)  BMI 29.87 kg/m2   Patient's Medications  New Prescriptions   No medications on file   Previous Medications   ACETAMINOPHEN (TYLENOL) 325 MG TABLET    Take 2 tablets (650 mg total) by mouth every 6 (six) hours as needed.   ATORVASTATIN (LIPITOR) 10 MG TABLET    Take 10 mg by mouth daily.   CALCIUM CARBONATE (TUMS - DOSED IN MG ELEMENTAL CALCIUM) 500 MG CHEWABLE TABLET    Chew 1 tablet by mouth as needed.   CIPROFLOXACIN (CIPRO) 250 MG TABLET    Take 1 tablet (250 mg total) by mouth 2 (two) times daily.   FUROSEMIDE (LASIX) 40 MG TABLET    Take 40 mg by mouth daily.   LOSARTAN (COZAAR) 100 MG TABLET    Take 50 mg by mouth daily.   METOPROLOL (LOPRESSOR) 50 MG TABLET    Take 50 mg by mouth 2 (two) times daily.     SACCHAROMYCES BOULARDII (FLORASTOR) 250 MG CAPSULE    Take 250 mg by mouth 2 (two) times daily.  Modified Medications   No medications on file  Discontinued Medications   LOVASTATIN (MEVACOR) 40 MG TABLET    Take 40 mg by mouth at bedtime.     VALSARTAN (DIOVAN) 80 MG TABLET    Take 80 mg by mouth daily.  SIGNIFICANT DIAGNOSTIC EXAMS   04-16-13: 2-d echo:Left ventricle: The cavity size was normal. Wall thickness was normal. Systolic function was normal. The estimated ejection fraction was in the range of 55% to 60%. Although no diagnostic regional wall motion abnormality was identified, this possibility cannot be completely excluded on the basis of this study. Doppler parameters are consistent with elevated mean left atrial fillingpressure.  05-17-13: chest x-ray:Minimal chronic bronchitic changes with left basilar atelectasis.  Enlargement of cardiac silhouette post CABG.     LABS REVIEWED  04-16-13: bnp: 19449.0 04-22-13: hgb a1c 5.8 04-26-13: wbc 87; hgb 8.3; hct 27.4; mcv 91.0; plt 226; glucose 89; bun 64; creat 1.8; k+3.9;na++143 05-02-13: hgb 8.8; hct 29.8 05-17-13: wbc 9.5; hgb 7.6; hct 24.1; mcv 89.9; plt 233; glucose 111; bun 49; creat 1.77; k+4.1na++142 Alk phos 189; albumin 2.1 05-18-13; wbc 10.3; hgb 8.7; hct 27.3; mcv 90.4; plt218; glucose 93; bun 45;  creat 1.84; k+3.6na++140 Alk phos 167; albumin 2.2 05-21-13: wbc 9.8; hgb 10.1; hct 32.5; mcv 90.8 plt 216    Review of Systems  Constitutional: Negative for malaise/fatigue.  Respiratory: Negative for cough and shortness of breath.   Cardiovascular: Negative for chest pain, palpitations and leg swelling.  Gastrointestinal: Negative for heartburn, abdominal pain and constipation.       No further bleeding present   Genitourinary:       Has long term foley; no further blood in urine  Musculoskeletal: Negative for myalgias and joint pain.  Skin: Negative.   Neurological: Negative for dizziness and headaches.  Psychiatric/Behavioral: Negative for depression. The patient does not have insomnia.    Physical Exam  Constitutional:  thin  Neck: Normal range of motion. Neck supple. No JVD present. No thyromegaly present.  Cardiovascular: Normal rate, regular rhythm and intact distal pulses.   Respiratory: Effort normal and breath sounds normal. No respiratory distress.  GI: Soft. Bowel sounds are normal. He exhibits no distension and no mass. There is no tenderness.  Genitourinary:  Has foley  Musculoskeletal: Normal range of motion. He exhibits no edema.  Using wheelchair  Neurological: He is alert.  Skin: Skin is warm and dry.  Psychiatric: He has a normal mood and affect.       ASSESSMENT/ PLAN:  HTN (hypertension) He is presently stable will continue cozaar 50 mg daily and will monitor his status  Acute combined systolic and diastolic congestive heart failure He is presently stable will continue lasix 40 mg daily and will monitor  Atrial fibrillation At this time he is not appropriate for anticoagulation; this will be addressed in the future; at this time his heart rate is regular and stable  Lower GI bleed He has had no further bleeding; will continue to monitor his status   Urinary tract infection, site not specified Will continue his cipro through 05-24-13; and will  continue florastor for 2 weeks as a probiotic; will continue to monitor his status   History of prostate cancer He is status post cryoablation of prostate with long term foley is followed by urology. Will continue to monitor his status   Anemia He was transfused in the hospital; will repeat cbc in order to monitor his blood count   Dyslipidemia Will continue lipitor 10 mg daily and will monitor      Time spent with patient 50 minutes

## 2013-05-27 ENCOUNTER — Non-Acute Institutional Stay (SKILLED_NURSING_FACILITY): Payer: PRIVATE HEALTH INSURANCE | Admitting: Internal Medicine

## 2013-05-27 DIAGNOSIS — I5041 Acute combined systolic (congestive) and diastolic (congestive) heart failure: Secondary | ICD-10-CM

## 2013-05-27 DIAGNOSIS — I509 Heart failure, unspecified: Secondary | ICD-10-CM

## 2013-05-27 DIAGNOSIS — R5381 Other malaise: Secondary | ICD-10-CM

## 2013-05-27 DIAGNOSIS — M109 Gout, unspecified: Secondary | ICD-10-CM

## 2013-05-27 DIAGNOSIS — N390 Urinary tract infection, site not specified: Secondary | ICD-10-CM

## 2013-05-27 DIAGNOSIS — J438 Other emphysema: Secondary | ICD-10-CM

## 2013-05-27 DIAGNOSIS — I4891 Unspecified atrial fibrillation: Secondary | ICD-10-CM

## 2013-05-27 DIAGNOSIS — I10 Essential (primary) hypertension: Secondary | ICD-10-CM

## 2013-05-27 DIAGNOSIS — K922 Gastrointestinal hemorrhage, unspecified: Secondary | ICD-10-CM

## 2013-05-27 DIAGNOSIS — Z8546 Personal history of malignant neoplasm of prostate: Secondary | ICD-10-CM

## 2013-05-27 DIAGNOSIS — R531 Weakness: Secondary | ICD-10-CM

## 2013-05-27 NOTE — Progress Notes (Signed)
Patient ID: [name] [name], male   DOB: 02/04/1921, 79 y.o.   MRN: [id number]  [facility]  Code Status: DNR  Allergies  Allergen Reactions  . Ancef (Cefazolin) Anaphylaxis  . Ace Inhibitors Cough  . Penicillins Cross Reactors     Anaphylaxix/don't give anything in the "family" of penicillins  . Sulfa Antibiotics     Don't remember. Didn't want to give me anymore    Chief Complaint  Patient presents with  . Hospitalization Follow-up    HPI:  79 y/o male patient with hx of cad s/p CABG, chronic afib, ckd, prostate cancer, chf and recent gi bleed requiring transfusion here for STR was admitted to hospital with hematuria and drop in hb to 5.5. He was found to have rectal bleed and was transfused a total of 5 u prbc. Aspirin was discontinued. He has been sent back to SNF. No further rectal bleed reported. He has been seen by urology today and his foley catheter has been removed. He is also on cirpofloxacin for uti.  Review of Systems:  Left knee hurting him for 2 days Constitutional: Negative for fever, chills and diaphoresis.  HENT: Negative for congestion.   Eyes: Negative for blurred vision.  Respiratory: Positive for cough, using incentive spirometer Cardiovascular: Negative for chest pain and palpitations.  Gastrointestinal: Negative for heartburn, nausea, vomiting, abdominal pain and constipation.  Genitourinary: Negative for dysuria.        Had a foley catheter removed today and is on voiding trial. Saw urology today Musculoskeletal: Negative for falls.  Skin: Negative for rash.  Neurological: Positive for weakness. Negative for dizziness, loss of consciousness and headaches.  Psychiatric/Behavioral: Negative for depression.    Past Medical History  Diagnosis Date  . Hypertension   . History of prostate cancer 1996    treated with radiation  . Arthritis   . Gout, arthritis 2010    bil feet  . Cancer     PORSTATE-HX OF RADIATION AND SURGERY  . Diverticulosis of colon    . CAD (coronary artery disease)     cabg 1996; echo 03/15/2007-EF 45-50%, LA mod to severe dilated, mod TR, mod pulmonary HTN, mod sclerotic aortic valve; myoview6/23/2010- no ischemia  . A-fib     chronic, no longer on coumadin due to rectal bleeding  . Hyperlipidemia   . Rectal bleed    Past Surgical History  Procedure Laterality Date  . Coronary artery bypass graft  04/1995    LIMA to LAD, vein graft to diag branch, vein to 3 sequential marginal branches, and vein to PDA  . Appendectomy  1950s  . Eye surgery  2009    cataract surgery  . Cryoablation of prostate  06/23/2005  . Colonoscopy  11/03/2000  . Cardiac catheterization  05/04/2005    patent grafts  . Cardiac catheterization  04/10/1995    presyncopal episode on way to OR, 3V disease with left main involvement, nl LV, CVTS was contacted   Social History:   reports that he quit smoking about 28 years ago. He has never used smokeless tobacco. He reports that he does not drink alcohol or use illicit drugs.  Family History  Problem Relation Age of Onset  . Heart Problems Mother   . Heart Problems Father   . Cancer Brother   . Cancer Sister     Medications: Patient's Medications  New Prescriptions   No medications on file  Previous Medications   ACETAMINOPHEN (TYLENOL) 325 MG TABLET    Take  2 tablets (650 mg total) by mouth every 6 (six) hours as needed.   ATORVASTATIN (LIPITOR) 10 MG TABLET    Take 10 mg by mouth daily.   CALCIUM CARBONATE (TUMS - DOSED IN MG ELEMENTAL CALCIUM) 500 MG CHEWABLE TABLET    Chew 1 tablet by mouth as needed.   FUROSEMIDE (LASIX) 40 MG TABLET    Take 40 mg by mouth daily.   LOSARTAN (COZAAR) 100 MG TABLET    Take 50 mg by mouth daily.   METOPROLOL (LOPRESSOR) 50 MG TABLET    Take 50 mg by mouth 2 (two) times daily.     SACCHAROMYCES BOULARDII (FLORASTOR) 250 MG CAPSULE    Take 250 mg by mouth 2 (two) times daily.  Modified Medications   No medications on file  Discontinued Medications   No  medications on file     Physical Exam: Filed Vitals:   05/27/13 1504  BP: 118/60  Pulse: 80  Temp: 97.8 F (36.6 C)  Resp: 18  SpO2: 96%   General appearance: alert, no distress, frail HEENT: pallor present, no icterus, no lymphadenopathy, no JVD, mmm Lungs: CTAB, bibasilar rhonchi, no wheeze or crackles Heart: irregularly irregular rhythm, soft murmur, Sternotomy scar present Abdomen: soft, non-tender; bowel sounds normal; no masses, no organomegaly Extremities: edema bilaterally with resolving venous stasis, right leg wrapped in kerlix, distal pulses 2+ and symmetric, generalized weakness, left knee erythema and tenderness present Neurologic:  Alert, oriented, thought content appropriate, coarse upper extremity intention tremor   Labs reviewed: Basic Metabolic Panel:  Recent Labs  16/10/96 0830 05/18/13 0607 05/19/13 0515  NA 142 140 139  K 4.1 3.6 3.6  CL 108 101 101  CO2 26 27 28   GLUCOSE 111* 93 85  BUN 49* 45* 41*  CREATININE 1.77* 1.84* 1.88*  CALCIUM 9.1 9.2 8.9   Liver Function Tests:  Recent Labs  05/17/13 0830 05/18/13 0607 05/19/13 0515  AST 13 12 12   ALT 12 10 8   ALKPHOS 189* 174* 167*  BILITOT 0.4 0.6 0.7  PROT 8.0 7.9 7.8  ALBUMIN 2.1* 2.2* 2.2*   CBC:  Recent Labs  04/16/13 0854  05/16/13 1800  05/18/13 0047  05/19/13 0515 05/20/13 0454 05/21/13 0520  WBC 8.8  < > 14.6*  < > 12.4*  < > 10.0 11.0* 9.8  NEUTROABS 6.7  --  12.3*  --  10.1*  --   --   --   --   HGB 8.0*  < > 5.5*  < > 8.6*  < > 10.6* 10.1* 10.1*  HCT 25.3*  < > 17.5*  < > 26.4*  < > 32.8* 31.5* 32.5*  MCV 85.2  < > 90.2  < > 89.5  < > 88.4 89.5 90.8  PLT 208  < > 273  < > 222  < > 222 213 216  < > = values in this interval not displayed. Cardiac Enzymes:  Recent Labs  04/16/13 0854  TROPONINI <0.30   04-16-13: 2-d echo:Left ventricle: The cavity size was normal. Wall thickness was normal. Systolic function was normal. The estimated ejection fraction was in the  range of 55% to 60%. Although no diagnostic regional wall motion abnormality was identified, this possibility cannot be completely excluded on the basis of this study. Doppler parameters are consistent with elevated mean left atrial fillingpressure.  05-17-13: chest x-ray:Minimal chronic bronchitic changes with left basilar atelectasis.   Enlargement of cardiac silhouette post CABG.     Assessment/Plan  Chronic  emphysema Add duonebs q6h for a week and need to consider long term bronchodilator treatment for possible chronic bronchitis/ emphysema given his hx of smoking in past. Encouraged to use incentive spirometer  Left knee pain  with redness and tenderness, provides hx of gout in past. Currently not on any uric acid lowering agent. Recent hx of gi bleed. So will avoid NSAIDs. Will have him on prednisone 40 mg daily for 3 days and reassess. Check uric acid level in 1 week  Anemia today's hb 7.8. His hb was upto 10 post transfusion. Will recheck h/h on 05/29/13. Monitor vitals  uti  completed ciprofloxacin , seen by urology today. Is on voiding trial at present. Encouraged hydration  Lower gi bleed most likely diverticular bleed. No further bleeding episodes. With drop in hb/hct, recheck cbc  Generalized weakness in setting of recent bleed, fluid overload and medical comorbidities. Will have him work with therpay team. Fall precautions.  HTN (hypertension) presently stable will continue cozaar 50 mg daily and will monitor his status  combined systolic and diastolic congestive heart failure  presently stable will continue lopressor and lasix 40 mg daily and will monitor  Atrial fibrillation Continue lopressor, off anticoagulation/ antiplatelet agent due to recent bleed  History of prostate cancer He is status post cryoablation of prostate with long term foley is followed by urology. Will continue to monitor his status   Family/ staff Communication: reviewed care plan with patient  and nursing supervisor  Goals of care: return home after completion of STR  Labs/tests ordered: cbc, cmp

## 2013-05-30 ENCOUNTER — Non-Acute Institutional Stay (SKILLED_NURSING_FACILITY): Payer: PRIVATE HEALTH INSURANCE | Admitting: Adult Health

## 2013-05-30 ENCOUNTER — Encounter: Payer: Self-pay | Admitting: Adult Health

## 2013-05-30 DIAGNOSIS — D649 Anemia, unspecified: Secondary | ICD-10-CM

## 2013-05-30 DIAGNOSIS — M109 Gout, unspecified: Secondary | ICD-10-CM

## 2013-05-30 MED ORDER — COLCHICINE 0.6 MG PO TABS: 0.6000 mg | ORAL_TABLET | Freq: Every day | ORAL | Status: AC

## 2013-05-30 MED ORDER — ALLOPURINOL 100 MG PO TABS: 100.0000 mg | ORAL_TABLET | Freq: Every day | ORAL | Status: DC

## 2013-05-30 NOTE — Assessment & Plan Note (Signed)
His hgb is stable at 9.4; will not make changes and will monitor his status .

## 2013-05-30 NOTE — Assessment & Plan Note (Signed)
His uric acid level is elevated at 10.3 he has completed a prednisone dose of 40 mg for three days with some relief of his knee pain; he has more pain in his left than right knee. Will begin colchicine 0.6 mg daily for one week; will then begin allopurinol 100 mg daily to prevent further outbreaks and will monitor his status

## 2013-05-30 NOTE — Progress Notes (Signed)
Patient ID: [name] [name], male   DOB: August 19, 1921, 79 y.o.   MRN: [id number]  [facility]  Allergies  Allergen Reactions  . Ancef (Cefazolin) Anaphylaxis  . Ace Inhibitors Cough  . Penicillins Cross Reactors     Anaphylaxix/don't give anything in the "family" of penicillins  . Sulfa Antibiotics     Don't remember. Didn't want to give me anymore     Chief Complaint  Patient presents with  . Acute Visit    gout    HPI: I have been asked to seen regarding his knee pain; dr. Glade Lloyd has seen him and ordered prednisone for several days; which she has completed. His uric acid level is elevated at 10.3. He states his right knee hurts more than his left; but the prednisone did help some.   Past Medical History  Diagnosis Date  . Hypertension   . History of prostate cancer 1996    treated with radiation  . Arthritis   . Gout, arthritis 2010    bil feet  . Cancer     PORSTATE-HX OF RADIATION AND SURGERY  . Diverticulosis of colon   . CAD (coronary artery disease)     cabg 1996; echo 03/15/2007-EF 45-50%, LA mod to severe dilated, mod TR, mod pulmonary HTN, mod sclerotic aortic valve; myoview6/23/2010- no ischemia  . A-fib     chronic, no longer on coumadin due to rectal bleeding  . Hyperlipidemia   . Rectal bleed     Past Surgical History  Procedure Laterality Date  . Coronary artery bypass graft  04/1995    LIMA to LAD, vein graft to diag branch, vein to 3 sequential marginal branches, and vein to PDA  . Appendectomy  1950s  . Eye surgery  2009    cataract surgery  . Cryoablation of prostate  06/23/2005  . Colonoscopy  11/03/2000  . Cardiac catheterization  05/04/2005    patent grafts  . Cardiac catheterization  04/10/1995    presyncopal episode on way to OR, 3V disease with left main involvement, nl LV, CVTS was contacted    VITAL SIGNS BP 120/60  Pulse 68  Ht 5\' 6"  (1.676 m)  Wt 185 lb (83.915 kg)  BMI 29.87 kg/m2   Patient's Medications  New Prescriptions   No  medications on file  Previous Medications   ACETAMINOPHEN (TYLENOL) 325 MG TABLET    Take 2 tablets (650 mg total) by mouth every 6 (six) hours as needed.   ATORVASTATIN (LIPITOR) 10 MG TABLET    Take 10 mg by mouth daily.   CALCIUM CARBONATE (TUMS - DOSED IN MG ELEMENTAL CALCIUM) 500 MG CHEWABLE TABLET    Chew 1 tablet by mouth as needed.   FUROSEMIDE (LASIX) 40 MG TABLET    Take 40 mg by mouth daily.   LOSARTAN (COZAAR) 100 MG TABLET    Take 50 mg by mouth daily.   METOPROLOL (LOPRESSOR) 50 MG TABLET    Take 50 mg by mouth 2 (two) times daily.     SACCHAROMYCES BOULARDII (FLORASTOR) 250 MG CAPSULE    Take 250 mg by mouth 2 (two) times daily.  Modified Medications   No medications on file  Discontinued Medications   No medications on file    SIGNIFICANT DIAGNOSTIC EXAMS   04-16-13: 2-d echo:Left ventricle: The cavity size was normal. Wall thickness was normal. Systolic function was normal. The estimated ejection fraction was in the range of 55% to 60%. Although no diagnostic regional wall motion abnormality was  identified, this possibility cannot be completely excluded on the basis of this study. Doppler parameters are consistent with elevated mean left atrial fillingpressure.  05-17-13: chest x-ray:Minimal chronic bronchitic changes with left basilar atelectasis.  Enlargement of cardiac silhouette post CABG.     LABS REVIEWED  04-16-13: bnp: 19449.0 04-22-13: hgb a1c 5.8 04-26-13: wbc 87; hgb 8.3; hct 27.4; mcv 91.0; plt 226; glucose 89; bun 64; creat 1.8; k+3.9;na++143 05-02-13: hgb 8.8; hct 29.8 05-17-13: wbc 9.5; hgb 7.6; hct 24.1; mcv 89.9; plt 233; glucose 111; bun 49; creat 1.77; k+4.1na++142 Alk phos 189; albumin 2.1 05-18-13; wbc 10.3; hgb 8.7; hct 27.3; mcv 90.4; plt218; glucose 93; bun 45; creat 1.84; k+3.6na++140 Alk phos 167; albumin 2.2 05-21-13: wbc 9.8; hgb 10.1; hct 32.5; mcv 90.8 plt 216  05-29-13: wbc 9.9; hgb 9.4; hct 30.7; mcv 91.9; plt 239; iron 43; uibx 159; tibc 202;   Uric acid 10.3   Review of Systems  Constitutional: Negative for malaise/fatigue.  Respiratory: Negative for cough and shortness of breath.   Cardiovascular: Negative for chest pain, palpitations and leg swelling.  Gastrointestinal: Negative for heartburn, abdominal pain and constipation.  Genitourinary: Negative for dysuria.  Musculoskeletal: Positive for joint pain. Negative for myalgias.       Right knee worse than left knee  Skin: Negative.   Neurological: Negative for headaches.  Psychiatric/Behavioral: The patient is not nervous/anxious.     Physical Exam  Constitutional: He is oriented to person, place, and time. He appears well-developed and well-nourished.  Neck: Neck supple. No JVD present. No thyromegaly present.  Cardiovascular: Normal rate, regular rhythm and intact distal pulses.   Respiratory: Effort normal and breath sounds normal. No respiratory distress. He has no wheezes.  GI: Soft. Bowel sounds are normal. He exhibits no distension. There is no tenderness.  Musculoskeletal: Normal range of motion. He exhibits no edema.  Uses wheelchair; right knee is slightly tender to palpation  Neurological: He is alert and oriented to person, place, and time.  Skin: Skin is warm and dry.       ASSESSMENT/ PLAN:  Gout His uric acid level is elevated at 10.3 he has completed a prednisone dose of 40 mg for three days with some relief of his knee pain; he has more pain in his left than right knee. Will begin colchicine 0.6 mg daily for one week; will then begin allopurinol 100 mg daily to prevent further outbreaks and will monitor his status   Anemia His hgb is stable at 9.4; will not make changes and will monitor his status .

## 2013-06-07 DIAGNOSIS — J438 Other emphysema: Secondary | ICD-10-CM | POA: Insufficient documentation

## 2013-07-15 ENCOUNTER — Ambulatory Visit: Payer: Self-pay | Admitting: Pharmacist Clinician (PhC)/ Clinical Pharmacy Specialist

## 2013-07-15 DIAGNOSIS — Z7901 Long term (current) use of anticoagulants: Secondary | ICD-10-CM

## 2013-07-15 DIAGNOSIS — I4891 Unspecified atrial fibrillation: Secondary | ICD-10-CM

## 2013-07-24 ENCOUNTER — Other Ambulatory Visit (HOSPITAL_COMMUNITY): Payer: Self-pay | Admitting: *Deleted

## 2013-07-25 ENCOUNTER — Encounter (HOSPITAL_COMMUNITY)
Admission: RE | Admit: 2013-07-25 | Discharge: 2013-07-25 | Disposition: A | Discharge status: Home / Self Care | Payer: PRIVATE HEALTH INSURANCE | Source: Ambulatory Visit | Attending: Internal Medicine | Admitting: Internal Medicine

## 2013-07-25 DIAGNOSIS — D649 Anemia, unspecified: Secondary | ICD-10-CM | POA: Insufficient documentation

## 2013-07-25 MED ORDER — FERUMOXYTOL INJECTION 510 MG/17 ML
1020.0000 mg | Freq: Once | INTRAVENOUS | Status: AC
  Administered 2013-07-25: 1020 mg via INTRAVENOUS
  Filled 2013-07-25: qty 34

## 2013-07-29 ENCOUNTER — Other Ambulatory Visit (HOSPITAL_COMMUNITY): Payer: Self-pay | Admitting: *Deleted

## 2013-07-31 ENCOUNTER — Ambulatory Visit (HOSPITAL_COMMUNITY): Payer: PRIVATE HEALTH INSURANCE

## 2013-08-01 ENCOUNTER — Encounter (HOSPITAL_COMMUNITY)
Admission: RE | Admit: 2013-08-01 | Discharge: 2013-08-01 | Disposition: A | Discharge status: Home / Self Care | Payer: PRIVATE HEALTH INSURANCE | Source: Ambulatory Visit | Attending: Internal Medicine | Admitting: Internal Medicine

## 2013-08-01 DIAGNOSIS — D649 Anemia, unspecified: Secondary | ICD-10-CM | POA: Insufficient documentation

## 2013-08-01 LAB — CBC
HCT: 25.2 % — ABNORMAL LOW (ref 39.0–52.0)
MCV: 93.3 fL (ref 78.0–100.0)
RBC: 2.7 MIL/uL — ABNORMAL LOW (ref 4.22–5.81)
WBC: 8.2 10*3/uL (ref 4.0–10.5)

## 2013-08-01 MED ORDER — DARBEPOETIN ALFA-POLYSORBATE 60 MCG/0.3ML IJ SOLN
INTRAMUSCULAR | Status: AC
  Filled 2013-08-01: qty 0.3

## 2013-08-01 MED ORDER — DARBEPOETIN ALFA-POLYSORBATE 60 MCG/0.3ML IJ SOLN
60.0000 ug | Freq: Once | INTRAMUSCULAR | Status: AC
  Administered 2013-08-01: 60 ug via SUBCUTANEOUS

## 2013-08-05 ENCOUNTER — Other Ambulatory Visit: Payer: Self-pay | Admitting: Adult Health

## 2013-10-18 ENCOUNTER — Encounter (HOSPITAL_COMMUNITY): Payer: Self-pay | Admitting: Emergency Medicine

## 2013-10-18 ENCOUNTER — Inpatient Hospital Stay (HOSPITAL_COMMUNITY)
Admission: AD | Admit: 2013-10-18 | Discharge: 2013-10-23 | DRG: 291 | Disposition: A | Discharge status: Home Health | Payer: PRIVATE HEALTH INSURANCE | Source: Ambulatory Visit | Attending: Internal Medicine | Admitting: Internal Medicine

## 2013-10-18 DIAGNOSIS — I359 Nonrheumatic aortic valve disorder, unspecified: Secondary | ICD-10-CM | POA: Diagnosis present

## 2013-10-18 DIAGNOSIS — E8770 Fluid overload, unspecified: Secondary | ICD-10-CM

## 2013-10-18 DIAGNOSIS — I5043 Acute on chronic combined systolic (congestive) and diastolic (congestive) heart failure: Principal | ICD-10-CM | POA: Diagnosis present

## 2013-10-18 DIAGNOSIS — R627 Adult failure to thrive: Secondary | ICD-10-CM | POA: Diagnosis present

## 2013-10-18 DIAGNOSIS — Z87891 Personal history of nicotine dependence: Secondary | ICD-10-CM

## 2013-10-18 DIAGNOSIS — E785 Hyperlipidemia, unspecified: Secondary | ICD-10-CM | POA: Diagnosis present

## 2013-10-18 DIAGNOSIS — G589 Mononeuropathy, unspecified: Secondary | ICD-10-CM | POA: Diagnosis present

## 2013-10-18 DIAGNOSIS — I509 Heart failure, unspecified: Secondary | ICD-10-CM | POA: Diagnosis present

## 2013-10-18 DIAGNOSIS — M109 Gout, unspecified: Secondary | ICD-10-CM | POA: Diagnosis present

## 2013-10-18 DIAGNOSIS — I2789 Other specified pulmonary heart diseases: Secondary | ICD-10-CM | POA: Diagnosis present

## 2013-10-18 DIAGNOSIS — D649 Anemia, unspecified: Secondary | ICD-10-CM | POA: Diagnosis present

## 2013-10-18 DIAGNOSIS — L0390 Cellulitis, unspecified: Secondary | ICD-10-CM

## 2013-10-18 DIAGNOSIS — I5032 Chronic diastolic (congestive) heart failure: Secondary | ICD-10-CM | POA: Diagnosis present

## 2013-10-18 DIAGNOSIS — I10 Essential (primary) hypertension: Secondary | ICD-10-CM | POA: Diagnosis present

## 2013-10-18 DIAGNOSIS — I129 Hypertensive chronic kidney disease with stage 1 through stage 4 chronic kidney disease, or unspecified chronic kidney disease: Secondary | ICD-10-CM | POA: Diagnosis present

## 2013-10-18 DIAGNOSIS — Z8546 Personal history of malignant neoplasm of prostate: Secondary | ICD-10-CM

## 2013-10-18 DIAGNOSIS — E669 Obesity, unspecified: Secondary | ICD-10-CM | POA: Diagnosis present

## 2013-10-18 DIAGNOSIS — Z8249 Family history of ischemic heart disease and other diseases of the circulatory system: Secondary | ICD-10-CM

## 2013-10-18 DIAGNOSIS — K219 Gastro-esophageal reflux disease without esophagitis: Secondary | ICD-10-CM | POA: Diagnosis present

## 2013-10-18 DIAGNOSIS — J189 Pneumonia, unspecified organism: Secondary | ICD-10-CM | POA: Diagnosis present

## 2013-10-18 DIAGNOSIS — Z7901 Long term (current) use of anticoagulants: Secondary | ICD-10-CM

## 2013-10-18 DIAGNOSIS — Z923 Personal history of irradiation: Secondary | ICD-10-CM

## 2013-10-18 DIAGNOSIS — I25810 Atherosclerosis of coronary artery bypass graft(s) without angina pectoris: Secondary | ICD-10-CM | POA: Diagnosis present

## 2013-10-18 DIAGNOSIS — L02419 Cutaneous abscess of limb, unspecified: Secondary | ICD-10-CM | POA: Diagnosis present

## 2013-10-18 DIAGNOSIS — N183 Chronic kidney disease, stage 3 unspecified: Secondary | ICD-10-CM | POA: Diagnosis present

## 2013-10-18 DIAGNOSIS — E039 Hypothyroidism, unspecified: Secondary | ICD-10-CM | POA: Diagnosis present

## 2013-10-18 DIAGNOSIS — Z79899 Other long term (current) drug therapy: Secondary | ICD-10-CM

## 2013-10-18 DIAGNOSIS — I4891 Unspecified atrial fibrillation: Secondary | ICD-10-CM | POA: Diagnosis present

## 2013-10-18 DIAGNOSIS — L97909 Non-pressure chronic ulcer of unspecified part of unspecified lower leg with unspecified severity: Secondary | ICD-10-CM | POA: Diagnosis present

## 2013-10-18 HISTORY — DX: Heart failure, unspecified: I50.9

## 2013-10-18 LAB — COMPREHENSIVE METABOLIC PANEL
Alkaline Phosphatase: 176 U/L — ABNORMAL HIGH (ref 39–117)
BUN: 41 mg/dL — ABNORMAL HIGH (ref 6–23)
CO2: 26 mEq/L (ref 19–32)
Calcium: 9.4 mg/dL (ref 8.4–10.5)
Creatinine, Ser: 1.71 mg/dL — ABNORMAL HIGH (ref 0.50–1.35)
GFR calc Af Amer: 39 mL/min — ABNORMAL LOW (ref >90)
GFR calc non Af Amer: 33 mL/min — ABNORMAL LOW (ref >90)
Glucose, Bld: 93 mg/dL (ref 70–99)
Potassium: 4.3 mEq/L (ref 3.5–5.1)
Sodium: 149 mEq/L — ABNORMAL HIGH (ref 135–145)
Total Protein: 8.2 g/dL (ref 6.0–8.3)

## 2013-10-18 LAB — CBC
HCT: 29.6 % — ABNORMAL LOW (ref 39.0–52.0)
Hemoglobin: 9.4 g/dL — ABNORMAL LOW (ref 13.0–17.0)
RBC: 3.08 MIL/uL — ABNORMAL LOW (ref 4.22–5.81)
WBC: 7.8 10*3/uL (ref 4.0–10.5)

## 2013-10-18 LAB — PRO B NATRIURETIC PEPTIDE: Pro B Natriuretic peptide (BNP): 18021 pg/mL — ABNORMAL HIGH (ref 0–450)

## 2013-10-18 MED ORDER — FUROSEMIDE 10 MG/ML IJ SOLN
INTRAMUSCULAR | Status: AC
  Filled 2013-10-18: qty 8

## 2013-10-18 MED ORDER — VANCOMYCIN HCL IN DEXTROSE 1-5 GM/200ML-% IV SOLN
1000.0000 mg | Freq: Once | INTRAVENOUS | Status: AC
Start: 2013-10-18 — End: 2013-10-18
  Administered 2013-10-18: 1000 mg via INTRAVENOUS
  Filled 2013-10-18 (×2): qty 200

## 2013-10-18 MED ORDER — ENOXAPARIN SODIUM 30 MG/0.3ML ~~LOC~~ SOLN
30.0000 mg | SUBCUTANEOUS | Status: DC
  Administered 2013-10-18 – 2013-10-20 (×3): 30 mg via SUBCUTANEOUS
  Filled 2013-10-18 (×5): qty 0.3

## 2013-10-18 MED ORDER — FEBUXOSTAT 40 MG PO TABS
40.0000 mg | ORAL_TABLET | Freq: Every day | ORAL | Status: DC
  Administered 2013-10-18 – 2013-10-23 (×6): 40 mg via ORAL
  Filled 2013-10-18 (×6): qty 1

## 2013-10-18 MED ORDER — ALLOPURINOL 100 MG PO TABS
100.0000 mg | ORAL_TABLET | Freq: Every day | ORAL | Status: DC
  Filled 2013-10-18: qty 1

## 2013-10-18 MED ORDER — FUROSEMIDE 10 MG/ML IJ SOLN
60.0000 mg | Freq: Two times a day (BID) | INTRAMUSCULAR | Status: DC
  Administered 2013-10-18 – 2013-10-20 (×4): 60 mg via INTRAVENOUS
  Filled 2013-10-18 (×5): qty 6

## 2013-10-18 MED ORDER — SODIUM CHLORIDE 0.9 % IJ SOLN
3.0000 mL | Freq: Two times a day (BID) | INTRAMUSCULAR | Status: DC
  Administered 2013-10-18 – 2013-10-22 (×8): 3 mL via INTRAVENOUS

## 2013-10-18 MED ORDER — ACETAMINOPHEN 325 MG PO TABS
650.0000 mg | ORAL_TABLET | Freq: Four times a day (QID) | ORAL | Status: DC | PRN
  Administered 2013-10-19 – 2013-10-22 (×4): 650 mg via ORAL
  Filled 2013-10-18 (×3): qty 2

## 2013-10-18 MED ORDER — DOXYCYCLINE HYCLATE 100 MG PO TABS
100.0000 mg | ORAL_TABLET | Freq: Two times a day (BID) | ORAL | Status: DC
  Administered 2013-10-18 – 2013-10-20 (×5): 100 mg via ORAL
  Filled 2013-10-18 (×8): qty 1

## 2013-10-18 MED ORDER — ATORVASTATIN CALCIUM 10 MG PO TABS
10.0000 mg | ORAL_TABLET | Freq: Every day | ORAL | Status: DC
  Administered 2013-10-19 – 2013-10-22 (×4): 10 mg via ORAL
  Filled 2013-10-18 (×5): qty 1

## 2013-10-18 MED ORDER — ENSURE COMPLETE PO LIQD
237.0000 mL | Freq: Every day | ORAL | Status: DC
  Administered 2013-10-19 – 2013-10-22 (×4): 237 mL via ORAL

## 2013-10-18 MED ORDER — ACETAMINOPHEN 650 MG RE SUPP: 650.0000 mg | Freq: Four times a day (QID) | RECTAL | Status: DC | PRN

## 2013-10-18 MED ORDER — LOSARTAN POTASSIUM 50 MG PO TABS
50.0000 mg | ORAL_TABLET | Freq: Every day | ORAL | Status: DC
  Administered 2013-10-19 – 2013-10-21 (×3): 50 mg via ORAL
  Filled 2013-10-18 (×4): qty 1

## 2013-10-18 MED ORDER — SENNOSIDES-DOCUSATE SODIUM 8.6-50 MG PO TABS
1.0000 | ORAL_TABLET | Freq: Every evening | ORAL | Status: DC | PRN
  Filled 2013-10-18: qty 1

## 2013-10-18 MED ORDER — METOPROLOL TARTRATE 50 MG PO TABS
50.0000 mg | ORAL_TABLET | Freq: Two times a day (BID) | ORAL | Status: DC
  Administered 2013-10-19 – 2013-10-23 (×5): 50 mg via ORAL
  Filled 2013-10-18 (×12): qty 1

## 2013-10-18 NOTE — Consult Note (Signed)
WOC wound consult note Reason for Consult: Consult requested for right leg wounds.  Pt is a poor historian and is unable to explain topical treatment he has been using or if he has assistance with wound care prior to admission.  Current treatment on wound is hydrofiber and ace wrap.  Will continue present plan of care. Wound type: Full thickness wounds to anterior and posterior right leg. Measurement:Posterior leg full thickness 4X4X.1cm, 100% red and moist, no odor, mod amt tan drainage. Anterior leg with full thickness wound 3X3X.1cm; 100% red and moist, mod amt yellow drainage, no odor. Intact clear-fluid-filled blisters located next to the open wound.  Dressing procedure/placement/frequency: Continue present plan of care with Aquacel to absorb drainage and ace wrap for light compression. Please re-consult if further assistance is needed.  Thank-you,  Cammie Mcgee MSN, RN, CWOCN, [place], CNS [phone]

## 2013-10-18 NOTE — Progress Notes (Signed)
Utilization Review Completed.[name] [name] T12/19/2014  

## 2013-10-18 NOTE — Progress Notes (Signed)
CSW received consult for post hospital needs. Patient unavailable to speak to social worker at this time. CSW will pass this on to weekend for a weekend social worker to follow up.  Maree Krabbe, MSW, [facility] [phone]

## 2013-10-18 NOTE — H&P (Signed)
Vital Signs  Entered weight:  185  lbs., Calculated Weight: 185 lbs., ( 83.92 kg) Height: 65.5 in., ( 166.37 cm) Temperature: 97.4 deg F, Temperature site: oral Pulse rate: 124  Blood Pressure #1: 166 / 62 mm Hg    BMI: 30.32 BSA: 1.92 Wt Chg: 12 lbs since 08/27/2013  Vitals entered by: Donetta Potts CMA on October 18, 2013 11:27 AM  Pulse Oximetry  O2 Saturation: 95 % Comments: on room air        Risk Factors:   Smoked Tobacco Use:  Former smoker       Years Since Last Quit:  29    Counseled :  No Passive smoke exposure:  no Drug use:  no HIV high-risk behavior:  no Caffeine use:  0 drinks per day Alcohol use:  no Exercise:  no Seatbelt use:  100 % Sun Exposure:  frequently  Previous Tobacco Use: Signed On - 08/27/2013 Smoked Tobacco Use:  Former smoker       Year quit:  1985       Years Since Last Quit:  29 years, 11 months, 18 days    Counseled :  No Passive smoke exposure:  no Drug use:  no HIV high-risk behavior:  no Caffeine use:  0 drinks per day  Previous Alcohol Use: Signed On - 08/27/2013 Alcohol use:  no Exercise:  no Seatbelt use:  100 % Sun Exposure:  frequently  Colonoscopy History:    Date of Last Colonoscopy:  06/01/2006  History of Present Illness  Reason for visit: Work-In Sick Visit Chief Complaint: Right lower leg red and warm.  HHN reported O2 Sats of 80% History of Present Illness: 5 Male here for Sick W/In OV Called by [facility] c increasing weight. RLE Blisters. Cellulitis. Missed one day of meds. 12# weight gain. more SOB and clearly in CHF exacerbation. I offerred and push admission for eval and Rx. He agrees. [facility] called today with the update.    Review of Systems  General:       Denies fevers, chills, headache, sweats, anorexia, fatigue, malaise, weight loss.   Eyes:       Denies blurring, diplopia, irritation, discharge, vision loss, eye pain, photophobia.   Cardiovascular:       Complains of dyspnea on exertion,  peripheral edema.        Denies chest pains, claudication, palpitations, syncope, orthopnea, PND.   Respiratory:       Complains of dyspnea.        Denies cough, excessive sputum, hemoptysis, wheezing.   Gastrointestinal:       Denies nausea, vomiting, diarrhea, constipation, heartburn, change in bowel habits, abdominal pain, melena, hematochezia, jaundice.   Skin:       Complains of rash.        blisters and cellulitis. Neurologic:       Complains of tremors, gait instability.        walker Psychiatric:       Denies depression, anxiety, memory loss, mental disturbance, suicidal ideation, hallucinations, paranoia.   Heme/Lymphatic:       Denies abnormal bruising, bleeding, enlarged lymph nodes.    Past History Past Medical History (reviewed - no changes required): Coronary artery disease (CABG 1996) followed by Dr Allyson Sabal,   Atrial fibrillation on chronic coumadin,  GERD/LPR,  Gout,  Hyperlipidemia,  Hypertension,   Benign neoplasm of rectum and anal canal (Colonoscopy August 2007, Dr. Randa Evens),  Cataracts, bilateral,    Diverticular disease,  Fatty liver (Ultrasound 07/2008),  Neuropathy Prostate cancer (June 1996 S/P XRT x 38; recurrance May 2006 S/P cryotherapy),  CKD/Chronic Renal Insufficiency with microalbuminuria,  Tremor, essential (Benign),  Ulcerative colitis,  Grief,  Gout,  Anemia Rad proctitis Admission 03/2013; Hematuria and LGIB admission 04/2013 - s/p 2 & 5 U PRBCs. AFTT - SNF DNR Surgical History (reviewed - no changes required): Recurrent prostatic carcinoma S/P Cryoablation of the prostate. Dr Vonita Moss (May 2006) CABG x 6 (1996) Appendectomy (1953) Family History (reviewed - no changes required): Father:  Deceased at age 15; history of CAD. Mother:  Deceased at age 71; history of CAD.  Social History (reviewed - no changes required): Patient is Widowed and has two daughters and three grandchildren.  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  Patient has  history of tobacco abuse; he quit in 1985.  Denies alcohol consumption or illicit drug use.  Family History Summary:     Reviewed history Last on 08/27/2013 and no changes required:10/18/2013 Father Baker Pierini.) - Has Family History of Heart Disease - Entered On: 04/15/2013  General Comments - FH: Father:  Deceased at age 4; history of CAD. Mother:  Deceased at age 52; history of CAD.   Social History:    Reviewed history from 06/02/2010 and no changes required:       Patient is Widowed and has two daughters and three grandchildren.  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  Patient has history of tobacco abuse; he quit in 1985.  Denies alcohol consumption or illicit drug use.   Physical Exam  General appearance: swollen and dyspnea  Eyes  External: conjunctivae and lids normal Pupils: equal, round, reactive to light and accommodation  Ears, Nose and Throat  External ears: normal, no lesions or deformities External nose: normal, no lesions or deformities Otoscopic: canals clear, tympanic membranes intact, no fluid Hearing: grossly intact Nasal: mucosa, septum, and turbinates normal Dental: good dentition Pharynx: tongue normal, protrudes mid line,  posterior pharynx without erythema or exudate  Neck  Neck: supple, no masses, trachea midline  Respiratory  Respiratory effort: no intercostal retractions or use of accessory muscles.  Auscultation: tachypnea and congestion.  Cardiovascular  Palpation: no thrill or palpable murmurs, no displacement of PMI.  Sternotomy. Auscultation: occ irreg beat , no murmur, rub, or gallop Pedal pulses: pulses 1+ symmetric Periph. circulation: B LE 2-3 + Edema c RLE Blisters and cellulitis,.  Gastrointestinal  Abdomen: soft, non-tender, no masses, bowel sounds normal Liver and spleen: no enlargement or nodularity  Lymphatic  Neck: no cervical adenopathy  Musculoskeletal  Gait and station: walker, steady  Skin  Inspection:  cellulitis  Mental Status Exam  Judgment, insight: limited, guarded, assisted by dtr, Darel Hong   Impression & Recommendations:  Problem # 1:  Obesity (ICD-278.00) (ICD10-E66.9)  196 in June 2014. 164 in Sept 2014 173 08/17/2013 Now fluid had re-accumulated and weight back to 185  Problem # 2:  Aortic stenosis (ICD-424.1) (ICD10-I35.0)  His updated medication list for this problem includes:    Metoprolol Tartrate 50 Mg Tabs (Metoprolol tartrate) .Marland Kitchen... Take one tablet by mouth twice a day  Mod AS - c goal to keep Euvolemic. Not critical AS Diurese and watch for [facility]   Problem # 3:  Diastolic dysfunction (ICD-429.9) (ICD10-I51.89) Push Lasix and get Edema off.  Problem # 4:  Pulmonary hypertension (ICD-416.8) (ICD10-I27.2)  Keep BP controlled. BP high due to Volume.   Problem # 5:  CHRONIC KIDNEY DISEASE  STAGE III (MODERATE) (ICD-585.3) (ICD10-N18.3)  Labs Reviewed: BUN: 32 (08/27/2013)   Cr: 1.5 (08/27/2013)    Hgb: 9.5 (08/27/2013)   Hct: 29.2 (08/27/2013)   Ca++: 9.5 (08/27/2013)    TP: 7.9 (08/27/2013)   Alb: 3.2 (08/27/2013)  Cr: 1.8 (07/15/2013)    Cr: 1.8 (04/15/2013)     Cr 1.88 on July 20th. Est Cr Cl is 30cc/min  Follow labs abd Cr  Problem # 6:  ADULT FAILURE TO THRIVE (ICD-783.7) (ICD10-R62.7)  Lives by self. Daughter Darel Hong and services helping. [employer] encouraged. DNR   Problem # 7:  ATRIAL FIBRILLATION (ICD-427.31) (ICD10-I48.0)  His updated medication list for this problem includes:    Metoprolol Tartrate 50 Mg Tabs (Metoprolol tartrate) .Marland Kitchen... Take one tablet by mouth twice a day  Not appropriate for Coumadin or novel agents Dr Allyson Sabal prn ECHO in hospital 03/2013 EF 55-60%. Mod AS c Severe Pulm HTN - PAP 73   Problem # 8:  Congestive heart failure (CHF) (ICD-428.0) (ICD10-I50.9) Admit for R CHF and diuresis.  His updated medication list for this problem includes:    Furosemide 20 Mg Tabs (Furosemide) ..... One pill daily in am for edema.     Cozaar 50 Mg Tabs (Losartan potassium) .Marland Kitchen... 1 qd    Metoprolol Tartrate 50 Mg Tabs (Metoprolol tartrate) .Marland Kitchen... Take one tablet by mouth twice a day   Problem # 9:  Cellulitis (ICD-682.9) (ICD10-L03.90) Will Rx c IV Abx.   Problem # 10:  Blister, foot (ICD-917.2) (ICD10-S90.829) he is already wrapped. Wound care consult Should improve c Abx and diuresis and leg elevation.   Problem # 11:  ANEMIA (ICD-285.9) (ICD10-D64.9)  His updated medication list for this problem includes:    Ferrous Sulfate 325 (65 Fe) Mg Tabs (Ferrous sulfate) .Marland Kitchen... 1 po qd   S/p hospitalizations this yr and Transfusions. No current GIB or bladder bleeds. S/P Feraheme  Hgb: 9.5 (08/27/2013)   Hct: 29.2 (08/27/2013)   RBC: 3.0 M/UL (08/27/2013)   MCV: 96.3 (08/27/2013)   MCH: 31.4 (08/27/2013)   MCHC: 32.5 G/DL (16/08/9603) Ferritin: 704.4 (07/15/2013) Iron: 34 (07/16/2013)   % Sat: 18 (07/16/2013) B12: 689 (06/26/2012)     Problem # 12:  CORONARY ARTERY DISEASE (ICD-414.00) (ICD10-I25.10)  His updated medication list for this problem includes:    Furosemide 20 Mg Tabs (Furosemide) ..... One pill daily in am for edema.    Cozaar 50 Mg Tabs (Losartan potassium) .Marland Kitchen... 1 qd    Metoprolol Tartrate 50 Mg Tabs (Metoprolol tartrate) .Marland Kitchen... Take one tablet by mouth twice a day  CAD/CABG/Dr Allyson Sabal 05/16/12 Cards eval = Dr Allyson Sabal. CAD - AFIb - EKG Afib 50, No changes  RF reduction here able he declined stress tests. No Angina. He is off ASA due to risk.  Complete Medication List: 1)  Ferrous Sulfate 325 (65 Fe) Mg Tabs (Ferrous sulfate) .Marland Kitchen.. 1 po qd 2)  Vitamin D 1000 Unit Tabs (Cholecalciferol) .Marland Kitchen.. 1 po qd 3)  Uloric 40 Mg Tabs (Febuxostat) .Marland Kitchen.. 1 po qd 4)  Lipitor 10 Mg Tabs (Atorvastatin calcium) .Marland Kitchen.. 1 qd 5)  Furosemide 20 Mg Tabs (Furosemide) .... One pill daily in am for edema. 6)  Cozaar 50 Mg Tabs (Losartan potassium) .Marland Kitchen.. 1 qd 7)  Tums 500 Mg Chew (Calcium carbonate antacid) .... Take 1 tab daily 8)   Tylenol 325 Mg Tabs (Acetaminophen) .... Take 1 tab in the morning and 1 tab in the evening for pain 9)  Colcrys 0.6 Mg Tabs (Colchicine) .Marland KitchenMarland KitchenMarland Kitchen 1  po qd prn gout 10)  Metoprolol Tartrate 50 Mg Tabs (Metoprolol tartrate) .... Take one tablet by mouth twice a day  Other Orders: Pulse Ox ([id number])    Comments: ADMIT. ] ]  Problem # 13:  HYPERTENSION (ICD-401.9) (ICD10-I10)  His updated medication list for this problem includes:    Furosemide 20 Mg Tabs (Furosemide) ..... One pill daily in am for edema.    Cozaar 50 Mg Tabs (Losartan potassium) .Marland Kitchen... 1 qd    Metoprolol Tartrate 50 Mg Tabs (Metoprolol tartrate) .Marland Kitchen... Take one tablet by mouth twice a day  BP today: 166/62 - diuresis will help. Prior BP: 100/54 (08/27/2013)  Labs Reviewed: Creat: 1.5 (08/27/2013) Chol: 143 (07/15/2013)   LDL: 95 (07/15/2013)      Problem # 14:  TREMOR, ESSENTIAL (ICD-333.1) (ICD10-G25.0)  Stable. Observed.    Problem # 15:  ULCERATIVE COLITIS (ICD-556.9) (ICD10-K51.90)  No Diarrhea.  No Bleeding. No further Rad Proctitis. Dr Randa Evens prn. Not active     Other Orders: Pulse Ox ([id number])     Electronically signed by Gwen Pounds MD on 10/18/2013 at 12:34 PM  ________________________________________________________________________

## 2013-10-18 NOTE — Progress Notes (Addendum)
INITIAL NUTRITION ASSESSMENT  DOCUMENTATION CODES Per approved criteria  -Not Applicable   INTERVENTION: Ensure Complete daily (350 kcal and 13 grams of protein per 8 fl oz bottle) RD to follow for nutrition care plan  NUTRITION DIAGNOSIS: Increased nutrient needs related to wound healing as evidenced by estimated nutrition needs  Goal: Pt to meet >/= 90% of their estimated nutrition needs   Monitor:  PO & supplemental intake, weight, labs, I/O's  Reason for Assessment: Malnutrition Screening Tool Report  79 y.o. male  Admitting Dx: Cellulitis, CHF   ASSESSMENT: Patient with PMH of CAD, HTN and CKD; admitted with cellulitis and CHF exacerbation.  RD unable to obtain nutrition hx at this time; per Malnutrition Screening Tool Report, patient has had a decreased appetite; briefly spoke with son-in-law via telephone who stated usually patient's eats fairly well; per H&P patient has had a 12 lb weight gain, however, no current weight available; RD to order supplement.  CWOCN note reviewed 12/19 -- full thickness wounds to anterior and posterior right leg.  Height: Ht Readings from Last 1 Encounters:  07/25/13 5\' 8"  (1.727 m)    Weight: Wt Readings from Last 1 Encounters:  07/25/13 162 lb (73.483 kg)    Ideal Body Weight: 154 lb  % Ideal Body Weight: 105%  Wt Readings from Last 10 Encounters:  07/25/13 162 lb (73.483 kg)  05/30/13 185 lb (83.915 kg)  05/22/13 185 lb (83.915 kg)  05/16/13 171 lb 1.2 oz (77.6 kg)  04/19/13 171 lb (77.565 kg)  09/30/11 185 lb (83.915 kg)    Usual Body Weight: 162 lb -- September 2014 (no current weight)  % Usual Body Weight: ---  BMI:  26.2 kg/m2 (using 07/25/13 weight)  Estimated Nutritional Needs: Kcal: 1600-1800 Protein: 75-85 gm Fluid: 1.6-1.8 L  Skin: full thickness wounds  Diet Order: Cardiac  EDUCATION NEEDS: -No education needs identified at this time  Scheduled Meds: . [START ON 10/19/2013] atorvastatin  10 mg  Oral q1800  . doxycycline  100 mg Oral Q12H  . enoxaparin (LOVENOX) injection  30 mg Subcutaneous Q24H  . febuxostat  40 mg Oral Daily  . furosemide  60 mg Intravenous BID  . [START ON 10/19/2013] losartan  50 mg Oral Daily  . metoprolol  50 mg Oral BID  . sodium chloride  3 mL Intravenous Q12H  . vancomycin  1,000 mg Intravenous Once    Continuous Infusions:   Past Medical History  Diagnosis Date  . Hypertension   . History of prostate cancer 1996    treated with radiation  . Arthritis   . Gout, arthritis 2010    bil feet  . Cancer     PORSTATE-HX OF RADIATION AND SURGERY  . Diverticulosis of colon   . CAD (coronary artery disease)     cabg 1996; echo 03/15/2007-EF 45-50%, LA mod to severe dilated, mod TR, mod pulmonary HTN, mod sclerotic aortic valve; myoview6/23/2010- no ischemia  . A-fib     chronic, no longer on coumadin due to rectal bleeding  . Hyperlipidemia   . Rectal bleed   . CHF (congestive heart failure) 10/18/13    Past Surgical History  Procedure Laterality Date  . Coronary artery bypass graft  04/1995    LIMA to LAD, vein graft to diag branch, vein to 3 sequential marginal branches, and vein to PDA  . Appendectomy  1950s  . Eye surgery  2009    cataract surgery  . Cryoablation of prostate  06/23/2005  .  Colonoscopy  11/03/2000  . Cardiac catheterization  05/04/2005    patent grafts  . Cardiac catheterization  04/10/1995    presyncopal episode on way to OR, 3V disease with left main involvement, nl LV, CVTS was contacted    Maureen Chatters, RD, LDN Pager #: [phone] After-Hours Pager #: [phone]

## 2013-10-19 ENCOUNTER — Inpatient Hospital Stay (HOSPITAL_COMMUNITY): Payer: PRIVATE HEALTH INSURANCE

## 2013-10-19 LAB — CBC
HCT: 26.8 % — ABNORMAL LOW (ref 39.0–52.0)
Hemoglobin: 8.5 g/dL — ABNORMAL LOW (ref 13.0–17.0)
MCV: 93.7 fL (ref 78.0–100.0)
Platelets: 149 10*3/uL — ABNORMAL LOW (ref 150–400)
RBC: 2.86 MIL/uL — ABNORMAL LOW (ref 4.22–5.81)
RDW: 15.9 % — ABNORMAL HIGH (ref 11.5–15.5)
WBC: 7.5 10*3/uL (ref 4.0–10.5)

## 2013-10-19 LAB — TSH: TSH: 8.486 u[IU]/mL — ABNORMAL HIGH (ref 0.350–4.500)

## 2013-10-19 LAB — BASIC METABOLIC PANEL
CO2: 27 mEq/L (ref 19–32)
Chloride: 112 mEq/L (ref 96–112)
GFR calc Af Amer: 36 mL/min — ABNORMAL LOW (ref >90)
Potassium: 3.4 mEq/L — ABNORMAL LOW (ref 3.5–5.1)
Sodium: 149 mEq/L — ABNORMAL HIGH (ref 135–145)

## 2013-10-19 MED ORDER — POTASSIUM CHLORIDE CRYS ER 20 MEQ PO TBCR
20.0000 meq | EXTENDED_RELEASE_TABLET | Freq: Every day | ORAL | Status: DC
  Administered 2013-10-19 – 2013-10-20 (×2): 20 meq via ORAL
  Filled 2013-10-19 (×3): qty 1

## 2013-10-19 NOTE — Evaluation (Signed)
  Physical Therapy Evaluation Patient Details Name: [name] [name] MRN: [id number] DOB: 11-07-1920 Today's Date: 10/19/2013 Time: 7829-5621 PT Time Calculation (min): 15 min  PT Assessment / Plan / Recommendation History of Present Illness  Pt adm with CHF exacerbation  Clinical Impression  Patient evaluated by Physical Therapy with no further acute PT needs identified. All education has been completed and the patient has no further questions. Pt has been managing at home with [facility] and assist of his daughters (at least from a mobility standpoint--medically may not be managing as well).  See below for any follow-up Physial Therapy or equipment needs. PT is signing off. Thank you for this referral.     PT Assessment  Patent does not need any further PT services    Follow Up Recommendations  No PT follow up    Does the patient have the potential to tolerate intense rehabilitation      Barriers to Discharge        Equipment Recommendations  None recommended by PT    Recommendations for Other Services     Frequency      Precautions / Restrictions Precautions Precautions: Fall   Pertinent Vitals/Pain Denied pain SaO2 on RA 91% With ambulation 89-90%      Mobility  Bed Mobility Bed Mobility: Not assessed Transfers Transfers: Sit to Stand;Stand to Sit Sit to Stand: 5: Supervision Stand to Sit: 5: Supervision Details for Transfer Assistance: Pt demonstrated safe, proper use of RW Ambulation/Gait Ambulation/Gait Assistance: 5: Supervision Ambulation Distance (Feet): 100 Feet Assistive device: Rolling walker Ambulation/Gait Assistance Details: vc for upright posture, otherwise pt safe with negotiating turns and in small spaces around objects Gait Pattern: Step-through pattern;Decreased stride length Gait velocity: decr    Exercises     PT Diagnosis:    PT Problem List:   PT Treatment Interventions:       PT Goals(Current goals can be found in the care plan  section) Acute Rehab PT Goals PT Goal Formulation: No goals set, d/c therapy  Visit Information  Last PT Received On: 10/19/13 Assistance Needed: +1 History of Present Illness: Pt adm with CHF exacerbation       Prior Functioning  Home Living Family/patient expects to be discharged to:: Private residence Living Arrangements: Alone Available Help at Discharge: Family;Available PRN/intermittently Type of Home: House Home Access: Ramped entrance Home Layout: One level Home Equipment: Cane - single point;Walker - 2 wheels Additional Comments: left SNF in ?Sept and has been managing at home with assist of daughters Prior Function Level of Independence: Independent with assistive device(s) Comments: uses RW due to unsteadiness Communication Communication: HOH    Cognition  Cognition Arousal/Alertness: Awake/alert Behavior During Therapy: WFL for tasks assessed/performed Overall Cognitive Status: Within Functional Limits for tasks assessed    Extremity/Trunk Assessment Upper Extremity Assessment Upper Extremity Assessment: Generalized weakness Lower Extremity Assessment Lower Extremity Assessment: Generalized weakness Cervical / Trunk Assessment Cervical / Trunk Assessment: Kyphotic   Balance    End of Session PT - End of Session Equipment Utilized During Treatment: Gait belt Activity Tolerance: Patient tolerated treatment well Patient left: in chair;with call bell/phone within reach;with nursing/sitter in room Nurse Communication: Mobility status  GP     Martrell Eguia 10/19/2013, 9:41 AM Pager [phone]

## 2013-10-19 NOTE — Progress Notes (Addendum)
Subjective: Patient in no apparent distress, states that breathing is better overnight, however is having significant moist cough,, continues to have some episodic discomfort in the lower leg ulcers, seen by wound care nurse within the last 24 hours, rewrapped. Denies any fevers, chills, overt shortness of breath.  Objective: Vital signs in last 24 hours: Temp:  [97.4 F (36.3 C)-98.4 F (36.9 C)] 98.4 F (36.9 C) (12/20 0338) Pulse Rate:  [53-62] 54 (12/20 0338) Resp:  [18] 18 (12/20 0338) BP: (117-148)/(45-58) 117/45 mmHg (12/20 0338) SpO2:  [94 %-97 %] 94 % (12/20 0338) Weight:  [80.151 kg (176 lb 11.2 oz)] 80.151 kg (176 lb 11.2 oz) (12/20 0338) Weight change:  Last BM Date: 10/19/13  CBG (last 3)  No results found for this basename: GLUCAP,  in the last 72 hours  Intake/Output from previous day: 12/19 0701 - 12/20 0700 In: -  Out: 700 [Urine:700] Intake/Output this shift: Total I/O In: -  Out: 1 [Stool:1]  Elderly chronically ill-appearing male sitting in bed, dozing but easily arousable No oropharyngeal lesions, tongue midline Neck supple, no cervical lymphadenopathy No respiratory distress, no use of accessory muscles, coarse breath sounds with moist cough Vascular reveals distant heart sounds, regular with some ectopy Abdomen-soft, nontender, nondistended, bowel sounds present Extremities reveal wrap on right lower extremity, 1+ edema, cellulitis, extending bilaterally to below the knees, subjectively improved per patient report, not unwrapped, wound care assessment review  Lab Results:  Recent Labs  10/18/13 1350 10/19/13 0430  NA 149* 149*  K 4.3 3.4*  CL 113* 112  CO2 26 27  GLUCOSE 93 97  BUN 41* 42*  CREATININE 1.71* 1.83*  CALCIUM 9.4 9.0    Recent Labs  10/18/13 1350  AST 20  ALT 17  ALKPHOS 176*  BILITOT 0.6  PROT 8.2  ALBUMIN 3.2*    Recent Labs  10/18/13 1350 10/19/13 0430  WBC 7.8 7.5  HGB 9.4* 8.5*  HCT 29.6* 26.8*  MCV 96.1  93.7  PLT 162 149*   No results found for this basename: CKTOTAL, CKMB, CKMBINDEX, TROPONINI,  in the last 72 hours  Recent Labs  10/18/13 1350  TSH 8.486*   No results found for this basename: VITAMINB12, FOLATE, FERRITIN, TIBC, IRON, RETICCTPCT,  in the last 72 hours  Studies/Results: No results found.   Medications: Scheduled: . atorvastatin  10 mg Oral q1800  . doxycycline  100 mg Oral Q12H  . enoxaparin (LOVENOX) injection  30 mg Subcutaneous Q24H  . febuxostat  40 mg Oral Daily  . feeding supplement (ENSURE COMPLETE)  237 mL Oral QPC supper  . furosemide  60 mg Intravenous BID  . losartan  50 mg Oral Daily  . metoprolol  50 mg Oral BID  . sodium chloride  3 mL Intravenous Q12H   Continuous:   Assessment/Plan: Active Problems:   Volume overload-weight previously 185 pounds now 176 on 2 different scales, patient feels better, appears less edema based on description, will continue current diuresis, BNP significantly elevated.   Acute combined systolic and diastolic congestive heart failure complicated by moderate aortic stenosis with diastolic dysfunction, heart rate controlled, will continue Lasix, watch for worsening chronic kidney disease, urine output adequate   CKD (chronic kidney disease) stage 3, GFR 30-59 ml/min-creatinine 1.8, similar to previous results in the office, however he did have a 1.5 within the last one to 2 months, we'll continue to monitor for worsening renal impairment   Anemia no evidence of clinical bleeding, history of hospitalizations  with transfusions, will follow, continues on iron supplementation and history of iron infusion   CAD (coronary artery disease) of artery bypass graft-multiple issues including congestive heart failure, history of atrial fibrillation, complicated by volume overload, undergoing diuresis, continues on air be therapy beta blocker therapy and statin therapy   HTN (hypertension)-well-controlled on current medication   CHF  (congestive heart failure)-undergoing diuresis we'll continue this I   Cellulitis-we'll continue Doxy, white blood cell count normal    LOS: 1 day   [name] [name] R 10/19/2013, 2:32 PM TSH elevated at greater than 8, unclear if this is secondary to sick euthyroid syndrome, versus true clinical hypothyroidism, will check free T4 total T3 may need to start supplementation

## 2013-10-19 NOTE — Progress Notes (Signed)
Changed patient's aqua seal dressing per MD'S order. Stanton Kidney R

## 2013-10-20 LAB — CBC
HCT: 26.8 % — ABNORMAL LOW (ref 39.0–52.0)
Hemoglobin: 8.5 g/dL — ABNORMAL LOW (ref 13.0–17.0)
MCH: 29.9 pg (ref 26.0–34.0)
MCHC: 31.7 g/dL (ref 30.0–36.0)
RBC: 2.84 MIL/uL — ABNORMAL LOW (ref 4.22–5.81)

## 2013-10-20 LAB — BASIC METABOLIC PANEL
BUN: 50 mg/dL — ABNORMAL HIGH (ref 6–23)
CO2: 28 mEq/L (ref 19–32)
Calcium: 8.9 mg/dL (ref 8.4–10.5)
Chloride: 105 mEq/L (ref 96–112)
GFR calc non Af Amer: 28 mL/min — ABNORMAL LOW (ref >90)
Glucose, Bld: 88 mg/dL (ref 70–99)
Potassium: 3.4 mEq/L — ABNORMAL LOW (ref 3.5–5.1)
Sodium: 144 mEq/L (ref 135–145)

## 2013-10-20 LAB — T4, FREE: Free T4: 0.87 ng/dL (ref 0.80–1.80)

## 2013-10-20 LAB — T3: T3, Total: 48 ng/dl — ABNORMAL LOW (ref 80.0–204.0)

## 2013-10-20 MED ORDER — LEVOFLOXACIN IN D5W 500 MG/100ML IV SOLN
500.0000 mg | INTRAVENOUS | Status: DC
  Administered 2013-10-20: 500 mg via INTRAVENOUS
  Filled 2013-10-20: qty 100

## 2013-10-20 MED ORDER — FUROSEMIDE 10 MG/ML IJ SOLN
40.0000 mg | Freq: Every day | INTRAMUSCULAR | Status: DC
Start: 2013-10-21 — End: 2013-10-21
  Filled 2013-10-20: qty 4

## 2013-10-20 MED ORDER — FUROSEMIDE 10 MG/ML IJ SOLN: 60.0000 mg | Freq: Every day | INTRAMUSCULAR | Status: DC

## 2013-10-20 MED ORDER — LEVOFLOXACIN IN D5W 500 MG/100ML IV SOLN
500.0000 mg | INTRAVENOUS | Status: DC
  Filled 2013-10-20: qty 100

## 2013-10-20 NOTE — Progress Notes (Signed)
Subjective: Swelling improved overnight however still with significant cough that is moist, with voice changes, but no overt fevers, chills, breathing pattern overall improved per patient report. Denies any fevers, chills, overt shortness of breath.  Objective: Vital signs in last 24 hours: Temp:  [97.8 F (36.6 C)-98.3 F (36.8 C)] 98.3 F (36.8 C) (12/21 0421) Pulse Rate:  [50-58] 58 (12/21 0421) Resp:  [18] 18 (12/21 0421) BP: (115-149)/(43-55) 115/43 mmHg (12/21 0421) SpO2:  [94 %-99 %] 94 % (12/21 0421) Weight:  [78.1 kg (172 lb 2.9 oz)] 78.1 kg (172 lb 2.9 oz) (12/21 0421) Weight change: -2.051 kg (-4 lb 8.3 oz) Last BM Date: 10/19/13  CBG (last 3)  No results found for this basename: GLUCAP,  in the last 72 hours  Intake/Output from previous day: 12/20 0701 - 12/21 0700 In: 960 [P.O.:960] Out: 1951 [Urine:1950; Stool:1] Intake/Output this shift: Total I/O In: -  Out: 500 [Urine:500]  Elderly chronically ill-appearing male sitting in bed, conversant with family members present No oropharyngeal lesions, tongue midline Neck supple, no cervical lymphadenopathy No respiratory distress, no use of accessory muscles, coarse breath sounds with moist cough, now diffuse rhonchi Vascular reveals distant heart sounds, regular with some ectopy Abdomen-soft, nontender, nondistended, bowel sounds present Extremities reveal wrap on right lower extremity, tr edema improved, cellulitis, extending bilaterally to below the knees, objectively improved, not unwrapped, wound care assessment review  Lab Results:  Recent Labs  10/19/13 0430 10/20/13 0410  NA 149* 144  K 3.4* 3.4*  CL 112 105  CO2 27 28  GLUCOSE 97 88  BUN 42* 50*  CREATININE 1.83* 1.99*  CALCIUM 9.0 8.9    Recent Labs  10/18/13 1350  AST 20  ALT 17  ALKPHOS 176*  BILITOT 0.6  PROT 8.2  ALBUMIN 3.2*    Recent Labs  10/19/13 0430 10/20/13 0410  WBC 7.5 7.0  HGB 8.5* 8.5*  HCT 26.8* 26.8*  MCV 93.7  94.4  PLT 149* 151   No results found for this basename: CKTOTAL, CKMB, CKMBINDEX, TROPONINI,  in the last 72 hours  Recent Labs  10/18/13 1350  TSH 8.486*   No results found for this basename: VITAMINB12, FOLATE, FERRITIN, TIBC, IRON, RETICCTPCT,  in the last 72 hours  Studies/Results: Dg Chest 2 View  10/19/2013   CLINICAL DATA:  Cough and heart failure  EXAM: CHEST  2 VIEW  COMPARISON:  05/17/2013  FINDINGS: Stable cardiac enlargement. Previous CABG. Chronic streaky opacity at the left base, scarring based on previous CT imaging 05/23/2013. There is an asymmetric airspace type opacity in the right lung. No significant pleural effusion. No peumothorax.  IMPRESSION: 1. Asymmetric opacity in the right lung, primarily concerning for pneumonia or pneumonitis. 2. Chronic scarring at the left base   Electronically Signed   By: Tiburcio Pea M.D.   On: 10/19/2013 22:20     Medications: Scheduled: . atorvastatin  10 mg Oral q1800  . doxycycline  100 mg Oral Q12H  . enoxaparin (LOVENOX) injection  30 mg Subcutaneous Q24H  . febuxostat  40 mg Oral Daily  . feeding supplement (ENSURE COMPLETE)  237 mL Oral QPC supper  . furosemide  60 mg Intravenous BID  . losartan  50 mg Oral Daily  . metoprolol  50 mg Oral BID  . potassium chloride  20 mEq Oral Daily  . sodium chloride  3 mL Intravenous Q12H   Continuous:   Assessment/Plan: Active Problems:   Volume overload-weight previously 185 pounds now 176  on 2 different scales, patient feels better, appears less edema based on description and by exam compared with yesterday, will decrease current diuresis, BNP significantly elevated. Will monitor   Acute combined systolic and diastolic congestive heart failure complicated by moderate aortic stenosis with diastolic dysfunction, heart rate controlled, will decrease Lasix, watch for worsening chronic kidney disease, urine output adequate   CKD (chronic kidney disease) stage 3, GFR 30-59  ml/min-creatinine 1.8, similar to previous results in the office, however he did have a 1.5 within the last one to 2 months, however overnight now increased, will decrease diuresis   Anemia no evidence of clinical bleeding, history of hospitalizations with transfusions, will follow, continues on iron supplementation and history of iron infusion   CAD (coronary artery disease) of artery bypass graft-multiple issues including congestive heart failure, history of atrial fibrillation, complicated by volume overload, undergoing diuresis, continues on air be therapy beta blocker therapy and statin therapy   HTN (hypertension)-well-controlled on current medication   CHF (congestive heart failure)-undergoing diuresis we'll continue this at lower dose   Cellulitis-we'll continue Doxy, white blood cell count normal Pneumonia, right lower lobe, compared with previous from last summer, with clinical deterioration, will add Levaquin given penicillin allergy, will monitor, hopefully can be discharged on oral antibiotics.    LOS: 2 days   [name] [name] R 10/20/2013, 11:40 AM TSH elevated at greater than 8, unclear if this is secondary to sick euthyroid syndrome, versus true clinical hypothyroidism, will check free T4 total T3 may need to start supplementation

## 2013-10-20 NOTE — Progress Notes (Signed)
Clinical Child psychotherapist (CSW) received weekday report stating that CSW had consult for "post hospital needs". Per chart PT is signing off and did not recommend PT follow up. Per RN patient can function at home. Per PT note patient might have problems with medication management. CSW notified RN case manager of above. Please reconsult if further social work needs arise. CSW signing off.   Jetta Lout, LCSWA Weekend CSW [phone]

## 2013-10-21 LAB — BASIC METABOLIC PANEL
BUN: 51 mg/dL — ABNORMAL HIGH (ref 6–23)
CO2: 29 mEq/L (ref 19–32)
Chloride: 106 mEq/L (ref 96–112)
Creatinine, Ser: 2.15 mg/dL — ABNORMAL HIGH (ref 0.50–1.35)
GFR calc Af Amer: 29 mL/min — ABNORMAL LOW (ref >90)
GFR calc non Af Amer: 25 mL/min — ABNORMAL LOW (ref >90)
Glucose, Bld: 85 mg/dL (ref 70–99)
Potassium: 3.6 mEq/L (ref 3.5–5.1)
Sodium: 143 mEq/L (ref 135–145)

## 2013-10-21 LAB — CBC
HCT: 26.9 % — ABNORMAL LOW (ref 39.0–52.0)
Hemoglobin: 8.6 g/dL — ABNORMAL LOW (ref 13.0–17.0)
MCHC: 32 g/dL (ref 30.0–36.0)
MCV: 94.4 fL (ref 78.0–100.0)
Platelets: 169 10*3/uL (ref 150–400)
RBC: 2.85 MIL/uL — ABNORMAL LOW (ref 4.22–5.81)
RDW: 15.7 % — ABNORMAL HIGH (ref 11.5–15.5)
WBC: 8.4 10*3/uL (ref 4.0–10.5)

## 2013-10-21 MED ORDER — FUROSEMIDE 20 MG PO TABS
20.0000 mg | ORAL_TABLET | Freq: Every day | ORAL | Status: DC
  Administered 2013-10-21: 20 mg via ORAL
  Filled 2013-10-21 (×2): qty 1

## 2013-10-21 MED ORDER — LEVOFLOXACIN 250 MG PO TABS
250.0000 mg | ORAL_TABLET | Freq: Every day | ORAL | Status: DC
  Administered 2013-10-21 – 2013-10-22 (×2): 250 mg via ORAL
  Filled 2013-10-21 (×4): qty 1

## 2013-10-21 MED ORDER — LEVOTHYROXINE SODIUM 25 MCG PO TABS
25.0000 ug | ORAL_TABLET | Freq: Every day | ORAL | Status: DC
  Administered 2013-10-21 – 2013-10-23 (×3): 25 ug via ORAL
  Filled 2013-10-21 (×4): qty 1

## 2013-10-21 NOTE — Progress Notes (Signed)
Subjective: Swelling improved dramatically over weekend. Leg looks much better. Mild cough and congestion - being treated for PNA. Cr Bumped with aggressive diuresis  No C/O Looking forward tyo D/c.   Objective: Vital signs in last 24 hours: Temp:  [97.8 F (36.6 C)-98 F (36.7 C)] 97.9 F (36.6 C) (12/22 0415) Pulse Rate:  [59-69] 59 (12/22 0415) Resp:  [17-18] 18 (12/22 0415) BP: (116-128)/(42-68) 116/42 mmHg (12/22 0415) SpO2:  [97 %-100 %] 98 % (12/22 0415) Weight:  [77.7 kg (171 lb 4.8 oz)] 77.7 kg (171 lb 4.8 oz) (12/22 0415) Weight change: -0.4 kg (-14.1 oz) Last BM Date: 10/20/13  CBG (last 3)  No results found for this basename: GLUCAP,  in the last 72 hours  Intake/Output from previous day: 12/21 0701 - 12/22 0700 In: 240 [P.O.:240] Out: 2300 [Urine:2300] Intake/Output this shift:    Elderly chronically ill-appearing male sitting in bed, conversant No oropharyngeal lesions, tongue midline Neck supple, no cervical lymphadenopathy No respiratory distress, no use of accessory muscles, coarse breath sounds with moist cough CV reveals distant heart sounds, regular with some ectopy. SEM. Abdomen-soft, nontender, nondistended, bowel sounds present Extremities reveal wrap on right lower extremity - Removed.  Cellulitis much better.  2 blisters revealed at calf level.  Min Edema for him  Lab Results:  Recent Labs  10/20/13 0410 10/21/13 0447  NA 144 143  K 3.4* 3.6  CL 105 106  CO2 28 29  GLUCOSE 88 85  BUN 50* 51*  CREATININE 1.99* 2.15*  CALCIUM 8.9 8.9    Recent Labs  10/18/13 1350  AST 20  ALT 17  ALKPHOS 176*  BILITOT 0.6  PROT 8.2  ALBUMIN 3.2*    Recent Labs  10/20/13 0410 10/21/13 0447  WBC 7.0 8.4  HGB 8.5* 8.6*  HCT 26.8* 26.9*  MCV 94.4 94.4  PLT 151 169   No results found for this basename: CKTOTAL, CKMB, CKMBINDEX, TROPONINI,  in the last 72 hours  Recent Labs  10/18/13 1350  TSH 8.486*   No results found for this  basename: VITAMINB12, FOLATE, FERRITIN, TIBC, IRON, RETICCTPCT,  in the last 72 hours  Studies/Results: Dg Chest 2 View  10/19/2013   CLINICAL DATA:  Cough and heart failure  EXAM: CHEST  2 VIEW  COMPARISON:  05/17/2013  FINDINGS: Stable cardiac enlargement. Previous CABG. Chronic streaky opacity at the left base, scarring based on previous CT imaging 05/23/2013. There is an asymmetric airspace type opacity in the right lung. No significant pleural effusion. No peumothorax.  IMPRESSION: 1. Asymmetric opacity in the right lung, primarily concerning for pneumonia or pneumonitis. 2. Chronic scarring at the left base   Electronically Signed   By: Tiburcio Pea M.D.   On: 10/19/2013 22:20     Medications: Scheduled: . atorvastatin  10 mg Oral q1800  . doxycycline  100 mg Oral Q12H  . enoxaparin (LOVENOX) injection  30 mg Subcutaneous Q24H  . febuxostat  40 mg Oral Daily  . feeding supplement (ENSURE COMPLETE)  237 mL Oral QPC supper  . furosemide  40 mg Intravenous Daily  . levofloxacin (LEVAQUIN) IV  500 mg Intravenous Q48H  . losartan  50 mg Oral Daily  . metoprolol  50 mg Oral BID  . potassium chloride  20 mEq Oral Daily  . sodium chloride  3 mL Intravenous Q12H   Continuous:   Assessment/Plan: Active Problems:   Volume overload-weight previously 185 pounds now 171 = 15# Wt loss c diuresis.  Acute combined systolic and diastolic congestive heart failure complicated by moderate aortic stenosis with diastolic dysfunction, heart rate controlled - change lasix to PO and 20 mg as we over did this and need to be gentle from here.    CKD (chronic kidney disease) stage 3, GFR 30-59 ml/min-creatinine 1.8 - 2.2. Condom cath on. Higher than baseline.  Adjust and monitor.  If cr stabilizes he can go home.      Anemia no evidence of clinical bleeding, history of hospitalizations with transfusions, will follow, continues on iron supplementation and history of iron infusion    CAD (coronary artery  disease) of artery bypass graft-multiple issues including congestive heart failure   H/O atrial fibrillation - beta blocker therapy   HTN (hypertension)-well-controlled on current medication    CHF (congestive heart failure)-undergoing diuresis but will back off as he is much better.    Cellulitis-can continue on Levaquin alone.  Pneumonia, right lower lobe - levaquin - clinically better  RLE Ulcers/Blisters - local care.  TSH c FT4 and TT# suggest Hypothyroidism. - Will add 25 mcg of Synthroid and slowly titrate.  Home tomorrow.  Will need Home health for LE dressing changes and wound care.   LOS: 3 days   Shakisha Abend M 10/21/2013, 7:15 AM

## 2013-10-21 NOTE — Care Management Note (Signed)
    Page 1 of 1   10/21/2013     1:20:23 PM   CARE MANAGEMENT NOTE 10/21/2013  Patient:  SABEN, DONIGAN   Account Number:  [id number]  Date Initiated:  10/21/2013  Documentation initiated by:  [facility]  Subjective/Objective Assessment:   Admitted with CHF cellulitis.  Was at home with [facility].Marland Kitchen Prior admissions was discharged to SNF.  Lives at home alone.  has daughter.     Action/Plan:   Anticipated DC Date:  10/23/2013   Anticipated DC Plan:  SKILLED NURSING FACILITY  In-house referral  Clinical Social Worker      DC Planning Services  CM consult      Choice offered to / List presented to:             Status of service:  In process, will continue to follow Medicare Important Message given?   (If response is "NO", the following Medicare IM given date fields will be blank) Date Medicare IM given:   Date Additional Medicare IM given:    Discharge Disposition:    Per UR Regulation:  Reviewed for med. necessity/level of care/duration of stay  If discussed at Long Length of Stay Meetings, dates discussed:    Comments:  12-22- 14 1:15pm Avie Arenas, RNBSN [phone] According to patient, lives at home alone.  Has NA and Nurse who follow him at home with [facility].  Walks with walker. last two admission was discharged to SNF.  Would like to have PT/OT eval for discharge planning.  patient does not want to go to SNF for rehab.  Was at [facility] place.  SW consult placed to follow patient also.

## 2013-10-22 LAB — BASIC METABOLIC PANEL
BUN: 56 mg/dL — ABNORMAL HIGH (ref 6–23)
CO2: 30 mEq/L (ref 19–32)
Calcium: 8.9 mg/dL (ref 8.4–10.5)
Calcium: 9.1 mg/dL (ref 8.4–10.5)
Chloride: 105 mEq/L (ref 96–112)
Creatinine, Ser: 2.28 mg/dL — ABNORMAL HIGH (ref 0.50–1.35)
Creatinine, Ser: 2.24 mg/dL — ABNORMAL HIGH (ref 0.50–1.35)
GFR calc Af Amer: 27 mL/min — ABNORMAL LOW (ref >90)
GFR calc Af Amer: 28 mL/min — ABNORMAL LOW (ref >90)
GFR calc non Af Amer: 24 mL/min — ABNORMAL LOW (ref >90)
GFR calc non Af Amer: 24 mL/min — ABNORMAL LOW (ref >90)
Glucose, Bld: 89 mg/dL (ref 70–99)
Glucose, Bld: 182 mg/dL — ABNORMAL HIGH (ref 70–99)
Sodium: 142 mEq/L (ref 135–145)

## 2013-10-22 LAB — CBC
HCT: 28.4 % — ABNORMAL LOW (ref 39.0–52.0)
Hemoglobin: 9.1 g/dL — ABNORMAL LOW (ref 13.0–17.0)
MCH: 30.3 pg (ref 26.0–34.0)
MCHC: 32 g/dL (ref 30.0–36.0)
MCV: 94.7 fL (ref 78.0–100.0)
Platelets: 169 10*3/uL (ref 150–400)
RDW: 15.9 % — ABNORMAL HIGH (ref 11.5–15.5)

## 2013-10-22 MED ORDER — FUROSEMIDE 20 MG PO TABS
20.0000 mg | ORAL_TABLET | Freq: Every day | ORAL | Status: DC
  Administered 2013-10-23: 20 mg via ORAL
  Filled 2013-10-22: qty 1

## 2013-10-22 MED ORDER — LEVOTHYROXINE SODIUM 25 MCG PO TABS: 25.0000 ug | ORAL_TABLET | Freq: Every day | ORAL | Status: DC

## 2013-10-22 MED ORDER — LOSARTAN POTASSIUM 50 MG PO TABS
50.0000 mg | ORAL_TABLET | Freq: Every day | ORAL | Status: DC
  Administered 2013-10-23: 50 mg via ORAL
  Filled 2013-10-22: qty 1

## 2013-10-22 MED ORDER — LEVOFLOXACIN 250 MG PO TABS: 250.0000 mg | ORAL_TABLET | Freq: Every day | ORAL | Status: DC

## 2013-10-22 MED ORDER — PREDNISONE 10 MG PO TABS
10.0000 mg | ORAL_TABLET | Freq: Once | ORAL | Status: AC
  Administered 2013-10-22: 10 mg via ORAL
  Filled 2013-10-22: qty 1

## 2013-10-22 MED ORDER — FEBUXOSTAT 40 MG PO TABS: 40.0000 mg | ORAL_TABLET | Freq: Every day | ORAL | Status: DC

## 2013-10-22 MED ORDER — SODIUM CHLORIDE 0.9 % IV BOLUS (SEPSIS)
250.0000 mL | Freq: Once | INTRAVENOUS | Status: AC
  Administered 2013-10-22: 250 mL via INTRAVENOUS

## 2013-10-22 NOTE — Progress Notes (Addendum)
Subjective: F/Up CHF and PNA. Cr Bumped again c aggressive diuresis and not ready for D/c No new C/O Looking forward to D/c. His feet hurt him yesterday precluding him from walking. Some rectal bleeding yesterday and Lovenox stopped.  Hbg actually better - NTD. Asxatic.   Objective: Vital signs in last 24 hours: Temp:  [97.7 F (36.5 C)-98.8 F (37.1 C)] 98.8 F (37.1 C) (12/23 0416) Pulse Rate:  [79-100] 79 (12/23 0416) Resp:  [18] 18 (12/23 0416) BP: (109-119)/(56-69) 109/57 mmHg (12/23 0416) SpO2:  [94 %-98 %] 94 % (12/23 0416) Weight:  [76.9 kg (169 lb 8.5 oz)] 76.9 kg (169 lb 8.5 oz) (12/23 0420) Weight change: -0.8 kg (-1 lb 12.2 oz) Last BM Date: 10/21/13  CBG (last 3)  No results found for this basename: GLUCAP,  in the last 72 hours  Intake/Output from previous day: 12/22 0701 - 12/23 0700 In: 480 [P.O.:480] Out: 1550 [Urine:1550] Intake/Output this shift:    Elderly chronically ill-appearing male sitting in bed, conversant No oropharyngeal lesions, tongue midline Neck supple, no cervical lymphadenopathy No respiratory distress, no use of accessory muscles, coarse breath sounds with moist cough CV reveals distant heart sounds, regular with some ectopy. SEM. Abdomen-soft, nontender, nondistended, bowel sounds present Extremities reveal wrap on right lower extremity - Removed.  Cellulitis much better.  2 blisters revealed at calf level.  Min Edema for him - smallest legs he has had for yrs.  No issues c l foot.  Lab Results:  Recent Labs  10/21/13 0447 10/22/13 0512  NA 143 144  K 3.6 3.8  CL 106 105  CO2 29 30  GLUCOSE 85 89  BUN 51* 56*  CREATININE 2.15* 2.28*  CALCIUM 8.9 8.9   No results found for this basename: AST, ALT, ALKPHOS, BILITOT, PROT, ALBUMIN,  in the last 72 hours  Recent Labs  10/21/13 0447 10/22/13 0512  WBC 8.4 7.9  HGB 8.6* 9.1*  HCT 26.9* 28.4*  MCV 94.4 94.7  PLT 169 169   No results found for this basename: CKTOTAL,  CKMB, CKMBINDEX, TROPONINI,  in the last 72 hours No results found for this basename: TSH, T4TOTAL, FREET3, T3FREE, THYROIDAB,  in the last 72 hours No results found for this basename: VITAMINB12, FOLATE, FERRITIN, TIBC, IRON, RETICCTPCT,  in the last 72 hours  Studies/Results: No results found.   Medications: Scheduled: . atorvastatin  10 mg Oral q1800  . febuxostat  40 mg Oral Daily  . feeding supplement (ENSURE COMPLETE)  237 mL Oral QPC supper  . furosemide  20 mg Oral Daily  . levofloxacin  250 mg Oral Daily  . levothyroxine  25 mcg Oral QAC breakfast  . losartan  50 mg Oral Daily  . metoprolol  50 mg Oral BID  . sodium chloride  3 mL Intravenous Q12H   Continuous:   Assessment/Plan:    Volume overload-S/P aggressive diuresis and much better but Cr continues to rise.  Weight on admit was 185 pounds and now 169 - down 2 more from yesterday = 16# Wt loss c diuresis. We overdid it a little and i will hold cozaar and lasix today and give back 250 cc fluid.  i will recheck Cr later today at 3pm and decide b/w d/c late today or tomorrow.    Acute combined systolic and diastolic congestive heart failure complicated by moderate aortic stenosis with diastolic dysfunction, heart rate controlled - Lasix 20 mg to be held and restarted tomorrow am - we need to  be gentle from here.    CKD (chronic kidney disease) stage 3, GFR 30-59 ml/min-creatinine 1.8 - 2.28. Condom cath on. Higher cr than baseline.  Adjust and monitor.  If cr stabilizes he can go home.      Anemia no evidence of clinical bleeding, history of hospitalizations with transfusions, will follow, continues on iron supplementation and history of iron infusion.  Hbg a little better this am.  Monitor    CAD (coronary artery disease) of artery bypass graft-multiple issues including congestive heart failure    Atrial fibrillation on Tele - beta blocker therapy.  Not a coumadin candidate   HTN (hypertension)-well-controlled on  current medication   Cellulitis-Much improved - Much of orignal issue may have been severe venous stasis as well. can continue on Levaquin alone.  Pneumonia, right lower lobe - levaquin - clinically better  RLE Ulcers/Blisters - local care as being provided. Apply piece of Aquacel to right anterior and posterior leg wounds Q day.  Cover with acewrap form behind toes to mid calf.  Moisten with NS to remove previous dressing.  TSH c FT4 and TT3 suggest Hypothyroidism. - Continue 25 mcg of Synthroid and slowly titrate.  Home today or tomorrow.  Will need Home health for LE dressing changes and wound care. CW saw pt yesterday and stated - According to patient, lives at home alone. Has NA and Nurse who follow him at home with [facility]. Walks with walker. last two admission was discharged to SNF. Would like to have PT/OT eval for discharge planning. patient does not want to go to SNF for rehab. Was at [facility] place. SW consult placed to follow patient also.  1 dose prednisone 10 mg for foot pain - ? Gout ?   LOS: 4 days   Suzzette Gasparro M 10/22/2013, 7:32 AM

## 2013-10-22 NOTE — Progress Notes (Signed)
Physical Therapy Treatment Patient Details Name: [name] [name] MRN: [id number] DOB: 19-Oct-1921 Today's Date: 10/22/2013 Time: 9563-8756 PT Time Calculation (min): 24 min  PT Assessment / Plan / Recommendation  History of Present Illness Pt is a 79 y/o male admitted with CHF and cellulitis of LE's. Pt evaled and d/c'd from PT services, and PT was reordered this date due to increasing bilateral foot pain, which is restricting his ambulation.   PT Comments   This patient presents with acute pain and decreased functional independence following an exacerbation of CHF and cellulitis. At the time of PT eval, pt had some foot pain - mainly on the R - however was able to ambulate rather well with his shoes on. Pt reports he absolutely does not want to return to SNF, and I think pt could progress to a safe d/c home with HHPT to follow, if he had the appropriate amount of assist available. He states that he does not have 24 hour assist, but would be safe to be left for short periods of time if he was waiting on one daughter to arrive after the other has left, for example.    Follow Up Recommendations  Home health PT     Does the patient have the potential to tolerate intense rehabilitation     Barriers to Discharge Decreased caregiver support Pt reports he does not have 24 hour assist at home but has 2 daughters in the area he will talk with regarding assist at home    Equipment Recommendations  3in1 (PT)    Recommendations for Other Services    Frequency Min 3X/week   Progress towards PT Goals    Plan      Precautions / Restrictions Precautions Precautions: Fall Restrictions Weight Bearing Restrictions: No   Pertinent Vitals/Pain Pt reports minimal pain in R foot during ambulation    Mobility  Bed Mobility Bed Mobility: Not assessed Details for Bed Mobility Assistance: Pt received up in chair. Transfers Transfers: Sit to Stand;Stand to Sit Sit to Stand: 2: Max assist;4: Min  assist Stand to Sit: 4: Min assist;4: Min guard Details for Transfer Assistance: First attempt at sit>stand, pt required max assist due to balance disturbances and inability to come to full upright position. Upon second attempt (with tennis shoes on) pt was able to come to full stand with min assist and use of armrests of chair.  Ambulation/Gait Ambulation/Gait Assistance: 4: Min guard Ambulation Distance (Feet): 90 Feet Assistive device: Rolling walker Ambulation/Gait Assistance Details: VC's for improved posture, but no physical assist was required. 1 standing rest break taken for SOB, however pt recovered quickly and was able to make it back to his room for seated rest break without difficulty.  Gait Pattern: Step-through pattern;Decreased stride length Gait velocity: decr Stairs: No Wheelchair Mobility Wheelchair Mobility: No    Exercises     PT Diagnosis: Difficulty walking  PT Problem List: Decreased strength;Decreased range of motion;Decreased activity tolerance;Decreased balance;Decreased mobility;Decreased safety awareness;Decreased knowledge of use of DME;Pain PT Treatment Interventions: DME instruction;Gait training;Stair training;Functional mobility training;Therapeutic activities;Therapeutic exercise;Neuromuscular re-education;Patient/family education   PT Goals (current goals can now be found in the care plan section) Acute Rehab PT Goals Patient Stated Goal: To return home and not go back to a nursing home PT Goal Formulation: With patient Time For Goal Achievement: 10/29/13 Potential to Achieve Goals: Good  Visit Information  Last PT Received On: 10/22/13 Assistance Needed: +1 History of Present Illness: Pt is a 79 y/o male admitted with  CHF and cellulitis of LE's. Pt evaled and d/c'd from PT services, and PT was reordered this date due to increasing bilateral foot pain, which is restricting his ambulation.    Subjective Data  Patient Stated Goal: To return home and  not go back to a nursing home   Cognition  Cognition Arousal/Alertness: Awake/alert Behavior During Therapy: WFL for tasks assessed/performed Overall Cognitive Status: Within Functional Limits for tasks assessed    Balance  Balance Balance Assessed: Yes Static Sitting Balance Static Sitting - Balance Support: Feet supported;Bilateral upper extremity supported Static Sitting - Level of Assistance: 5: Stand by assistance Static Standing Balance Static Standing - Balance Support: Bilateral upper extremity supported Static Standing - Level of Assistance: 4: Min assist  End of Session PT - End of Session Equipment Utilized During Treatment: Gait belt Activity Tolerance: Patient tolerated treatment well Patient left: in chair;with call bell/phone within reach Nurse Communication: Mobility status   GP     Ruthann Cancer 10/22/2013, 3:34 PM  Ruthann Cancer, PT, DPT [phone]

## 2013-10-22 NOTE — Progress Notes (Signed)
Clinical Social Work Department BRIEF PSYCHOSOCIAL ASSESSMENT 10/22/2013  Patient:  [name] [name], [name] [name]     Account Number:  [id number]     Admit date:  10/18/2013  Clinical Social Worker:  Carren Rang  Date/Time:  10/22/2013 01:27 PM  Referred by:  RN  Date Referred:  10/22/2013 Referred for  SNF Placement   Other Referral:   Interview type:  Patient Other interview type:   CSW spoke to patient, with famiy by bedside    PSYCHOSOCIAL DATA Living Status:  ALONE Admitted from facility:   Level of care:   Primary support name:  Elzie Rings Primary support relationship to patient:  CHILD, ADULT Degree of support available:   Good    CURRENT CONCERNS Current Concerns  Post-Acute Placement   Other Concerns:    SOCIAL WORK ASSESSMENT / PLAN CSW received referral from RN, that patient has discharges orders to go home, but wants social worker to try and talk about SNF with patient. CSW went into room and introduced self to patient and family member, by bedside. CSW explained the SNF process to patient and patient explained he does not want to go to SNF, he has been at one before and does not want to go back. CSW encouraged patient to think about it and patient stated, he would only go if he was "forced" to go. Patient explained to social worker that he does not want to go to SNF and has support from family. CSW signing off at this time. Please re consult if patient needs to go to SNF.   Assessment/plan status:  No Further Intervention Required Other assessment/ plan:   Information/referral to community resources:   snf information    PATIENT'S/FAMILY'S RESPONSE TO PLAN OF CARE: Patient states he wants to go home with home health.        Maree Krabbe, MSW, Theresia Majors [phone]

## 2013-10-22 NOTE — Progress Notes (Signed)
Occupational Therapy Evaluation Patient Details Name: [name] [name] MRN: [id number] DOB: August 24, 1921 Today's Date: 10/22/2013 Time: 4098-1191 OT Time Calculation (min): 23 min  OT Assessment / Plan / Recommendation History of present illness Pt is a 79 y/o male admitted with CHF and cellulitis of LE's.   Clinical Impression   PTA, pt lived at home alone and was independent with ADL and mod I with mobility. Pt does not want to go back to SNF. Feel pt can go home with intermittent S of daughters, but would benefit from [facility], HHPT, HH nursing and [facility] aide. Will follow acutely to further assess need for AE and establish HEP with theraband.     OT Assessment  Patient needs continued OT Services    Follow Up Recommendations  Home health OT;Other (comment) (HH aide)    Barriers to Discharge      Equipment Recommendations  None recommended by OT    Recommendations for Other Services    Frequency  Min 2X/week    Precautions / Restrictions Precautions Precautions: Fall Restrictions Weight Bearing Restrictions: No   Pertinent Vitals/Pain no apparent distress     ADL  Eating/Feeding: Modified independent Where Assessed - Eating/Feeding: Chair Grooming: Modified independent Where Assessed - Grooming: Unsupported standing Upper Body Bathing: Set up;Supervision/safety Where Assessed - Upper Body Bathing: Unsupported sitting Lower Body Bathing: Min guard;Supervision/safety Where Assessed - Lower Body Bathing: Supported sit to stand Upper Body Dressing: Set up;Supervision/safety Where Assessed - Upper Body Dressing: Unsupported sitting Lower Body Dressing: Min guard Where Assessed - Lower Body Dressing: Supported sit to Pharmacist, hospital: Hydrographic surveyor Method: Sit to Barista: Materials engineer and Hygiene: Modified independent Where Assessed - Engineer, mining and Hygiene: Sit to stand from 3-in-1  or toilet Equipment Used: Gait belt;Rolling walker Transfers/Ambulation Related to ADLs: min guard ADL Comments: fatigues easily    OT Diagnosis: Generalized weakness  OT Problem List: Decreased strength;Decreased activity tolerance;Decreased knowledge of use of DME or AE;Decreased safety awareness OT Treatment Interventions: Self-care/ADL training;Therapeutic exercise;Energy conservation;DME and/or AE instruction;Therapeutic activities;Patient/family education   OT Goals(Current goals can be found in the care plan section) Acute Rehab OT Goals Patient Stated Goal: To return home and not go back to a nursing home OT Goal Formulation: With patient Time For Goal Achievement: 11/05/13 Potential to Achieve Goals: Good  Visit Information  Last OT Received On: 10/22/13 Assistance Needed: +1 History of Present Illness: Pt is a 79 y/o male admitted with CHF and cellulitis of LE's. Pt evaled and d/c'd from PT services, and PT was reordered this date due to increasing bilateral foot pain, which is restricting his ambulation.       Prior Functioning     Home Living Family/patient expects to be discharged to:: Private residence Living Arrangements: Alone Available Help at Discharge: Family;Available PRN/intermittently Type of Home: House Home Access: Ramped entrance Home Layout: Two level;Able to live on main level with bedroom/bathroom Home Equipment: Gilmer Mor - single point;Walker - 2 wheels Additional Comments: left SNF in ?Sept and has been managing at home with assist of daughters Prior Function Level of Independence: Independent with assistive device(s) Comments: uses RW due to unsteadiness Communication Communication: HOH Dominant Hand: Right         Luisa Dago, OTR/L  [phone] 12/23/2014Vision/Perception     Cognition  Cognition Arousal/Alertness: Awake/alert Behavior During Therapy: WFL for tasks assessed/performed Overall Cognitive Status: Within Functional Limits  for tasks assessed    Extremity/Trunk  Assessment Upper Extremity Assessment Upper Extremity Assessment: Generalized weakness Lower Extremity Assessment Lower Extremity Assessment: Generalized weakness Cervical / Trunk Assessment Cervical / Trunk Assessment: Kyphotic     Mobility Bed Mobility Bed Mobility: Not assessed Details for Bed Mobility Assistance: Pt received up in chair. Transfers Transfers: Sit to Stand;Stand to Sit Sit to Stand: 4: Min guard;From chair/3-in-1 Stand to Sit: 4: Min assist;4: Min guard Details for Transfer Assistance: unsteady     Exercise     Balance Balance Balance Assessed: Yes Static Sitting Balance Static Sitting - Balance Support: Feet supported;Bilateral upper extremity supported Static Sitting - Level of Assistance: 5: Stand by assistance Static Standing Balance Static Standing - Balance Support: Bilateral upper extremity supported Static Standing - Level of Assistance: 5: Stand by assistance   End of Session OT - End of Session Equipment Utilized During Treatment: Gait belt;Rolling walker Activity Tolerance: Patient tolerated treatment well Patient left: in chair;with call bell/phone within reach Nurse Communication: Mobility status  GO     Voris Tigert,HILLARY 10/22/2013, 5:27 PM

## 2013-10-23 LAB — BASIC METABOLIC PANEL
CO2: 29 mEq/L (ref 19–32)
Calcium: 9 mg/dL (ref 8.4–10.5)
Chloride: 107 mEq/L (ref 96–112)
Creatinine, Ser: 2.11 mg/dL — ABNORMAL HIGH (ref 0.50–1.35)
GFR calc Af Amer: 30 mL/min — ABNORMAL LOW (ref >90)
Potassium: 4 mEq/L (ref 3.5–5.1)
Sodium: 145 mEq/L (ref 135–145)

## 2013-10-23 LAB — CBC
Hemoglobin: 9.1 g/dL — ABNORMAL LOW (ref 13.0–17.0)
MCH: 30.3 pg (ref 26.0–34.0)
MCV: 94.7 fL (ref 78.0–100.0)
Platelets: 180 10*3/uL (ref 150–400)
RBC: 3 MIL/uL — ABNORMAL LOW (ref 4.22–5.81)
RDW: 15.6 % — ABNORMAL HIGH (ref 11.5–15.5)
WBC: 8.4 10*3/uL (ref 4.0–10.5)

## 2013-10-23 MED ORDER — LEVOFLOXACIN 250 MG PO TABS
250.0000 mg | ORAL_TABLET | Freq: Every day | ORAL | Status: AC
  Administered 2013-10-23: 250 mg via ORAL
  Filled 2013-10-23: qty 1

## 2013-10-23 MED ORDER — LEVOTHYROXINE SODIUM 25 MCG PO TABS: 25.0000 ug | ORAL_TABLET | Freq: Every day | ORAL | Status: DC

## 2013-10-23 MED ORDER — LEVOFLOXACIN 250 MG PO TABS: ORAL_TABLET | ORAL | Status: DC

## 2013-10-23 NOTE — Discharge Summary (Signed)
Physician Discharge Summary    [name] [name]  MR#: [id number]  DOB:03/02/1921  Date of Admission: 10/18/2013 Date of Discharge: 10/23/2013  Attending Physician:[name] [name]  [name],[name], MD   Discharge Diagnoses: Active Problems:   Volume overload   Acute combined systolic and diastolic congestive heart failure   CKD (chronic kidney disease) stage 3, GFR 30-59 ml/min   Anemia   CAD (coronary artery disease) of artery bypass graft   HTN (hypertension)   CHF (congestive heart failure)   Cellulitis   Discharge Medications:   Medication List    STOP taking these medications       allopurinol 100 MG tablet  Commonly known as:  ZYLOPRIM      TAKE these medications       acetaminophen 325 MG tablet  Commonly known as:  TYLENOL  Take 2 tablets (650 mg total) by mouth every 6 (six) hours as needed.     atorvastatin 10 MG tablet  Commonly known as:  LIPITOR  Take 10 mg by mouth daily.     calcium carbonate 500 MG chewable tablet  Commonly known as:  TUMS - dosed in mg elemental calcium  Chew 1 tablet by mouth as needed.     febuxostat 40 MG tablet  Commonly known as:  ULORIC  Take 1 tablet (40 mg total) by mouth daily.     furosemide 20 MG tablet  Commonly known as:  LASIX  Take 20 mg by mouth daily.     levofloxacin 250 MG tablet  Commonly known as:  LEVAQUIN  Take 1 tablet at noon on 12/25 then stop     levothyroxine 25 MCG tablet  Commonly known as:  SYNTHROID, LEVOTHROID  Take 1 tablet (25 mcg total) by mouth daily before breakfast.     losartan 100 MG tablet  Commonly known as:  COZAAR  Take 50 mg by mouth daily.     metoprolol 50 MG tablet  Commonly known as:  LOPRESSOR  Take 50 mg by mouth 2 (two) times daily.        Hospital Procedures: Dg Chest 2 View  10/19/2013   CLINICAL DATA:  Cough and heart failure  EXAM: CHEST  2 VIEW  COMPARISON:  05/17/2013  FINDINGS: Stable cardiac enlargement. Previous CABG. Chronic streaky  opacity at the left base, scarring based on previous CT imaging 05/23/2013. There is an asymmetric airspace type opacity in the right lung. No significant pleural effusion. No peumothorax.  IMPRESSION: 1. Asymmetric opacity in the right lung, primarily concerning for pneumonia or pneumonitis. 2. Chronic scarring at the left base   Electronically Signed   By: Tiburcio Pea M.D.   On: 10/19/2013 22:20    History of Present Illness: Admitted for increasing weight and found to be in acute on chronic systolic and diastolic CHF exacerbation   Hospital Course: Volume overload-S/P aggressive diuresis . Weight on admit was 185 [name] and  168 prior to discharge.  cozaar and lasix held day prior to discharge given mild AKI from overdiuresis, but will be resumed on discharge w/ f/u in 7 days w/ Dr [name] [name]. Lasix will be resumed at 20mg  daily. Cr steadily improving x 36 hours prior to discharge.  CKD (chronic kidney disease) stage 3, GFR 30-59 w/ baseline cr at 1.8-2.1. Pt Cr was 2.1 at discharge and steadily improving. Repeat Bmet in 7 days. Lasix 20mg  daily resumed.   Anemia - stable 8-9s w/o need for transfusion for > 48 hours prior to d/c  CAD (coronary artery disease) of artery bypass graft-multiple issues including congestive heart failure  Atrial fibrillation on Tele - beta blocker therapy. Not a coumadin candidate   HTN (hypertension)-well-controlled on current medication   Pneumonia, right lower lobe - levaquin - clinically better. Will complete 5 days of levaquin on 12/25. Rx sent. AF and no increased WBC at d/c   RLE Ulcers/Blisters - local care as being provided. Apply piece of Aquacel to right anterior and posterior leg wounds Q day. Cover with acewrap form behind toes to mid calf. Moisten with NS to remove previous dressing. HH SN has been ordered   TSH c FT4 and TT3 suggest Hypothyroidism. - Continue 25 mcg of Synthroid and slowly titrate. Follow as outpatient. Rx sent for the daily    Ankle pain - this was unlikely gout and prednisone will be stopped. On urloric. On questioning the patient states the pain was from when they removed the bandage on his ulcer    Day of Discharge Exam BP 118/72  Pulse 75  Temp(Src) 97.4 F (36.3 C) (Oral)  Resp 18  Ht 5\' 9"  (1.753 m)  Wt 168 lb 14 oz (76.6 kg)  BMI 24.93 kg/m2  SpO2 96%  Physical Exam: General appearance: WM in NAD  Eyes: no scleral icterus Throat: oropharynx moist without erythema Resp: no crackles or rales. Good air movement throughout Cardio: RRR, SEM present GI: soft, non-tender; bowel sounds normal; no masses,  no organomegaly Extremities:ACE bandage on L foot   Discharge Labs:  Recent Labs  10/22/13 1945 10/23/13 0438  NA 142 145  K 4.4 4.0  CL 103 107  CO2 27 29  GLUCOSE 182* 99  BUN 59* 56*  CREATININE 2.24* 2.11*  CALCIUM 9.1 9.0   No results found for this basename: AST, ALT, ALKPHOS, BILITOT, PROT, ALBUMIN,  in the last 72 hours  Recent Labs  10/22/13 0512 10/23/13 0438  WBC 7.9 8.4  HGB 9.1* 9.1*  HCT 28.4* 28.4*  MCV 94.7 94.7  PLT 169 180   Lab Results  Component Value Date   INR 1.15 04/16/2013   No results found for this basename: CKTOTAL, CKMB, CKMBINDEX, TROPONINI,  in the last 72 hours No results found for this basename: TSH, T4TOTAL, FREET3, T3FREE, THYROIDAB,  in the last 72 hours No results found for this basename: VITAMINB12, FOLATE, FERRITIN, TIBC, IRON, RETICCTPCT,  in the last 72 hours  Discharge instructions:     Discharge Orders   Future Orders Complete By Expires   Diet - low sodium heart healthy  As directed    Increase activity slowly  As directed      03-Skilled Nursing Facility Follow-up Information   Follow up with [name] Pounds, MD.   Specialty:  Internal Medicine   Contact information:   [phone] [facility] [place] [address] [phone]       Disposition: home w/ HH   Follow-up Appts: Follow-up  with Dr. Timothy [name] at [facility] in 1-2 weeks.  Call for appointment.  Condition on Discharge: stable  Tests Needing Follow-up:BMet  Time spent in discharge (includes decision making & examination of pt): 45 minutes    Signed: Jennet Scroggin 10/23/2013, 9:59 AM

## 2013-10-23 NOTE — Progress Notes (Signed)
Discharged to home with family office visits in place teaching done  

## 2013-10-23 NOTE — Progress Notes (Signed)
Occupational Therapy Treatment Patient Details Name: [name] [name] MRN: [id number] DOB: 12-Dec-1920 Today's Date: 10/23/2013 Time: 7829-5621 OT Time Calculation (min): 27 min  OT Assessment / Plan / Recommendation  History of present illness Pt is a 79 y/o male admitted with CHF and cellulitis of LE's. Pt evaled and d/c'd from PT services, and PT was reordered this date due to increasing bilateral foot pain, which is restricting his ambulation.   OT comments  Discussed D/C concerns with nsg. Asked nsg to discuss need for as much S as possible. Pt will benefit from home health aid. Pt to [facility] to [facility]  Follow Up Recommendations  Home health OT;Other (comment)    Barriers to Discharge       Equipment Recommendations  None recommended by OT    Recommendations for Other Services    Frequency Min 2X/week   Progress towards OT Goals    Plan Discharge plan remains appropriate    Precautions / Restrictions Precautions Precautions: Fall Restrictions Weight Bearing Restrictions: No   Pertinent Vitals/Pain no apparent distress     ADL  Transfers/Ambulation Related to ADLs: S ADL Comments: Participated in ADL @ set up level. Pt incontinent of BM during session. Pt unaware. nsg notified.     OT Diagnosis:    OT Problem List:   OT Treatment Interventions:     OT Goals(current goals can now be found in the care plan section)    Visit Information  Last OT Received On: 10/23/13 Assistance Needed: +1 History of Present Illness: Pt is a 79 y/o male admitted with CHF and cellulitis of LE's. Pt evaled and d/c'd from PT services, and PT was reordered this date due to increasing bilateral foot pain, which is restricting his ambulation.    Subjective Data      Prior Functioning       Cognition  Cognition Arousal/Alertness: Awake/alert Behavior During Therapy: WFL for tasks assessed/performed Overall Cognitive Status: Within Functional Limits for tasks assessed    Mobility  Bed Mobility Bed Mobility: Supine to Sit Supine to Sit: 6: Modified independent (Device/Increase time) Transfers Transfers: Sit to Stand;Stand to Sit Sit to Stand: 5: Supervision Stand to Sit: 5: Supervision    Exercises  Other Exercises Other Exercises: Pt educated on level 1 theraband ex to complete at home   Balance     End of Session OT - End of Session Equipment Utilized During Treatment: Rolling walker Activity Tolerance: Patient tolerated treatment well Patient left: Other (comment) (on [facility]) Nurse Communication: Other (comment);Mobility status (pt on BSC with call bell)  GO     Emmajo Bennette,HILLARY 10/23/2013, 11:07 AM Luisa Dago, OTR/L  [phone] 10/23/2013

## 2014-03-20 ENCOUNTER — Other Ambulatory Visit (HOSPITAL_COMMUNITY): Payer: Self-pay | Admitting: *Deleted

## 2014-03-20 ENCOUNTER — Encounter (HOSPITAL_COMMUNITY)
Admission: RE | Admit: 2014-03-20 | Discharge: 2014-03-20 | Disposition: A | Discharge status: Home / Self Care | Payer: PRIVATE HEALTH INSURANCE | Source: Ambulatory Visit | Attending: Internal Medicine | Admitting: Internal Medicine

## 2014-03-20 DIAGNOSIS — D649 Anemia, unspecified: Secondary | ICD-10-CM | POA: Insufficient documentation

## 2014-03-20 LAB — ABO/RH: ABO/RH(D): O NEG

## 2014-03-20 LAB — PREPARE RBC (CROSSMATCH)

## 2014-03-21 ENCOUNTER — Encounter (HOSPITAL_COMMUNITY)
Admission: RE | Admit: 2014-03-21 | Discharge: 2014-03-21 | Disposition: A | Discharge status: Home / Self Care | Payer: PRIVATE HEALTH INSURANCE | Source: Ambulatory Visit | Attending: Internal Medicine | Admitting: Internal Medicine

## 2014-03-21 DIAGNOSIS — D649 Anemia, unspecified: Secondary | ICD-10-CM | POA: Diagnosis not present

## 2014-03-21 MED ORDER — FUROSEMIDE 10 MG/ML IJ SOLN
60.0000 mg | Freq: Once | INTRAMUSCULAR | Status: AC
  Administered 2014-03-21: 14:00:00 60 mg via INTRAVENOUS
  Filled 2014-03-21: qty 6

## 2014-03-22 LAB — TYPE AND SCREEN
ABO/RH(D): O NEG
Antibody Screen: NEGATIVE
UNIT DIVISION: 0
Unit division: 0

## 2014-04-15 ENCOUNTER — Other Ambulatory Visit (HOSPITAL_COMMUNITY): Payer: Self-pay | Admitting: *Deleted

## 2014-04-16 ENCOUNTER — Encounter (HOSPITAL_COMMUNITY)
Admission: RE | Admit: 2014-04-16 | Discharge: 2014-04-16 | Disposition: A | Discharge status: Home / Self Care | Payer: PRIVATE HEALTH INSURANCE | Source: Ambulatory Visit | Attending: Internal Medicine | Admitting: Internal Medicine

## 2014-04-16 DIAGNOSIS — D649 Anemia, unspecified: Secondary | ICD-10-CM | POA: Diagnosis present

## 2014-04-16 LAB — PREPARE RBC (CROSSMATCH)

## 2014-04-17 ENCOUNTER — Encounter (HOSPITAL_COMMUNITY)
Admission: RE | Admit: 2014-04-17 | Discharge: 2014-04-17 | Disposition: A | Discharge status: Home / Self Care | Payer: PRIVATE HEALTH INSURANCE | Source: Ambulatory Visit | Attending: Internal Medicine | Admitting: Internal Medicine

## 2014-04-17 DIAGNOSIS — D649 Anemia, unspecified: Secondary | ICD-10-CM | POA: Diagnosis not present

## 2014-04-17 MED ORDER — FUROSEMIDE 10 MG/ML IJ SOLN
60.0000 mg | Freq: Once | INTRAMUSCULAR | Status: AC
Start: 2014-04-17 — End: 2014-04-17
  Administered 2014-04-17: 60 mg via INTRAVENOUS
  Filled 2014-04-17: qty 8

## 2014-04-18 LAB — TYPE AND SCREEN
ABO/RH(D): O NEG
ANTIBODY SCREEN: NEGATIVE
UNIT DIVISION: 0
Unit division: 0

## 2014-12-08 ENCOUNTER — Inpatient Hospital Stay (HOSPITAL_COMMUNITY)
Admission: EM | Admit: 2014-12-08 | Discharge: 2014-12-22 | DRG: 469 | Disposition: A | Discharge status: Skilled Nursing Facility | Payer: Medicare Other | Attending: Internal Medicine | Admitting: Internal Medicine

## 2014-12-08 ENCOUNTER — Encounter (HOSPITAL_COMMUNITY): Payer: Self-pay | Admitting: *Deleted

## 2014-12-08 ENCOUNTER — Emergency Department (HOSPITAL_COMMUNITY): Payer: Medicare Other

## 2014-12-08 DIAGNOSIS — B9620 Unspecified Escherichia coli [E. coli] as the cause of diseases classified elsewhere: Secondary | ICD-10-CM | POA: Diagnosis present

## 2014-12-08 DIAGNOSIS — I350 Nonrheumatic aortic (valve) stenosis: Secondary | ICD-10-CM

## 2014-12-08 DIAGNOSIS — N471 Phimosis: Secondary | ICD-10-CM | POA: Diagnosis present

## 2014-12-08 DIAGNOSIS — N183 Chronic kidney disease, stage 3 unspecified: Secondary | ICD-10-CM | POA: Diagnosis present

## 2014-12-08 DIAGNOSIS — E039 Hypothyroidism, unspecified: Secondary | ICD-10-CM | POA: Diagnosis present

## 2014-12-08 DIAGNOSIS — J9601 Acute respiratory failure with hypoxia: Secondary | ICD-10-CM | POA: Diagnosis not present

## 2014-12-08 DIAGNOSIS — J9621 Acute and chronic respiratory failure with hypoxia: Secondary | ICD-10-CM | POA: Diagnosis present

## 2014-12-08 DIAGNOSIS — Z880 Allergy status to penicillin: Secondary | ICD-10-CM | POA: Diagnosis not present

## 2014-12-08 DIAGNOSIS — D649 Anemia, unspecified: Secondary | ICD-10-CM | POA: Diagnosis present

## 2014-12-08 DIAGNOSIS — I480 Paroxysmal atrial fibrillation: Secondary | ICD-10-CM | POA: Diagnosis not present

## 2014-12-08 DIAGNOSIS — Z8546 Personal history of malignant neoplasm of prostate: Secondary | ICD-10-CM | POA: Diagnosis not present

## 2014-12-08 DIAGNOSIS — I248 Other forms of acute ischemic heart disease: Secondary | ICD-10-CM | POA: Diagnosis not present

## 2014-12-08 DIAGNOSIS — G9340 Encephalopathy, unspecified: Secondary | ICD-10-CM | POA: Diagnosis not present

## 2014-12-08 DIAGNOSIS — S72002D Fracture of unspecified part of neck of left femur, subsequent encounter for closed fracture with routine healing: Secondary | ICD-10-CM | POA: Diagnosis not present

## 2014-12-08 DIAGNOSIS — I129 Hypertensive chronic kidney disease with stage 1 through stage 4 chronic kidney disease, or unspecified chronic kidney disease: Secondary | ICD-10-CM | POA: Diagnosis present

## 2014-12-08 DIAGNOSIS — R32 Unspecified urinary incontinence: Secondary | ICD-10-CM | POA: Diagnosis present

## 2014-12-08 DIAGNOSIS — Z9981 Dependence on supplemental oxygen: Secondary | ICD-10-CM | POA: Diagnosis not present

## 2014-12-08 DIAGNOSIS — J9600 Acute respiratory failure, unspecified whether with hypoxia or hypercapnia: Secondary | ICD-10-CM | POA: Diagnosis not present

## 2014-12-08 DIAGNOSIS — I482 Chronic atrial fibrillation: Secondary | ICD-10-CM | POA: Diagnosis present

## 2014-12-08 DIAGNOSIS — N179 Acute kidney failure, unspecified: Secondary | ICD-10-CM | POA: Insufficient documentation

## 2014-12-08 DIAGNOSIS — E038 Other specified hypothyroidism: Secondary | ICD-10-CM | POA: Insufficient documentation

## 2014-12-08 DIAGNOSIS — S72012A Unspecified intracapsular fracture of left femur, initial encounter for closed fracture: Principal | ICD-10-CM | POA: Diagnosis present

## 2014-12-08 DIAGNOSIS — E871 Hypo-osmolality and hyponatremia: Secondary | ICD-10-CM | POA: Diagnosis not present

## 2014-12-08 DIAGNOSIS — S72002A Fracture of unspecified part of neck of left femur, initial encounter for closed fracture: Secondary | ICD-10-CM

## 2014-12-08 DIAGNOSIS — D696 Thrombocytopenia, unspecified: Secondary | ICD-10-CM | POA: Diagnosis not present

## 2014-12-08 DIAGNOSIS — Z923 Personal history of irradiation: Secondary | ICD-10-CM | POA: Diagnosis not present

## 2014-12-08 DIAGNOSIS — Z951 Presence of aortocoronary bypass graft: Secondary | ICD-10-CM

## 2014-12-08 DIAGNOSIS — Z66 Do not resuscitate: Secondary | ICD-10-CM | POA: Diagnosis not present

## 2014-12-08 DIAGNOSIS — E870 Hyperosmolality and hypernatremia: Secondary | ICD-10-CM | POA: Diagnosis not present

## 2014-12-08 DIAGNOSIS — I9581 Postprocedural hypotension: Secondary | ICD-10-CM | POA: Diagnosis not present

## 2014-12-08 DIAGNOSIS — Z9119 Patient's noncompliance with other medical treatment and regimen: Secondary | ICD-10-CM | POA: Diagnosis present

## 2014-12-08 DIAGNOSIS — I272 Other secondary pulmonary hypertension: Secondary | ICD-10-CM | POA: Diagnosis present

## 2014-12-08 DIAGNOSIS — Z96642 Presence of left artificial hip joint: Secondary | ICD-10-CM

## 2014-12-08 DIAGNOSIS — N189 Chronic kidney disease, unspecified: Secondary | ICD-10-CM | POA: Diagnosis not present

## 2014-12-08 DIAGNOSIS — E876 Hypokalemia: Secondary | ICD-10-CM | POA: Diagnosis not present

## 2014-12-08 DIAGNOSIS — I10 Essential (primary) hypertension: Secondary | ICD-10-CM | POA: Diagnosis not present

## 2014-12-08 DIAGNOSIS — I25810 Atherosclerosis of coronary artery bypass graft(s) without angina pectoris: Secondary | ICD-10-CM | POA: Diagnosis not present

## 2014-12-08 DIAGNOSIS — J811 Chronic pulmonary edema: Secondary | ICD-10-CM

## 2014-12-08 DIAGNOSIS — D62 Acute posthemorrhagic anemia: Secondary | ICD-10-CM | POA: Diagnosis not present

## 2014-12-08 DIAGNOSIS — E785 Hyperlipidemia, unspecified: Secondary | ICD-10-CM | POA: Diagnosis present

## 2014-12-08 DIAGNOSIS — J810 Acute pulmonary edema: Secondary | ICD-10-CM | POA: Insufficient documentation

## 2014-12-08 DIAGNOSIS — I2721 Secondary pulmonary arterial hypertension: Secondary | ICD-10-CM | POA: Diagnosis present

## 2014-12-08 DIAGNOSIS — R1312 Dysphagia, oropharyngeal phase: Secondary | ICD-10-CM | POA: Diagnosis not present

## 2014-12-08 DIAGNOSIS — Z01810 Encounter for preprocedural cardiovascular examination: Secondary | ICD-10-CM | POA: Diagnosis present

## 2014-12-08 DIAGNOSIS — I5033 Acute on chronic diastolic (congestive) heart failure: Secondary | ICD-10-CM | POA: Diagnosis not present

## 2014-12-08 DIAGNOSIS — E872 Acidosis: Secondary | ICD-10-CM | POA: Diagnosis not present

## 2014-12-08 DIAGNOSIS — N390 Urinary tract infection, site not specified: Secondary | ICD-10-CM | POA: Diagnosis present

## 2014-12-08 DIAGNOSIS — W1839XA Other fall on same level, initial encounter: Secondary | ICD-10-CM | POA: Diagnosis present

## 2014-12-08 DIAGNOSIS — W19XXXA Unspecified fall, initial encounter: Secondary | ICD-10-CM

## 2014-12-08 DIAGNOSIS — I2510 Atherosclerotic heart disease of native coronary artery without angina pectoris: Secondary | ICD-10-CM | POA: Diagnosis present

## 2014-12-08 DIAGNOSIS — Z87891 Personal history of nicotine dependence: Secondary | ICD-10-CM

## 2014-12-08 DIAGNOSIS — I4891 Unspecified atrial fibrillation: Secondary | ICD-10-CM | POA: Insufficient documentation

## 2014-12-08 DIAGNOSIS — J9690 Respiratory failure, unspecified, unspecified whether with hypoxia or hypercapnia: Secondary | ICD-10-CM

## 2014-12-08 DIAGNOSIS — I25708 Atherosclerosis of coronary artery bypass graft(s), unspecified, with other forms of angina pectoris: Secondary | ICD-10-CM | POA: Diagnosis not present

## 2014-12-08 DIAGNOSIS — I5031 Acute diastolic (congestive) heart failure: Secondary | ICD-10-CM

## 2014-12-08 DIAGNOSIS — S72009A Fracture of unspecified part of neck of unspecified femur, initial encounter for closed fracture: Secondary | ICD-10-CM | POA: Diagnosis present

## 2014-12-08 DIAGNOSIS — I509 Heart failure, unspecified: Secondary | ICD-10-CM

## 2014-12-08 DIAGNOSIS — Z978 Presence of other specified devices: Secondary | ICD-10-CM

## 2014-12-08 LAB — CBC WITH DIFFERENTIAL/PLATELET
BASOS PCT: 0 % (ref 0–1)
Basophils Absolute: 0 10*3/uL (ref 0.0–0.1)
EOS ABS: 0 10*3/uL (ref 0.0–0.7)
EOS PCT: 0 % (ref 0–5)
HCT: 28.1 % — ABNORMAL LOW (ref 39.0–52.0)
Hemoglobin: 8.7 g/dL — ABNORMAL LOW (ref 13.0–17.0)
Lymphocytes Relative: 7 % — ABNORMAL LOW (ref 12–46)
Lymphs Abs: 0.6 10*3/uL — ABNORMAL LOW (ref 0.7–4.0)
MCH: 27.8 pg (ref 26.0–34.0)
MCHC: 31 g/dL (ref 30.0–36.0)
MCV: 89.8 fL (ref 78.0–100.0)
Monocytes Absolute: 0.4 10*3/uL (ref 0.1–1.0)
Monocytes Relative: 4 % (ref 3–12)
NEUTROS ABS: 7.6 10*3/uL (ref 1.7–7.7)
Neutrophils Relative %: 89 % — ABNORMAL HIGH (ref 43–77)
Platelets: 157 10*3/uL (ref 150–400)
RBC: 3.13 MIL/uL — ABNORMAL LOW (ref 4.22–5.81)
RDW: 18.1 % — ABNORMAL HIGH (ref 11.5–15.5)
WBC: 8.6 10*3/uL (ref 4.0–10.5)

## 2014-12-08 LAB — COMPREHENSIVE METABOLIC PANEL
ALT: 23 U/L (ref 0–53)
AST: 26 U/L (ref 0–37)
Albumin: 2.9 g/dL — ABNORMAL LOW (ref 3.5–5.2)
Alkaline Phosphatase: 98 U/L (ref 39–117)
Anion gap: 8 (ref 5–15)
BUN: 32 mg/dL — ABNORMAL HIGH (ref 6–23)
CALCIUM: 8.8 mg/dL (ref 8.4–10.5)
CHLORIDE: 111 mmol/L (ref 96–112)
CO2: 26 mmol/L (ref 19–32)
Creatinine, Ser: 2.03 mg/dL — ABNORMAL HIGH (ref 0.50–1.35)
GFR, EST AFRICAN AMERICAN: 31 mL/min — AB (ref >90)
GFR, EST NON AFRICAN AMERICAN: 27 mL/min — AB (ref >90)
GLUCOSE: 99 mg/dL (ref 70–99)
Potassium: 3.7 mmol/L (ref 3.5–5.1)
Sodium: 145 mmol/L (ref 135–145)
Total Bilirubin: 0.9 mg/dL (ref 0.3–1.2)
Total Protein: 7 g/dL (ref 6.0–8.3)

## 2014-12-08 LAB — PREPARE RBC (CROSSMATCH)

## 2014-12-08 LAB — TSH: TSH: 2.9 u[IU]/mL (ref 0.350–4.500)

## 2014-12-08 LAB — TROPONIN I
Troponin I: 0.06 ng/mL — ABNORMAL HIGH (ref <0.031)
Troponin I: 0.12 ng/mL — ABNORMAL HIGH (ref <0.031)

## 2014-12-08 LAB — BRAIN NATRIURETIC PEPTIDE: B NATRIURETIC PEPTIDE 5: 800.1 pg/mL — AB (ref 0.0–100.0)

## 2014-12-08 LAB — RETICULOCYTES
RBC.: 3.36 MIL/uL — AB (ref 4.22–5.81)
Retic Count, Absolute: 60.5 10*3/uL (ref 19.0–186.0)
Retic Ct Pct: 1.8 % (ref 0.4–3.1)

## 2014-12-08 MED ORDER — FEBUXOSTAT 40 MG PO TABS
40.0000 mg | ORAL_TABLET | Freq: Every day | ORAL | Status: DC
  Administered 2014-12-09: 40 mg via ORAL
  Filled 2014-12-08 (×2): qty 1

## 2014-12-08 MED ORDER — FUROSEMIDE 10 MG/ML IJ SOLN
60.0000 mg | Freq: Three times a day (TID) | INTRAMUSCULAR | Status: DC
  Administered 2014-12-08 – 2014-12-09 (×3): 60 mg via INTRAVENOUS
  Filled 2014-12-08 (×5): qty 6

## 2014-12-08 MED ORDER — MORPHINE SULFATE 2 MG/ML IJ SOLN
1.0000 mg | INTRAMUSCULAR | Status: DC | PRN
  Administered 2014-12-08: 2 mg via INTRAVENOUS
  Filled 2014-12-08: qty 1

## 2014-12-08 MED ORDER — CIPROFLOXACIN HCL 500 MG PO TABS
500.0000 mg | ORAL_TABLET | Freq: Once | ORAL | Status: AC
  Administered 2014-12-08: 500 mg via ORAL
  Filled 2014-12-08: qty 1

## 2014-12-08 MED ORDER — METOPROLOL TARTRATE 50 MG PO TABS
50.0000 mg | ORAL_TABLET | Freq: Two times a day (BID) | ORAL | Status: DC
  Administered 2014-12-08 – 2014-12-09 (×2): 50 mg via ORAL
  Filled 2014-12-08 (×3): qty 1

## 2014-12-08 MED ORDER — ONDANSETRON HCL 4 MG/2ML IJ SOLN: 4.0000 mg | Freq: Four times a day (QID) | INTRAMUSCULAR | Status: DC | PRN

## 2014-12-08 MED ORDER — ACETAMINOPHEN 650 MG RE SUPP
650.0000 mg | Freq: Four times a day (QID) | RECTAL | Status: DC | PRN
Start: 2014-12-08 — End: 2014-12-22

## 2014-12-08 MED ORDER — SODIUM CHLORIDE 0.9 % IV SOLN: Freq: Once | INTRAVENOUS | Status: DC

## 2014-12-08 MED ORDER — OXYCODONE HCL 5 MG PO TABS: 5.0000 mg | ORAL_TABLET | ORAL | Status: DC | PRN

## 2014-12-08 MED ORDER — ONDANSETRON HCL 4 MG PO TABS: 4.0000 mg | ORAL_TABLET | Freq: Four times a day (QID) | ORAL | Status: DC | PRN

## 2014-12-08 MED ORDER — FUROSEMIDE 10 MG/ML IJ SOLN
60.0000 mg | Freq: Two times a day (BID) | INTRAMUSCULAR | Status: DC
  Administered 2014-12-08: 60 mg via INTRAVENOUS
  Filled 2014-12-08: qty 6

## 2014-12-08 MED ORDER — LEVOTHYROXINE SODIUM 25 MCG PO TABS
25.0000 ug | ORAL_TABLET | Freq: Every day | ORAL | Status: DC
  Filled 2014-12-08: qty 1

## 2014-12-08 MED ORDER — POLYETHYLENE GLYCOL 3350 17 G PO PACK
17.0000 g | PACK | Freq: Every day | ORAL | Status: DC | PRN
  Filled 2014-12-08: qty 1

## 2014-12-08 MED ORDER — ACETAMINOPHEN 325 MG PO TABS: 650.0000 mg | ORAL_TABLET | Freq: Four times a day (QID) | ORAL | Status: DC | PRN

## 2014-12-08 MED ORDER — ATORVASTATIN CALCIUM 10 MG PO TABS
10.0000 mg | ORAL_TABLET | Freq: Every day | ORAL | Status: DC
  Administered 2014-12-08 – 2014-12-10 (×2): 10 mg via ORAL
  Filled 2014-12-08 (×3): qty 1

## 2014-12-08 MED ORDER — HEPARIN SODIUM (PORCINE) 5000 UNIT/ML IJ SOLN
5000.0000 [IU] | Freq: Three times a day (TID) | INTRAMUSCULAR | Status: DC
  Administered 2014-12-09 – 2014-12-22 (×39): 5000 [IU] via SUBCUTANEOUS
  Filled 2014-12-08 (×49): qty 1

## 2014-12-08 MED ORDER — LOSARTAN POTASSIUM 50 MG PO TABS
50.0000 mg | ORAL_TABLET | Freq: Every day | ORAL | Status: DC
  Administered 2014-12-09: 50 mg via ORAL
  Filled 2014-12-08: qty 1

## 2014-12-08 MED ORDER — DOCUSATE SODIUM 100 MG PO CAPS
100.0000 mg | ORAL_CAPSULE | Freq: Two times a day (BID) | ORAL | Status: DC
  Administered 2014-12-08 – 2014-12-09 (×2): 100 mg via ORAL
  Filled 2014-12-08 (×2): qty 1

## 2014-12-08 MED ORDER — ALUM & MAG HYDROXIDE-SIMETH 200-200-20 MG/5ML PO SUSP: 30.0000 mL | Freq: Four times a day (QID) | ORAL | Status: DC | PRN

## 2014-12-08 NOTE — ED Provider Notes (Signed)
CSN: [id number]     Arrival date & time 12/08/14  1109 History   First MD Initiated Contact with Patient 12/08/14 1118     Chief Complaint  Patient presents with  . Fall     (Consider location/radiation/quality/duration/timing/severity/associated sxs/prior Treatment) Patient is a 81 y.o. male presenting with fall. The history is provided by the patient.  Fall This is a new problem. Pertinent negatives include no chest pain, no abdominal pain and no shortness of breath.   patient with a fall. Reportedly lost balance while walking with his walker. Complaining of pain in his left hip. States he does not hit his head. No pain in his chest or back. No confusion. No blood thinners. No chest pain. He has had a cough. He's been not been able to or since the fall.  Past Medical History  Diagnosis Date  . Hypertension   . History of prostate cancer 1996    treated with radiation  . Arthritis   . Gout, arthritis 2010    bil feet  . Cancer     PORSTATE-HX OF RADIATION AND SURGERY  . Diverticulosis of colon   . CAD (coronary artery disease)     cabg 1996; echo 03/15/2007-EF 45-50%, LA mod to severe dilated, mod TR, mod pulmonary HTN, mod sclerotic aortic valve; myoview6/23/2010- no ischemia  . A-fib     chronic, no longer on coumadin due to rectal bleeding  . Hyperlipidemia   . Rectal bleed   . CHF (congestive heart failure) 10/18/13   Past Surgical History  Procedure Laterality Date  . Coronary artery bypass graft  04/1995    LIMA to LAD, vein graft to diag branch, vein to 3 sequential marginal branches, and vein to PDA  . Appendectomy  1950s  . Eye surgery  2009    cataract surgery  . Cryoablation of prostate  06/23/2005  . Colonoscopy  11/03/2000  . Cardiac catheterization  05/04/2005    patent grafts  . Cardiac catheterization  04/10/1995    presyncopal episode on way to OR, 3V disease with left main involvement, nl LV, CVTS was contacted   Family History  Problem Relation Age of Onset   . Heart Problems Mother   . Heart Problems Father   . Cancer Brother   . Cancer Sister    History  Substance Use Topics  . Smoking status: Former Smoker    Quit date: 09/26/1984  . Smokeless tobacco: Never Used  . Alcohol Use: No    Review of Systems  Constitutional: Negative for fever.  HENT: Negative for sore throat and trouble swallowing.   Respiratory: Positive for cough. Negative for shortness of breath.   Cardiovascular: Positive for leg swelling. Negative for chest pain.  Gastrointestinal: Negative for abdominal pain.  Genitourinary: Negative for flank pain.  Musculoskeletal: Negative for back pain.  Skin: Positive for wound.  Neurological: Negative for syncope.      Allergies  Ancef; Ace inhibitors; Penicillins cross reactors; and Sulfa antibiotics  Home Medications   Prior to Admission medications   Medication Sig Start Date End Date Taking? Authorizing Provider  acetaminophen (TYLENOL) 325 MG tablet Take 2 tablets (650 mg total) by mouth every 6 (six) hours as needed. 04/18/13   [name], MD  atorvastatin (LIPITOR) 10 MG tablet Take 10 mg by mouth daily.    Historical Provider, MD  calcium carbonate (TUMS - DOSED IN MG ELEMENTAL CALCIUM) 500 MG chewable tablet Chew 1 tablet by mouth as needed.  Historical Provider, MD  febuxostat (ULORIC) 40 MG tablet Take 1 tablet (40 mg total) by mouth daily. 10/22/13   [name], MD  furosemide (LASIX) 20 MG tablet Take 20 mg by mouth daily.    Historical Provider, MD  levofloxacin (LEVAQUIN) 250 MG tablet Take 1 tablet at noon on 12/25 then stop 10/23/13   Velna Hatchet, MD  levothyroxine (SYNTHROID, LEVOTHROID) 25 MCG tablet Take 1 tablet (25 mcg total) by mouth daily before breakfast. 10/23/13   Velna Hatchet, MD  losartan (COZAAR) 100 MG tablet Take 50 mg by mouth daily.    Historical Provider, MD  metoprolol (LOPRESSOR) 50 MG tablet Take 50 mg by mouth 2 (two) times daily.      Historical Provider, MD   BP  161/112 mmHg  Pulse 91  Temp(Src) 97.6 F (36.4 C) (Oral)  Resp 28  SpO2 95% Physical Exam  Constitutional: He appears well-developed.  HENT:  Head: Normocephalic and atraumatic.  Eyes: Pupils are equal, round, and reactive to light.  Neck: Neck supple.  Cardiovascular:  irRegular rhythm  Pulmonary/Chest:  Diffuse somewhat harsh breath sounds.  Abdominal: Soft. There is no tenderness.  Musculoskeletal: He exhibits edema.  Chronic venous changes with some erythema and weepiness in bilateral lower extremities. Tenderness to left hip with decreased range of motion. No lumbar or cervical spine tenderness. No tenderness over knee.  Skin: Skin is warm.    ED Course  Procedures (including critical care time) Labs Review Labs Reviewed  CBC WITH DIFFERENTIAL/PLATELET - Abnormal; Notable for the following:    RBC 3.13 (*)    Hemoglobin 8.7 (*)    HCT 28.1 (*)    RDW 18.1 (*)    Neutrophils Relative % 89 (*)    Lymphocytes Relative 7 (*)    Lymphs Abs 0.6 (*)    All other components within normal limits  COMPREHENSIVE METABOLIC PANEL - Abnormal; Notable for the following:    BUN 32 (*)    Creatinine, Ser 2.03 (*)    Albumin 2.9 (*)    GFR calc non Af Amer 27 (*)    GFR calc Af Amer 31 (*)    All other components within normal limits  TROPONIN I - Abnormal; Notable for the following:    Troponin I 0.06 (*)    All other components within normal limits  BRAIN NATRIURETIC PEPTIDE - Abnormal; Notable for the following:    B Natriuretic Peptide 800.1 (*)    All other components within normal limits    Imaging Review Dg Chest 2 View  12/08/2014   CLINICAL DATA:  Status post fall this morning with a left hip fracture.  EXAM: CHEST  2 VIEW  COMPARISON:  PA and lateral chest 10/19/2013 and 05/17/2013.  FINDINGS: There is cardiomegaly and mild interstitial edema with small bilateral pleural effusions. No pneumothorax is identified. No focal bony abnormality is seen.  IMPRESSION: Mild  interstitial edema with associated small bilateral pleural effusions.   Electronically Signed   By: Inge Rise M.D.   On: 12/08/2014 13:25   Dg Hip Unilat With Pelvis 2-3 Views Left  12/08/2014   CLINICAL DATA:  Status post fall this morning with left hip pain. Initial encounter.  EXAM: LEFT HIP (WITH PELVIS) 2-3 VIEWS  COMPARISON:  None.  FINDINGS: Acute subcapital fracture of the left hip is identified. No other acute bony or joint abnormality is seen. Bones are osteopenic. Atherosclerosis is noted.  IMPRESSION: Acute subcapital fracture left hip.   Electronically  Signed   By: Inge Rise M.D.   On: 12/08/2014 13:24     EKG Interpretation   Date/Time:  Monday December 08 2014 12:23:16 EST Ventricular Rate:  91 PR Interval:    QRS Duration: 111 QT Interval:  409 QTC Calculation: 503 R Axis:   -47 Text Interpretation:  Atrial fibrillation Incomplete RBBB and LAFB  Probable anterior infarct, age indeterminate Prolonged QT interval  Confirmed by Alvino Chapel  MD, Ovid Curd [phone]) on 12/08/2014 2:10:00 PM      MDM   Final diagnoses:  Fall  Hip fracture requiring operative repair, left, closed, initial encounter  Congestive heart failure, unspecified congestive heart failure chronicity, unspecified congestive heart failure type    Patient with fall. Left subcapital hip fracture. Discussed with patient's PCP, Dr. Virgina Jock.   will admit to hospitalist will also consult. After discussion with orthopedic surgery surgery will not be today but maybe tomorrow.   Jasper Riling. Alvino Chapel, MD 12/08/14 1421

## 2014-12-08 NOTE — Consult Note (Signed)
Consult: Difficult Foley Requested by: Dr. Sloan Leiter  Chief Complaint: I had trouble since they froze my prostate.  History of Present Illness: 81 year old admitted with hip fracture.  He has CHF and lower extremity edema and is undergoing diuresis and preparation for hip surgery tomorrow.  Foley catheter is needed because the patient cannot ambulate, he is incontinent, a condom catheter would not stay in place and urine output measurement as needed.  A Foley catheter was attempted earlier and unsuccessful.  Patient has history of prostate cancer he underwent radiation therapy in 1996 and salvage cryotherapy in 2006.  He was seen in September 2014 when PSA was 0.7.  He also had an episode of gross hematuria and urinary retention in 2014 and underwent cystoscopy September 2014 which showed slightly enlarged prostate but was otherwise normal.  There were no strictures.   Past Medical History  Diagnosis Date  . Hypertension   . History of prostate cancer 1996    treated with radiation  . Arthritis   . Gout, arthritis 2010    bil feet  . Cancer     PORSTATE-HX OF RADIATION AND SURGERY  . Diverticulosis of colon   . CAD (coronary artery disease)     cabg 1996; echo 03/15/2007-EF 45-50%, LA mod to severe dilated, mod TR, mod pulmonary HTN, mod sclerotic aortic valve; myoview6/23/2010- no ischemia  . A-fib     chronic, no longer on coumadin due to rectal bleeding  . Hyperlipidemia   . Rectal bleed   . CHF (congestive heart failure) 10/18/13   Past Surgical History  Procedure Laterality Date  . Coronary artery bypass graft  04/1995    LIMA to LAD, vein graft to diag branch, vein to 3 sequential marginal branches, and vein to PDA  . Appendectomy  1950s  . Eye surgery  2009    cataract surgery  . Cryoablation of prostate  06/23/2005  . Colonoscopy  11/03/2000  . Cardiac catheterization  05/04/2005    patent grafts  . Cardiac catheterization  04/10/1995    presyncopal episode on way to OR, 3V  disease with left main involvement, nl LV, CVTS was contacted    Home Medications:  Prescriptions prior to admission  Medication Sig Dispense Refill Last Dose  . acetaminophen (TYLENOL) 325 MG tablet Take 2 tablets (650 mg total) by mouth every 6 (six) hours as needed.   12/07/2014 at Unknown time  . atorvastatin (LIPITOR) 10 MG tablet Take 10 mg by mouth daily.   12/07/2014 at Unknown time  . budesonide (ENTOCORT EC) 3 MG 24 hr capsule Take 9 mg by mouth daily.  3 12/08/2014 at Unknown time  . calcium carbonate (TUMS - DOSED IN MG ELEMENTAL CALCIUM) 500 MG chewable tablet Chew 1 tablet by mouth at bedtime.    12/07/2014 at Unknown time  . febuxostat (ULORIC) 40 MG tablet Take 1 tablet (40 mg total) by mouth daily. 30 tablet 6 12/07/2014 at Unknown time  . furosemide (LASIX) 20 MG tablet Take 20 mg by mouth daily.   12/08/2014 at Unknown time  . gabapentin (NEURONTIN) 100 MG capsule Take 100 mg by mouth 3 (three) times daily.   12/08/2014 at Unknown time  . levothyroxine (SYNTHROID, LEVOTHROID) 75 MCG tablet Take 75 mcg by mouth daily.  6 12/08/2014 at Unknown time  . losartan (COZAAR) 100 MG tablet Take 50 mg by mouth daily.   12/07/2014 at Unknown time  . metoprolol (LOPRESSOR) 50 MG tablet Take 50 mg by mouth 2 (two)  times daily.     12/08/2014 at 0700  . primidone (MYSOLINE) 50 MG tablet Take 25-50 mg by mouth See admin instructions. Take 0.5 tablets by mouth at bedtime for 1 week then increase to 1 tablet at bedtime   12/07/2014 at Unknown time  . levofloxacin (LEVAQUIN) 250 MG tablet Take 1 tablet at noon on 12/25 then stop (Patient not taking: Reported on 12/08/2014) 1 tablet 0 Completed Course at Unknown time  . levothyroxine (SYNTHROID, LEVOTHROID) 25 MCG tablet Take 1 tablet (25 mcg total) by mouth daily before breakfast. 30 tablet 2 duplicate   Allergies:  Allergies  Allergen Reactions  . Ancef [Cefazolin] Anaphylaxis  . Ace Inhibitors Cough  . Penicillins Cross Reactors     Anaphylaxix/don't give  anything in the "family" of penicillins  . Sulfa Antibiotics     Don't remember. Didn't want to give me anymore    Family History  Problem Relation Age of Onset  . Heart Problems Mother   . Heart Problems Father   . Cancer Brother   . Cancer Sister    Social History:  reports that he quit smoking about 30 years ago. He has never used smokeless tobacco. He reports that he does not drink alcohol or use illicit drugs.  ROS: A complete review of systems was performed.  All systems are negative except for pertinent findings as noted. Review of Systems  All other systems reviewed and are negative.    Physical Exam:  Vital signs in last 24 hours: Temp:  [97.6 F (36.4 C)] 97.6 F (36.4 C) (02/08 1114) Pulse Rate:  [78-111] 111 (02/08 1530) Resp:  [20-31] 29 (02/08 1530) BP: (143-180)/(75-112) 173/87 mmHg (02/08 1530) SpO2:  [91 %-98 %] 91 % (02/08 1530) General:  Alert and oriented, No acute distress HEENT: Normocephalic, atraumatic Neck: No JVD or lymphadenopathy Cardiovascular: Regular rate and rhythm Lungs: Regular rate and effort Abdomen: Soft, nontender, nondistended, no abdominal masses Back: No CVA tenderness Extremities: No edema Neurologic: Grossly intact GU: Phimosis  Laboratory Data:  Results for orders placed or performed during the hospital encounter of 12/08/14 (from the past 24 hour(s))  CBC with Differential     Status: Abnormal   Collection Time: 12/08/14 12:11 PM  Result Value Ref Range   WBC 8.6 4.0 - 10.5 K/uL   RBC 3.13 (L) 4.22 - 5.81 MIL/uL   Hemoglobin 8.7 (L) 13.0 - 17.0 g/dL   HCT 28.1 (L) 39.0 - 52.0 %   MCV 89.8 78.0 - 100.0 fL   MCH 27.8 26.0 - 34.0 pg   MCHC 31.0 30.0 - 36.0 g/dL   RDW 18.1 (H) 11.5 - 15.5 %   Platelets 157 150 - 400 K/uL   Neutrophils Relative % 89 (H) 43 - 77 %   Neutro Abs 7.6 1.7 - 7.7 K/uL   Lymphocytes Relative 7 (L) 12 - 46 %   Lymphs Abs 0.6 (L) 0.7 - 4.0 K/uL   Monocytes Relative 4 3 - 12 %   Monocytes  Absolute 0.4 0.1 - 1.0 K/uL   Eosinophils Relative 0 0 - 5 %   Eosinophils Absolute 0.0 0.0 - 0.7 K/uL   Basophils Relative 0 0 - 1 %   Basophils Absolute 0.0 0.0 - 0.1 K/uL  Comprehensive metabolic panel     Status: Abnormal   Collection Time: 12/08/14 12:11 PM  Result Value Ref Range   Sodium 145 135 - 145 mmol/L   Potassium 3.7 3.5 - 5.1 mmol/L   Chloride  111 96 - 112 mmol/L   CO2 26 19 - 32 mmol/L   Glucose, Bld 99 70 - 99 mg/dL   BUN 32 (H) 6 - 23 mg/dL   Creatinine, Ser 2.03 (H) 0.50 - 1.35 mg/dL   Calcium 8.8 8.4 - 10.5 mg/dL   Total Protein 7.0 6.0 - 8.3 g/dL   Albumin 2.9 (L) 3.5 - 5.2 g/dL   AST 26 0 - 37 U/L   ALT 23 0 - 53 U/L   Alkaline Phosphatase 98 39 - 117 U/L   Total Bilirubin 0.9 0.3 - 1.2 mg/dL   GFR calc non Af Amer 27 (L) >90 mL/min   GFR calc Af Amer 31 (L) >90 mL/min   Anion gap 8 5 - 15  Troponin I     Status: Abnormal   Collection Time: 12/08/14 12:11 PM  Result Value Ref Range   Troponin I 0.06 (H) <0.031 ng/mL  Brain natriuretic peptide     Status: Abnormal   Collection Time: 12/08/14 12:11 PM  Result Value Ref Range   B Natriuretic Peptide 800.1 (H) 0.0 - 100.0 pg/mL  TSH     Status: None   Collection Time: 12/08/14  3:01 PM  Result Value Ref Range   TSH 2.900 0.350 - 4.500 uIU/mL  Reticulocytes     Status: Abnormal   Collection Time: 12/08/14  3:01 PM  Result Value Ref Range   Retic Ct Pct 1.8 0.4 - 3.1 %   RBC. 3.36 (L) 4.22 - 5.81 MIL/uL   Retic Count, Manual 60.5 19.0 - 186.0 K/uL  Troponin I     Status: Abnormal   Collection Time: 12/08/14  6:38 PM  Result Value Ref Range   Troponin I 0.12 (H) <0.031 ng/mL   No results found for this or any previous visit (from the past 240 hour(s)). Creatinine:  Recent Labs  12/08/14 1211  CREATININE 2.03*    Impression/Assessment/Plan: -Incontinence, CHF, need for Foley catheter and urine output monitoring.  I suspect the difficulty was due to the phimosis.  We'll plan to attempt Foley  catheter placement.  I discussed with the patient [employer] benefits and alternatives to Foley catheter placement and he elected to proceed.   [name] [name] 12/08/2014, 10:17 PM

## 2014-12-08 NOTE — ED Notes (Signed)
Attempted foley with second nurse unable to successfully inset foley.

## 2014-12-08 NOTE — Consult Note (Signed)
Reason for Consult: surgical clearance for hip surgery after mechanical fall   Referring Physician: Dr.  Sloan Leiter  PCP:  [name], MD  Primary Cardiologist:Dr. Veronia Beets Morre is an 81 y.o. male.    Chief Complaint: fell at home  while trying to get dressed, fell on lt side.  HPI:  81 year old male with a history of CAD, s/p CABG in 1996. His last cath was in 2006 and his grafts at that time were patent. Last nuc stress 03/2009 with low risk scan.  He also has chronic atrial fibrillation, had been on Coumadin for [facility] but stopped 2014 due to GI bleed.  HTN and HLD, as well as CKD 3.    No recent cardiology follow up last note 2014.  Previously has been DNR in 2014.  Today while getting dressed he fell on his lt hip and in ER he was found to have an acute subcapital fracture of the left hip.  Ortho consulted.  Currently in CHF with Pro BNP of 800.  Also anemic with H/H 8.7/28.  Denies chest pain. He rec'd IV lasi 60 mg in ER and to receive every 12 hours.  He is on BB 50 mg BID his usual.   Other hx moderate aortic stenosis noted on echo in 2014 with severe pulmonary hypertension and RV dilatation and diastolic heart failure,  skipping diuretic dosages because of chronic urinary dribbling/incontinence. Recently he has developed progression of respiratory symptoms and is now requiring oxygen at home. His chest x-ray in the ER revealed mild interstitial edema and associated small bilateral pleural effusions. He is requiring oxygen in the emergency department.  EKG today Atrial fibrillation Incomplete RBBB and LAFB, Probable anterior infarct, age indeterminate, Prolonged QT interval.    Last Echo 2014 with EF 55-60%  Ventricular septum: Septal motion showed paradox. - Aortic valve: There was moderate stenosis. Valve area: 0.95cm^2(VTI). Valve area: 0.84cm^2 (Vmax). - Mitral valve: Calcified annulus. Moderate regurgitation directed eccentrically and toward the free  wall. - Left atrium: The atrium was severely dilated. - Right ventricle: The cavity size was moderately dilated. Systolic function was moderately reduced. - Right atrium: The atrium was severely dilated. - Atrial septum: The septum bowed from left to right, consistent with increased left atrial pressure. No defect or patent foramen ovale was identified. - Tricuspid valve: Moderate regurgitation. - Pulmonary arteries: Systolic pressure was moderately to severely increased. PA peak pressure: 29mm Hg (S).  IM saw pt and family was notified of high risk for surgery.  They are leaning towards accepting risks.  Currently with mild decompensated CHF.    Past Medical History  Diagnosis Date  . Hypertension   . History of prostate cancer 1996    treated with radiation  . Arthritis   . Gout, arthritis 2010    bil feet  . Cancer     PORSTATE-HX OF RADIATION AND SURGERY  . Diverticulosis of colon   . CAD (coronary artery disease)     cabg 1996; echo 03/15/2007-EF 45-50%, LA mod to severe dilated, mod TR, mod pulmonary HTN, mod sclerotic aortic valve; myoview6/23/2010- no ischemia  . A-fib     chronic, no longer on coumadin due to rectal bleeding  . Hyperlipidemia   . Rectal bleed   . CHF (congestive heart failure) 10/18/13    Past Surgical History  Procedure Laterality Date  . Coronary artery bypass graft  04/1995    LIMA to LAD,  vein graft to diag branch, vein to 3 sequential marginal branches, and vein to PDA  . Appendectomy  1950s  . Eye surgery  2009    cataract surgery  . Cryoablation of prostate  06/23/2005  . Colonoscopy  11/03/2000  . Cardiac catheterization  05/04/2005    patent grafts  . Cardiac catheterization  04/10/1995    presyncopal episode on way to OR, 3V disease with left main involvement, nl LV, CVTS was contacted    Family History  Problem Relation Age of Onset  . Heart Problems Mother   . Heart Problems Father   . Cancer Brother   . Cancer Sister     Social History:  reports that he quit smoking about 30 years ago. He has never used smokeless tobacco. He reports that he does not drink alcohol or use illicit drugs.  Allergies:  Allergies  Allergen Reactions  . Ancef [Cefazolin] Anaphylaxis  . Ace Inhibitors Cough  . Penicillins Cross Reactors     Anaphylaxix/don't give anything in the "family" of penicillins  . Sulfa Antibiotics     Don't remember. Didn't want to give me anymore    Medications Prior to Admission  Medication Sig Dispense Refill  . acetaminophen (TYLENOL) 325 MG tablet Take 2 tablets (650 mg total) by mouth every 6 (six) hours as needed.    Marland Kitchen atorvastatin (LIPITOR) 10 MG tablet Take 10 mg by mouth daily.    . budesonide (ENTOCORT EC) 3 MG 24 hr capsule Take 9 mg by mouth daily.  3  . calcium carbonate (TUMS - DOSED IN MG ELEMENTAL CALCIUM) 500 MG chewable tablet Chew 1 tablet by mouth at bedtime.     . febuxostat (ULORIC) 40 MG tablet Take 1 tablet (40 mg total) by mouth daily. 30 tablet 6  . furosemide (LASIX) 20 MG tablet Take 20 mg by mouth daily.    Marland Kitchen gabapentin (NEURONTIN) 100 MG capsule Take 100 mg by mouth 3 (three) times daily.    Marland Kitchen levothyroxine (SYNTHROID, LEVOTHROID) 75 MCG tablet Take 75 mcg by mouth daily.  6  . losartan (COZAAR) 100 MG tablet Take 50 mg by mouth daily.    . metoprolol (LOPRESSOR) 50 MG tablet Take 50 mg by mouth 2 (two) times daily.      . primidone (MYSOLINE) 50 MG tablet Take 25-50 mg by mouth See admin instructions. Take 0.5 tablets by mouth at bedtime for 1 week then increase to 1 tablet at bedtime    . levofloxacin (LEVAQUIN) 250 MG tablet Take 1 tablet at noon on 12/25 then stop (Patient not taking: Reported on 12/08/2014) 1 tablet 0  . levothyroxine (SYNTHROID, LEVOTHROID) 25 MCG tablet Take 1 tablet (25 mcg total) by mouth daily before breakfast. 30 tablet 2    Results for orders placed or performed during the hospital encounter of 12/08/14 (from the past 48 hour(s))  CBC with  Differential     Status: Abnormal   Collection Time: 12/08/14 12:11 PM  Result Value Ref Range   WBC 8.6 4.0 - 10.5 K/uL   RBC 3.13 (L) 4.22 - 5.81 MIL/uL   Hemoglobin 8.7 (L) 13.0 - 17.0 g/dL   HCT 28.1 (L) 39.0 - 52.0 %   MCV 89.8 78.0 - 100.0 fL   MCH 27.8 26.0 - 34.0 pg   MCHC 31.0 30.0 - 36.0 g/dL   RDW 18.1 (H) 11.5 - 15.5 %   Platelets 157 150 - 400 K/uL   Neutrophils Relative % 89 (H) 43 -  77 %   Neutro Abs 7.6 1.7 - 7.7 K/uL   Lymphocytes Relative 7 (L) 12 - 46 %   Lymphs Abs 0.6 (L) 0.7 - 4.0 K/uL   Monocytes Relative 4 3 - 12 %   Monocytes Absolute 0.4 0.1 - 1.0 K/uL   Eosinophils Relative 0 0 - 5 %   Eosinophils Absolute 0.0 0.0 - 0.7 K/uL   Basophils Relative 0 0 - 1 %   Basophils Absolute 0.0 0.0 - 0.1 K/uL  Comprehensive metabolic panel     Status: Abnormal   Collection Time: 12/08/14 12:11 PM  Result Value Ref Range   Sodium 145 135 - 145 mmol/L   Potassium 3.7 3.5 - 5.1 mmol/L   Chloride 111 96 - 112 mmol/L   CO2 26 19 - 32 mmol/L   Glucose, Bld 99 70 - 99 mg/dL   BUN 32 (H) 6 - 23 mg/dL   Creatinine, Ser 2.03 (H) 0.50 - 1.35 mg/dL   Calcium 8.8 8.4 - 10.5 mg/dL   Total Protein 7.0 6.0 - 8.3 g/dL   Albumin 2.9 (L) 3.5 - 5.2 g/dL   AST 26 0 - 37 U/L   ALT 23 0 - 53 U/L   Alkaline Phosphatase 98 39 - 117 U/L   Total Bilirubin 0.9 0.3 - 1.2 mg/dL   GFR calc non Af Amer 27 (L) >90 mL/min   GFR calc Af Amer 31 (L) >90 mL/min    Comment: (NOTE) The eGFR has been calculated using the CKD EPI equation. This calculation has not been validated in all clinical situations. eGFR's persistently <90 mL/min signify possible Chronic Kidney Disease.    Anion gap 8 5 - 15  Troponin I     Status: Abnormal   Collection Time: 12/08/14 12:11 PM  Result Value Ref Range   Troponin I 0.06 (H) <0.031 ng/mL    Comment:        PERSISTENTLY INCREASED TROPONIN VALUES IN THE RANGE OF 0.04-0.49 ng/mL CAN BE SEEN IN:       -UNSTABLE ANGINA       -CONGESTIVE HEART FAILURE        -MYOCARDITIS       -CHEST TRAUMA       -ARRYHTHMIAS       -LATE PRESENTING MYOCARDIAL INFARCTION       -COPD   CLINICAL FOLLOW-UP RECOMMENDED.   Brain natriuretic peptide     Status: Abnormal   Collection Time: 12/08/14 12:11 PM  Result Value Ref Range   B Natriuretic Peptide 800.1 (H) 0.0 - 100.0 pg/mL  Reticulocytes     Status: Abnormal   Collection Time: 12/08/14  3:01 PM  Result Value Ref Range   Retic Ct Pct 1.8 0.4 - 3.1 %   RBC. 3.36 (L) 4.22 - 5.81 MIL/uL   Retic Count, Manual 60.5 19.0 - 186.0 K/uL   Dg Chest 2 View  12/08/2014   CLINICAL DATA:  Status post fall this morning with a left hip fracture.  EXAM: CHEST  2 VIEW  COMPARISON:  PA and lateral chest 10/19/2013 and 05/17/2013.  FINDINGS: There is cardiomegaly and mild interstitial edema with small bilateral pleural effusions. No pneumothorax is identified. No focal bony abnormality is seen.  IMPRESSION: Mild interstitial edema with associated small bilateral pleural effusions.   Electronically Signed   By: Inge Rise M.D.   On: 12/08/2014 13:25   Dg Hip Unilat With Pelvis 2-3 Views Left  12/08/2014   CLINICAL DATA:  Status post  fall this morning with left hip pain. Initial encounter.  EXAM: LEFT HIP (WITH PELVIS) 2-3 VIEWS  COMPARISON:  None.  FINDINGS: Acute subcapital fracture of the left hip is identified. No other acute bony or joint abnormality is seen. Bones are osteopenic. Atherosclerosis is noted.  IMPRESSION: Acute subcapital fracture left hip.   Electronically Signed   By: Drusilla Kanner M.D.   On: 12/08/2014 13:24    ROS: General:no colds or fevers, no weight changes in 2014 Skin:no rashes or ulcers HEENT:no blurred vision, no congestion CV:see HPI PUL:see HPI GI:no diarrhea constipation or melena, no indigestion GU:no hematuria, no dysuria MS:+ Lt hip pain today, no claudication Neuro:no syncope, no lightheadedness Endo:no diabetes, no thyroid disease   Blood pressure 173/87, pulse 111, temperature  97.6 F (36.4 C), temperature source Oral, resp. rate 29, SpO2 91 %.  Wt Readings from Last 3 Encounters:  10/23/13 168 lb 14 oz (76.6 kg)  07/25/13 162 lb (73.483 kg)  05/30/13 185 lb (83.915 kg)    PE: General:Pleasant affect, NAD, though SOB Skin:Warm and dry, brisk capillary refill HEENT:normocephalic, sclera clear, mucus membranes moist Neck:supple, no JVD, no bruits  Heart: irreg irreg with soft murmur, no gallup, rub or click Lungs: with rales through out, + rhonchi, no wheezes MGU:POUS, non tender, + BS, do not palpate liver spleen or masses Ext:1-2+ lower ext edema lower ext around ankles red bil.   2+ pedal pulses, 2+ radial pulses Neuro:alert and oriented X 3, MAE except lt leg, follows commands, + facial symmetry    Assessment/Plan Principal Problem:   Closed left hip fracture with need for surgery - pt lives alone with help from Home health Active Problems:   Chronic diastolic heart failure, NYHA class 1- now acute has rec'd lasix once and will continue IV lasix though may need higher dose  Foley cath ordered, unable to place RN has called for specialty catheter.   Atrial fibrillation- permanent with CHA2DS2VASc of 5, but not on anticoagulation due to GI bleed.    CKD (chronic kidney disease) stage 3, GFR 30-59 ml/min to stage 4    Anemia chronic 09/2013 was 9.1/28.4   HTN (hypertension)- elevated today.   Dyslipidemia on statin   Moderate aortic stenosis- in 2014 agree to recheck Echo   Closed fracture of left hip   Hip fracture requiring operative repair   CAD in native artery, s/p CABG 1996 with patent grafts 2006 troponin is elevated at 0.06 would follow with serial troponin.    DNR status  Agree this will be high risk, will need to clear his lungs to improve his status.      [facility] R  Nurse Practitioner Certified [facility] Pager [phone] or after 5pm or weekends call [phone] 12/08/2014, 4:13 PM      I have seen and  examined the patient along with [facility] R NP.  I have reviewed the chart, notes and new data.  I agree with NP's note.  Key new complaints: hi has dyspnea at rest and appears orthopneic Key examination changes: bilateral ralse and S3, mid peaking AS murmur, but distinct loud S2 and non-delayed carotids Key new findings / data: CXR c/w CHF and pleural effusions  PLAN: He has decompensated heart failure and is not ready for surgery.  Even after we achieve a good degree hemodynamic compensation, he will remain high risk for hip surgery due to the presence of significant PAH, AS advanced age and numerous comorbid conditions. Nevertheless, the risk  of conservative non surgical management of his hip fracture is probably even higher.  Sanda Klein, MD, [place] and [place] [phone]

## 2014-12-08 NOTE — ED Notes (Signed)
Admit Doctor at bedside.  

## 2014-12-08 NOTE — ED Notes (Signed)
Pt. [place] while trying to get dressed. Fell from walker. Witnessed fall by home health aide. Pt. States to EMS, "home health aide did not help out." pt. Fell on left side and did not hit his head. C/o left hip pain.

## 2014-12-08 NOTE — ED Notes (Signed)
Ed, RN moved pt from hallway to room; myself, [place], Therapist, sports and Ed, RN undressed pt, placed in gown, on monitor, continuous pulse oximetry, blood pressure cuff and oxygen [place] (4L); visitors at bedside

## 2014-12-08 NOTE — H&P (Signed)
[facility] History and Physical                                                                                    [name] [name], is a 81 y.o. male  MRN: [id number]   DOB - 1921/10/17  Admit Date - 12/08/2014  Outpatient Primary MD for the patient is [name], MD  With History of -  Past Medical History  Diagnosis Date  . Hypertension   . History of prostate cancer 1996    treated with radiation  . Arthritis   . Gout, arthritis 2010    bil feet  . Cancer     PORSTATE-HX OF RADIATION AND SURGERY  . Diverticulosis of colon   . CAD (coronary artery disease)     cabg 1996; echo 03/15/2007-EF 45-50%, LA mod to severe dilated, mod TR, mod pulmonary HTN, mod sclerotic aortic valve; myoview6/23/2010- no ischemia  . A-fib     chronic, no longer on coumadin due to rectal bleeding  . Hyperlipidemia   . Rectal bleed   . CHF (congestive heart failure) 10/18/13      Past Surgical History  Procedure Laterality Date  . Coronary artery bypass graft  04/1995    LIMA to LAD, vein graft to diag branch, vein to 3 sequential marginal branches, and vein to PDA  . Appendectomy  1950s  . Eye surgery  2009    cataract surgery  . Cryoablation of prostate  06/23/2005  . Colonoscopy  11/03/2000  . Cardiac catheterization  05/04/2005    patent grafts  . Cardiac catheterization  04/10/1995    presyncopal episode on way to OR, 3V disease with left main involvement, nl LV, CVTS was contacted    in for   Chief Complaint  Patient presents with  . Fall     HPI [name] [name]  is a 81 y.o. male, who lives at home and has home health aides assist him. After taking a bath today and while attempting to put on his clothes, he slipped and fell and was complaining of left hip pain. Upon presentation to the ER he was found to have an acute subcapital fracture of the left hip. Orthopedic surgery has been consulted by the EDP. This patient has a past medical history of coronary artery disease/CABG in 1996,  chronic atrial fibrillation deemed inappropriate for anticoagulation, dyslipidemia, hypertension, chronic kidney disease, moderate aortic stenosis with severe pulmonary hypertension and RV dilatation and diastolic heart failure. Patient has a history of skipping diuretic dosages because of chronic urinary dribbling/incontinence. Recently he has developed progression of respiratory symptoms and is now requiring oxygen at home. His chest x-ray in the ER revealed mild interstitial edema and associated small bilateral pleural effusions. He is requiring oxygen in the emergency department.  Review of Systems   In addition to the HPI above: No Fever-chills, myalgias or other constitutional symptoms No Headache, changes with Vision or hearing, new weakness, tingling, numbness in any extremity, No problems swallowing food or Liquids, indigestion/reflux No Chest pain or Cough No Abdominal pain, N/V; no melena or hematochezia, no dark tarry stools, Bowel movements are regular, No dysuria, hematuria or flank pain  No new skin rashes, lesions, masses or bruises, No recent weight gain or loss No polyuria, polydypsia or polyphagia,  *A full 10 point Review of Systems was done, except as stated above, all other Review of Systems were negative.  Social History History  Substance Use Topics  . Smoking status: Former Smoker    Quit date: 09/26/1984  . Smokeless tobacco: Never Used  . Alcohol Use: No    Family History Family History  Problem Relation Age of Onset  . Heart Problems Mother   . Heart Problems Father   . Cancer Brother   . Cancer Sister     Prior to Admission medications   Medication Sig Start Date End Date Taking? Authorizing Provider  acetaminophen (TYLENOL) 325 MG tablet Take 2 tablets (650 mg total) by mouth every 6 (six) hours as needed. 04/18/13   [name], MD  atorvastatin (LIPITOR) 10 MG tablet Take 10 mg by mouth daily.    Historical Provider, MD  calcium carbonate (TUMS -  DOSED IN MG ELEMENTAL CALCIUM) 500 MG chewable tablet Chew 1 tablet by mouth as needed.    Historical Provider, MD  febuxostat (ULORIC) 40 MG tablet Take 1 tablet (40 mg total) by mouth daily. 10/22/13   [name], MD  furosemide (LASIX) 20 MG tablet Take 20 mg by mouth daily.    Historical Provider, MD  levofloxacin (LEVAQUIN) 250 MG tablet Take 1 tablet at noon on 12/25 then stop 10/23/13   Velna Hatchet, MD  levothyroxine (SYNTHROID, LEVOTHROID) 25 MCG tablet Take 1 tablet (25 mcg total) by mouth daily before breakfast. 10/23/13   Velna Hatchet, MD  losartan (COZAAR) 100 MG tablet Take 50 mg by mouth daily.    Historical Provider, MD  metoprolol (LOPRESSOR) 50 MG tablet Take 50 mg by mouth 2 (two) times daily.      Historical Provider, MD    Allergies  Allergen Reactions  . Ancef [Cefazolin] Anaphylaxis  . Ace Inhibitors Cough  . Penicillins Cross Reactors     Anaphylaxix/don't give anything in the "family" of penicillins  . Sulfa Antibiotics     Don't remember. Didn't want to give me anymore    Physical Exam  Vitals  Blood pressure 180/75, pulse 109, temperature 97.6 F (36.4 C), temperature source Oral, resp. rate 28, SpO2 93 %.   General:  In mild respiratory distress as evidenced by slight increased work of breathing and inability to lay flat in bed  Psych:  Normal affect, Denies Suicidal or Homicidal ideations, Awake Alert, Oriented X 3. Hard of hearing Speech and thought patterns are clear and appropriate  Neuro:   No focal neurological deficits, CN II through XII intact, Strength 5/5 all 4 extremities except for left lower extremity which is limited due to pain from recent hip fracture, Sensation intact all 4 extremities.  ENT:  Ears and Eyes appear Normal, Conjunctivae clear, PERL. Moist oral mucosa without erythema or exudates.  Neck:  Supple, No lymphadenopathy appreciated  Respiratory:  Symmetrical chest wall movement, Good air movement bilaterally, CTAB. Room  Air  Cardiac:  RRR, No Murmurs, marked tight and brawny 2-3 + bilateral LE edema noted, no JVD, No carotid bruits, peripheral pulses palpable at 2+  Abdomen:  Positive bowel sounds, Soft, Non tender, Non distended,  No masses appreciated, no obvious hepatosplenomegaly  Skin:  No Cyanosis, Normal Skin Turgor, no Rash or Bruise. Lower extremity skin changes consistent with chronic stasis dermatitis  Extremities: Symmetrical,  no effusions.  Data  Review  CBC  Recent Labs Lab 12/08/14 1211  WBC 8.6  HGB 8.7*  HCT 28.1*  PLT 157  MCV 89.8  MCH 27.8  MCHC 31.0  RDW 18.1*  LYMPHSABS 0.6*  MONOABS 0.4  EOSABS 0.0  BASOSABS 0.0    Chemistries   Recent Labs Lab 12/08/14 1211  NA 145  K 3.7  CL 111  CO2 26  GLUCOSE 99  BUN 32*  CREATININE 2.03*  CALCIUM 8.8  AST 26  ALT 23  ALKPHOS 98  BILITOT 0.9    CrCl cannot be calculated (Unknown ideal weight.).  No results for input(s): TSH, T4TOTAL, T3FREE, THYROIDAB in the last 72 hours.  Invalid input(s): FREET3  Coagulation profile No results for input(s): INR, PROTIME in the last 168 hours.  No results for input(s): DDIMER in the last 72 hours.  Cardiac Enzymes  Recent Labs Lab 12/08/14 1211  TROPONINI 0.06*    Invalid input(s): POCBNP  Urinalysis    Component Value Date/Time    pending collection                                                              Imaging results:   Dg Chest 2 View  12/08/2014   CLINICAL DATA:  Status post fall this morning with a left hip fracture.  EXAM: CHEST  2 VIEW  COMPARISON:  PA and lateral chest 10/19/2013 and 05/17/2013.  FINDINGS: There is cardiomegaly and mild interstitial edema with small bilateral pleural effusions. No pneumothorax is identified. No focal bony abnormality is seen.  IMPRESSION: Mild interstitial edema with associated small bilateral pleural effusions.   Electronically Signed   By: Inge Rise M.D.   On: 12/08/2014 13:25   Dg Hip  Unilat With Pelvis 2-3 Views Left  12/08/2014   CLINICAL DATA:  Status post fall this morning with left hip pain. Initial encounter.  EXAM: LEFT HIP (WITH PELVIS) 2-3 VIEWS  COMPARISON:  None.  FINDINGS: Acute subcapital fracture of the left hip is identified. No other acute bony or joint abnormality is seen. Bones are osteopenic. Atherosclerosis is noted.  IMPRESSION: Acute subcapital fracture left hip.   Electronically Signed   By: Inge Rise M.D.   On: 12/08/2014 13:24     EKG: Atrial fibrillation, ventricular rate 91   Assessment & Plan  Active Problems:   Closed fracture of left hip  -Nothing by mouth until can be evaluated by Orthopedic surgery -Provide appropriate analgesics -Will need to improve respiratory/heart failure symptoms prior to surgery (see below) -Admit to telemetry bed on orthopedic floor    Atrial fibrillation -Currently rate controlled -Continue Lopressor -Not on anticoagulation    Acute on chronic hypoxemic respiratory failure due to:   A) Moderate aortic stenosis with associated severe pulmonary hypertension/RV dilatation   B) Acute on Chronic diastolic heart failure, NYHA class 1 -Current heart exacerbation seems driven by patient noncompliance with diuretics at home -BNP 800 -Last echocardiogram 2014 so we'll repeat this admission -Insert Foley catheter and begin Lasix 60 mg IV every 12 hours -Continue ARB -Accurate INR and daily weights -Continue supportive care with oxygen -Hopefully can diurese enough to pursue operative intervention on 12/09/14 -Discussed with patient and family that current respiratory symptoms worse primary acute risk factor for undergoing surgical treatment; other  risk factors include advanced age, chronic kidney disease, and known CAD -CONSULT CARDIOLOGY    CKD stage 3, GFR 30-59 ml/min -Baseline renal function as of 2014 was 56/2.11 -Current renal function 32/2.03    CAD/CABG 1996 -Troponin 0.06 -Currently denying any  typical ischemic symptoms -EKG also nonischemic -Cycle cardiac enzymes    Anemia -Baseline hemoglobin around 9.1 -Current hemoglobin 8.7 and likely reflective of volume overload -Check anemia panel preoperatively    HTN  -Current blood pressure inadequately controlled and likely reflective of pain from hip fracture as well as to some degree inadequate diuresis -continue home medications -Diuresis as above    Dyslipidemia -No medications prior to admission    Hypothyroidism -Continue Synthroid -TSH    History of prostatic problems and urinary incontinence -Check urinalysis and culture   DVT Prophylaxis: subcutaneous heparin  Family Communication:   Family at bedside  Code Status:  DO NOT RESUSCITATE  Condition:  Guarded  Time spent in minutes : 60   Kaili Castille L. ANP on 12/08/2014 at 2:33 PM  Between 7am to 7pm - Pager - [phone]  After 7pm go to [email or web address] - password [email or web address]  And look for the night coverage person covering me after hours  [facility]

## 2014-12-08 NOTE — Progress Notes (Signed)
Attempted without success inserting coude catheter d/t obstruction. Notified attending - referred to NP who notified urologist. Ordered cysto cart and cysto scope to be picked up STAT from the OR for urology at the bed side.

## 2014-12-08 NOTE — Progress Notes (Signed)
Pre-procedure diagnosis: Phimosis, incontinence, CHF Post procedure diagnosis: Same  Procedure: Foley catheter placement  Surgeon: Junious Silk  Findings: Phimosis  Drains: 18 French coud Foley catheter  Description of procedure: After consent was obtained the patient's penis was prepped and draped in the usual sterile fashion.The glans penis was grasped and a coud catheter was advanced through the phimotic ring.  After several attempts I was able to negotiate the coud tip down into the meatus and the urethra.  The catheter was placed without difficulty.  The balloon was inflated and seated at the bladder neck.  Urine drainage was clear.  Disposition: Continue Foley catheter until patient is increasing ambulation and urine output is no longer needed. Foley should be changed or removed in 3-4 weeks.

## 2014-12-08 NOTE — Consult Note (Signed)
Reason for Consult:  Displaced left hip femoral neck fracture Referring Physician:   EDP  [name] [name] is an 81 y.o. male.  HPI:   81 yo male with multiple medical problems who sustained a mechanical fall this am.  Was brought to the [facility] ED and found to have a displaced left hip femoral neck fracture.  Past Medical History  Diagnosis Date  . Hypertension   . History of prostate cancer 1996    treated with radiation  . Arthritis   . Gout, arthritis 2010    bil feet  . Cancer     PORSTATE-HX OF RADIATION AND SURGERY  . Diverticulosis of colon   . CAD (coronary artery disease)     cabg 1996; echo 03/15/2007-EF 45-50%, LA mod to severe dilated, mod TR, mod pulmonary HTN, mod sclerotic aortic valve; myoview6/23/2010- no ischemia  . A-fib     chronic, no longer on coumadin due to rectal bleeding  . Hyperlipidemia   . Rectal bleed   . CHF (congestive heart failure) 10/18/13    Past Surgical History  Procedure Laterality Date  . Coronary artery bypass graft  04/1995    LIMA to LAD, vein graft to diag branch, vein to 3 sequential marginal branches, and vein to PDA  . Appendectomy  1950s  . Eye surgery  2009    cataract surgery  . Cryoablation of prostate  06/23/2005  . Colonoscopy  11/03/2000  . Cardiac catheterization  05/04/2005    patent grafts  . Cardiac catheterization  04/10/1995    presyncopal episode on way to OR, 3V disease with left main involvement, nl LV, CVTS was contacted    Family History  Problem Relation Age of Onset  . Heart Problems Mother   . Heart Problems Father   . Cancer Brother   . Cancer Sister     Social History:  reports that he quit smoking about 30 years ago. He has never used smokeless tobacco. He reports that he does not drink alcohol or use illicit drugs.  Allergies:  Allergies  Allergen Reactions  . Ancef [Cefazolin] Anaphylaxis  . Ace Inhibitors Cough  . Penicillins Cross Reactors     Anaphylaxix/don't give anything in the "family" of  penicillins  . Sulfa Antibiotics     Don't remember. Didn't want to give me anymore    Medications: I have reviewed the patient's current medications.  Results for orders placed or performed during the hospital encounter of 12/08/14 (from the past 48 hour(s))  CBC with Differential     Status: Abnormal   Collection Time: 12/08/14 12:11 PM  Result Value Ref Range   WBC 8.6 4.0 - 10.5 K/uL   RBC 3.13 (L) 4.22 - 5.81 MIL/uL   Hemoglobin 8.7 (L) 13.0 - 17.0 g/dL   HCT 08.5 (L) 99.0 - 94.8 %   MCV 89.8 78.0 - 100.0 fL   MCH 27.8 26.0 - 34.0 pg   MCHC 31.0 30.0 - 36.0 g/dL   RDW 21.2 (H) 85.7 - 56.2 %   Platelets 157 150 - 400 K/uL   Neutrophils Relative % 89 (H) 43 - 77 %   Neutro Abs 7.6 1.7 - 7.7 K/uL   Lymphocytes Relative 7 (L) 12 - 46 %   Lymphs Abs 0.6 (L) 0.7 - 4.0 K/uL   Monocytes Relative 4 3 - 12 %   Monocytes Absolute 0.4 0.1 - 1.0 K/uL   Eosinophils Relative 0 0 - 5 %   Eosinophils Absolute  0.0 0.0 - 0.7 K/uL   Basophils Relative 0 0 - 1 %   Basophils Absolute 0.0 0.0 - 0.1 K/uL  Comprehensive metabolic panel     Status: Abnormal   Collection Time: 12/08/14 12:11 PM  Result Value Ref Range   Sodium 145 135 - 145 mmol/L   Potassium 3.7 3.5 - 5.1 mmol/L   Chloride 111 96 - 112 mmol/L   CO2 26 19 - 32 mmol/L   Glucose, Bld 99 70 - 99 mg/dL   BUN 32 (H) 6 - 23 mg/dL   Creatinine, Ser 2.03 (H) 0.50 - 1.35 mg/dL   Calcium 8.8 8.4 - 10.5 mg/dL   Total Protein 7.0 6.0 - 8.3 g/dL   Albumin 2.9 (L) 3.5 - 5.2 g/dL   AST 26 0 - 37 U/L   ALT 23 0 - 53 U/L   Alkaline Phosphatase 98 39 - 117 U/L   Total Bilirubin 0.9 0.3 - 1.2 mg/dL   GFR calc non Af Amer 27 (L) >90 mL/min   GFR calc Af Amer 31 (L) >90 mL/min    Comment: (NOTE) The eGFR has been calculated using the CKD EPI equation. This calculation has not been validated in all clinical situations. eGFR's persistently <90 mL/min signify possible Chronic Kidney Disease.    Anion gap 8 5 - 15  Troponin I     Status:  Abnormal   Collection Time: 12/08/14 12:11 PM  Result Value Ref Range   Troponin I 0.06 (H) <0.031 ng/mL    Comment:        PERSISTENTLY INCREASED TROPONIN VALUES IN THE RANGE OF 0.04-0.49 ng/mL CAN BE SEEN IN:       -UNSTABLE ANGINA       -CONGESTIVE HEART FAILURE       -MYOCARDITIS       -CHEST TRAUMA       -ARRYHTHMIAS       -LATE PRESENTING MYOCARDIAL INFARCTION       -COPD   CLINICAL FOLLOW-UP RECOMMENDED.   Brain natriuretic peptide     Status: Abnormal   Collection Time: 12/08/14 12:11 PM  Result Value Ref Range   B Natriuretic Peptide 800.1 (H) 0.0 - 100.0 pg/mL  TSH     Status: None   Collection Time: 12/08/14  3:01 PM  Result Value Ref Range   TSH 2.900 0.350 - 4.500 uIU/mL  Reticulocytes     Status: Abnormal   Collection Time: 12/08/14  3:01 PM  Result Value Ref Range   Retic Ct Pct 1.8 0.4 - 3.1 %   RBC. 3.36 (L) 4.22 - 5.81 MIL/uL   Retic Count, Manual 60.5 19.0 - 186.0 K/uL    Dg Chest 2 View  12/08/2014   CLINICAL DATA:  Status post fall this morning with a left hip fracture.  EXAM: CHEST  2 VIEW  COMPARISON:  PA and lateral chest 10/19/2013 and 05/17/2013.  FINDINGS: There is cardiomegaly and mild interstitial edema with small bilateral pleural effusions. No pneumothorax is identified. No focal bony abnormality is seen.  IMPRESSION: Mild interstitial edema with associated small bilateral pleural effusions.   Electronically Signed   By: Inge Rise M.D.   On: 12/08/2014 13:25   Dg Hip Unilat With Pelvis 2-3 Views Left  12/08/2014   CLINICAL DATA:  Status post fall this morning with left hip pain. Initial encounter.  EXAM: LEFT HIP (WITH PELVIS) 2-3 VIEWS  COMPARISON:  None.  FINDINGS: Acute subcapital fracture of the left hip is identified.  No other acute bony or joint abnormality is seen. Bones are osteopenic. Atherosclerosis is noted.  IMPRESSION: Acute subcapital fracture left hip.   Electronically Signed   By: Inge Rise M.D.   On: 12/08/2014 13:24     ROS Blood pressure 173/87, pulse 111, temperature 97.6 F (36.4 C), temperature source Oral, resp. rate 29, SpO2 91 %. Physical Exam  Musculoskeletal:       Left hip: He exhibits decreased range of motion, decreased strength, tenderness, bony tenderness and deformity.   He has significant venous stasis changes on both his legs and quite severe peripheral edema   Assessment/Plan: Left hip with displaced femoral neck fracture 1)  I have spoken to the patient and his family in length.  He did get around with a walker prior to his fall.  His left hip is now significantly painful and they understand that without surgery, he will be bed bound.  However, he currently has significant pulmonary edema as well has significant cardiac risk factors that make him a significant high risk surgical candidate.  I will place him on the OR schedule for tomorrow afternoon 12/08/14 and the family will discuss this further prior to making a final decision about surgery.  Again, he and his family understand the risks of non-operative as well as operative treatment and that this decision is very difficult given the possibly outcomes of either direction that is taken.   Maddilyn Campus Y 12/08/2014, 6:18 PM

## 2014-12-09 ENCOUNTER — Inpatient Hospital Stay (HOSPITAL_COMMUNITY): Payer: Medicare Other

## 2014-12-09 ENCOUNTER — Inpatient Hospital Stay (HOSPITAL_COMMUNITY): Payer: Medicare Other | Admitting: Certified Registered Nurse Anesthetist

## 2014-12-09 ENCOUNTER — Encounter (HOSPITAL_COMMUNITY): Payer: Self-pay | Admitting: Anesthesiology

## 2014-12-09 ENCOUNTER — Encounter (HOSPITAL_COMMUNITY)
Admission: EM | Disposition: A | Discharge status: Skilled Nursing Facility | Payer: Self-pay | Source: Home / Self Care | Attending: Internal Medicine

## 2014-12-09 DIAGNOSIS — I10 Essential (primary) hypertension: Secondary | ICD-10-CM

## 2014-12-09 DIAGNOSIS — J9600 Acute respiratory failure, unspecified whether with hypoxia or hypercapnia: Secondary | ICD-10-CM

## 2014-12-09 DIAGNOSIS — I5033 Acute on chronic diastolic (congestive) heart failure: Secondary | ICD-10-CM

## 2014-12-09 DIAGNOSIS — I25810 Atherosclerosis of coronary artery bypass graft(s) without angina pectoris: Secondary | ICD-10-CM

## 2014-12-09 DIAGNOSIS — I482 Chronic atrial fibrillation: Secondary | ICD-10-CM

## 2014-12-09 DIAGNOSIS — I480 Paroxysmal atrial fibrillation: Secondary | ICD-10-CM

## 2014-12-09 DIAGNOSIS — I248 Other forms of acute ischemic heart disease: Secondary | ICD-10-CM

## 2014-12-09 DIAGNOSIS — N183 Chronic kidney disease, stage 3 (moderate): Secondary | ICD-10-CM

## 2014-12-09 DIAGNOSIS — S72002A Fracture of unspecified part of neck of left femur, initial encounter for closed fracture: Secondary | ICD-10-CM

## 2014-12-09 DIAGNOSIS — Z01810 Encounter for preprocedural cardiovascular examination: Secondary | ICD-10-CM | POA: Diagnosis present

## 2014-12-09 HISTORY — PX: HIP ARTHROPLASTY: SHX981

## 2014-12-09 LAB — POCT I-STAT 3, ART BLOOD GAS (G3+)
ACID-BASE DEFICIT: 9 mmol/L — AB (ref 0.0–2.0)
Bicarbonate: 17.9 mEq/L — ABNORMAL LOW (ref 20.0–24.0)
O2 Saturation: 99 %
PCO2 ART: 41.8 mmHg (ref 35.0–45.0)
Patient temperature: 97.1
TCO2: 19 mmol/L (ref 0–100)
pH, Arterial: 7.236 — ABNORMAL LOW (ref 7.350–7.450)
pO2, Arterial: 135 mmHg — ABNORMAL HIGH (ref 80.0–100.0)

## 2014-12-09 LAB — FOLATE: Folate: >20 ng/mL

## 2014-12-09 LAB — CBC
HCT: 31 % — ABNORMAL LOW (ref 39.0–52.0)
Hemoglobin: 9.7 g/dL — ABNORMAL LOW (ref 13.0–17.0)
MCH: 27.9 pg (ref 26.0–34.0)
MCHC: 31.3 g/dL (ref 30.0–36.0)
MCV: 89.1 fL (ref 78.0–100.0)
PLATELETS: 144 10*3/uL — AB (ref 150–400)
RBC: 3.48 MIL/uL — ABNORMAL LOW (ref 4.22–5.81)
RDW: 18.1 % — ABNORMAL HIGH (ref 11.5–15.5)
WBC: 8.2 10*3/uL (ref 4.0–10.5)

## 2014-12-09 LAB — COMPREHENSIVE METABOLIC PANEL
ALBUMIN: 2.6 g/dL — AB (ref 3.5–5.2)
ALT: 25 U/L (ref 0–53)
AST: 37 U/L (ref 0–37)
Alkaline Phosphatase: 90 U/L (ref 39–117)
Anion gap: 10 (ref 5–15)
BUN: 41 mg/dL — AB (ref 6–23)
CALCIUM: 8.8 mg/dL (ref 8.4–10.5)
CHLORIDE: 110 mmol/L (ref 96–112)
CO2: 25 mmol/L (ref 19–32)
CREATININE: 2.58 mg/dL — AB (ref 0.50–1.35)
GFR calc Af Amer: 23 mL/min — ABNORMAL LOW (ref >90)
GFR calc non Af Amer: 20 mL/min — ABNORMAL LOW (ref >90)
Glucose, Bld: 118 mg/dL — ABNORMAL HIGH (ref 70–99)
Potassium: 4.2 mmol/L (ref 3.5–5.1)
Sodium: 145 mmol/L (ref 135–145)
TOTAL PROTEIN: 7 g/dL (ref 6.0–8.3)
Total Bilirubin: 1.7 mg/dL — ABNORMAL HIGH (ref 0.3–1.2)

## 2014-12-09 LAB — CG4 I-STAT (LACTIC ACID): Lactic Acid, Venous: 4.58 mmol/L (ref 0.5–2.0)

## 2014-12-09 LAB — URINALYSIS, ROUTINE W REFLEX MICROSCOPIC
Bilirubin Urine: NEGATIVE
GLUCOSE, UA: NEGATIVE mg/dL
KETONES UR: NEGATIVE mg/dL
Nitrite: POSITIVE — AB
Protein, ur: >300 mg/dL — AB
Specific Gravity, Urine: 1.012 (ref 1.005–1.030)
UROBILINOGEN UA: 0.2 mg/dL (ref 0.0–1.0)
pH: 5 (ref 5.0–8.0)

## 2014-12-09 LAB — URINE MICROSCOPIC-ADD ON

## 2014-12-09 LAB — FERRITIN: Ferritin: 183 ng/mL (ref 22–322)

## 2014-12-09 LAB — TROPONIN I
TROPONIN I: 0.45 ng/mL — AB (ref <0.031)
TROPONIN I: 1.35 ng/mL — AB (ref <0.031)
Troponin I: 1.21 ng/mL (ref <0.031)

## 2014-12-09 LAB — LACTIC ACID, PLASMA: Lactic Acid, Venous: 2.2 mmol/L (ref 0.5–2.0)

## 2014-12-09 LAB — IRON AND TIBC
Iron: 38 ug/dL — ABNORMAL LOW (ref 42–165)
Saturation Ratios: 15 % — ABNORMAL LOW (ref 20–55)
TIBC: 250 ug/dL (ref 215–435)
UIBC: 212 ug/dL (ref 125–400)

## 2014-12-09 LAB — VITAMIN B12: VITAMIN B 12: 668 pg/mL (ref 211–911)

## 2014-12-09 SURGERY — HEMIARTHROPLASTY, HIP, DIRECT ANTERIOR APPROACH, FOR FRACTURE
Anesthesia: General | Site: Hip | Laterality: Left

## 2014-12-09 MED ORDER — FENTANYL CITRATE 0.05 MG/ML IJ SOLN: 50.0000 ug | INTRAMUSCULAR | Status: DC | PRN

## 2014-12-09 MED ORDER — ONDANSETRON HCL 4 MG PO TABS: 4.0000 mg | ORAL_TABLET | Freq: Four times a day (QID) | ORAL | Status: DC | PRN

## 2014-12-09 MED ORDER — ONDANSETRON HCL 4 MG/2ML IJ SOLN: 4.0000 mg | Freq: Once | INTRAMUSCULAR | Status: DC | PRN

## 2014-12-09 MED ORDER — CIPROFLOXACIN IN D5W 200 MG/100ML IV SOLN
200.0000 mg | INTRAVENOUS | Status: DC
  Administered 2014-12-09 – 2014-12-10 (×2): 200 mg via INTRAVENOUS
  Filled 2014-12-09 (×3): qty 100

## 2014-12-09 MED ORDER — LEVOTHYROXINE SODIUM 75 MCG PO TABS: 75.0000 ug | ORAL_TABLET | Freq: Once | ORAL | Status: DC

## 2014-12-09 MED ORDER — ONDANSETRON HCL 4 MG/2ML IJ SOLN: 4.0000 mg | Freq: Four times a day (QID) | INTRAMUSCULAR | Status: DC | PRN

## 2014-12-09 MED ORDER — ROCURONIUM BROMIDE 100 MG/10ML IV SOLN
INTRAVENOUS | Status: DC | PRN
  Administered 2014-12-09: 50 mg via INTRAVENOUS

## 2014-12-09 MED ORDER — ALBUTEROL SULFATE HFA 108 (90 BASE) MCG/ACT IN AERS
2.0000 | INHALATION_SPRAY | Freq: Once | RESPIRATORY_TRACT | Status: AC
  Administered 2014-12-09: 2 via RESPIRATORY_TRACT
  Filled 2014-12-09: qty 6.7

## 2014-12-09 MED ORDER — LACTATED RINGERS IV SOLN
INTRAVENOUS | Status: DC | PRN
  Administered 2014-12-09 (×2): via INTRAVENOUS

## 2014-12-09 MED ORDER — PROPOFOL 10 MG/ML IV BOLUS
INTRAVENOUS | Status: AC
  Filled 2014-12-09: qty 20

## 2014-12-09 MED ORDER — SODIUM CHLORIDE 0.9 % IR SOLN
Status: DC | PRN
  Administered 2014-12-09: 1000 mL

## 2014-12-09 MED ORDER — PHENYLEPHRINE HCL 10 MG/ML IJ SOLN
10.0000 mg | INTRAVENOUS | Status: DC | PRN
  Administered 2014-12-09: 40 ug/min via INTRAVENOUS

## 2014-12-09 MED ORDER — PANTOPRAZOLE SODIUM 40 MG IV SOLR
40.0000 mg | Freq: Every day | INTRAVENOUS | Status: DC
  Administered 2014-12-10 – 2014-12-11 (×2): 40 mg via INTRAVENOUS
  Filled 2014-12-09 (×2): qty 40

## 2014-12-09 MED ORDER — FENTANYL CITRATE 0.05 MG/ML IJ SOLN
INTRAMUSCULAR | Status: AC
  Filled 2014-12-09: qty 5

## 2014-12-09 MED ORDER — HYDROMORPHONE HCL 1 MG/ML IJ SOLN: 0.2500 mg | INTRAMUSCULAR | Status: DC | PRN

## 2014-12-09 MED ORDER — LEVALBUTEROL HCL 0.63 MG/3ML IN NEBU
0.6300 mg | INHALATION_SOLUTION | RESPIRATORY_TRACT | Status: DC | PRN
  Administered 2014-12-13: 0.63 mg via RESPIRATORY_TRACT
  Filled 2014-12-09: qty 3

## 2014-12-09 MED ORDER — CLINDAMYCIN PHOSPHATE 600 MG/50ML IV SOLN
600.0000 mg | Freq: Four times a day (QID) | INTRAVENOUS | Status: AC
  Administered 2014-12-09 – 2014-12-10 (×2): 600 mg via INTRAVENOUS
  Filled 2014-12-09 (×2): qty 50

## 2014-12-09 MED ORDER — LIDOCAINE HCL (CARDIAC) 20 MG/ML IV SOLN
INTRAVENOUS | Status: DC | PRN
  Administered 2014-12-09: 80 mg via INTRAVENOUS

## 2014-12-09 MED ORDER — ACETAMINOPHEN 325 MG PO TABS: 650.0000 mg | ORAL_TABLET | Freq: Four times a day (QID) | ORAL | Status: DC | PRN

## 2014-12-09 MED ORDER — ALBUMIN HUMAN 5 % IV SOLN
INTRAVENOUS | Status: DC | PRN
  Administered 2014-12-09: 16:00:00 via INTRAVENOUS

## 2014-12-09 MED ORDER — CHLORHEXIDINE GLUCONATE 0.12 % MT SOLN
15.0000 mL | Freq: Two times a day (BID) | OROMUCOSAL | Status: DC
  Administered 2014-12-09 – 2014-12-12 (×6): 15 mL via OROMUCOSAL
  Filled 2014-12-09 (×6): qty 15

## 2014-12-09 MED ORDER — MENTHOL 3 MG MT LOZG
1.0000 | LOZENGE | OROMUCOSAL | Status: DC | PRN
  Filled 2014-12-09: qty 9

## 2014-12-09 MED ORDER — EPINEPHRINE HCL 0.1 MG/ML IJ SOSY
PREFILLED_SYRINGE | INTRAMUSCULAR | Status: DC | PRN
  Administered 2014-12-09: 100 ug via INTRAVENOUS
  Administered 2014-12-09: 50 ug via INTRAVENOUS
  Administered 2014-12-09 (×3): 500 ug via INTRAVENOUS

## 2014-12-09 MED ORDER — PROPOFOL 10 MG/ML IV BOLUS
INTRAVENOUS | Status: DC | PRN
  Administered 2014-12-09: 100 mg via INTRAVENOUS

## 2014-12-09 MED ORDER — CLINDAMYCIN PHOSPHATE 900 MG/50ML IV SOLN
900.0000 mg | Freq: Once | INTRAVENOUS | Status: AC
  Administered 2014-12-09: 900 mg via INTRAVENOUS
  Filled 2014-12-09: qty 50

## 2014-12-09 MED ORDER — PHENYLEPHRINE HCL 10 MG/ML IJ SOLN
INTRAMUSCULAR | Status: DC | PRN
  Administered 2014-12-09 (×2): 120 ug via INTRAVENOUS
  Administered 2014-12-09: 80 ug via INTRAVENOUS
  Administered 2014-12-09 (×2): 160 ug via INTRAVENOUS
  Administered 2014-12-09: 120 ug via INTRAVENOUS
  Administered 2014-12-09: 160 ug via INTRAVENOUS
  Administered 2014-12-09: 200 ug via INTRAVENOUS

## 2014-12-09 MED ORDER — EPHEDRINE SULFATE 50 MG/ML IJ SOLN
INTRAMUSCULAR | Status: DC | PRN
  Administered 2014-12-09 (×2): 10 mg via INTRAVENOUS
  Administered 2014-12-09: 20 mg via INTRAVENOUS
  Administered 2014-12-09 (×2): 10 mg via INTRAVENOUS

## 2014-12-09 MED ORDER — MORPHINE SULFATE 2 MG/ML IJ SOLN: 0.5000 mg | INTRAMUSCULAR | Status: DC | PRN

## 2014-12-09 MED ORDER — FENTANYL BOLUS VIA INFUSION
25.0000 ug | INTRAVENOUS | Status: DC | PRN
  Filled 2014-12-09: qty 25

## 2014-12-09 MED ORDER — FENTANYL CITRATE 0.05 MG/ML IJ SOLN
INTRAMUSCULAR | Status: DC | PRN
  Administered 2014-12-09: 50 ug via INTRAVENOUS

## 2014-12-09 MED ORDER — HYDROCODONE-ACETAMINOPHEN 5-325 MG PO TABS: 1.0000 | ORAL_TABLET | Freq: Four times a day (QID) | ORAL | Status: DC | PRN

## 2014-12-09 MED ORDER — CETYLPYRIDINIUM CHLORIDE 0.05 % MT LIQD
7.0000 mL | Freq: Four times a day (QID) | OROMUCOSAL | Status: DC
  Administered 2014-12-10 – 2014-12-12 (×10): 7 mL via OROMUCOSAL

## 2014-12-09 MED ORDER — SUCCINYLCHOLINE CHLORIDE 20 MG/ML IJ SOLN
INTRAMUSCULAR | Status: DC | PRN
  Administered 2014-12-09: 100 mg via INTRAVENOUS

## 2014-12-09 MED ORDER — ACETAMINOPHEN 650 MG RE SUPP: 650.0000 mg | Freq: Four times a day (QID) | RECTAL | Status: DC | PRN

## 2014-12-09 MED ORDER — FENTANYL CITRATE 0.05 MG/ML IJ SOLN
50.0000 ug | INTRAMUSCULAR | Status: DC | PRN
  Administered 2014-12-09: 50 ug via INTRAVENOUS
  Filled 2014-12-09: qty 2

## 2014-12-09 MED ORDER — METOCLOPRAMIDE HCL 5 MG/ML IJ SOLN
5.0000 mg | Freq: Three times a day (TID) | INTRAMUSCULAR | Status: DC | PRN
Start: 2014-12-09 — End: 2014-12-09

## 2014-12-09 MED ORDER — MIDAZOLAM HCL 2 MG/2ML IJ SOLN
1.0000 mg | INTRAMUSCULAR | Status: DC | PRN
Start: 2014-12-09 — End: 2014-12-12

## 2014-12-09 MED ORDER — METOCLOPRAMIDE HCL 5 MG PO TABS
5.0000 mg | ORAL_TABLET | Freq: Three times a day (TID) | ORAL | Status: DC | PRN
  Filled 2014-12-09: qty 2

## 2014-12-09 MED ORDER — EPINEPHRINE HCL 1 MG/ML IJ SOLN
0.5000 ug/min | INTRAMUSCULAR | Status: DC
  Filled 2014-12-09: qty 4

## 2014-12-09 MED ORDER — DEXMEDETOMIDINE HCL IN NACL 200 MCG/50ML IV SOLN
INTRAVENOUS | Status: DC | PRN
  Administered 2014-12-09: .7 ug/kg/h via INTRAVENOUS

## 2014-12-09 MED ORDER — CIPROFLOXACIN HCL 500 MG PO TABS
500.0000 mg | ORAL_TABLET | ORAL | Status: DC
  Filled 2014-12-09: qty 1

## 2014-12-09 MED ORDER — PHENOL 1.4 % MT LIQD
1.0000 | OROMUCOSAL | Status: DC | PRN
  Filled 2014-12-09: qty 177

## 2014-12-09 MED ORDER — EPINEPHRINE HCL 1 MG/ML IJ SOLN
4000.0000 ug | INTRAVENOUS | Status: DC | PRN
  Administered 2014-12-09: 20 ug/min via INTRAVENOUS

## 2014-12-09 MED ORDER — SODIUM CHLORIDE 0.9 % IV SOLN
25.0000 ug/h | INTRAVENOUS | Status: DC
  Administered 2014-12-09: 50 ug/h via INTRAVENOUS
  Administered 2014-12-11: 25 ug/h via INTRAVENOUS
  Filled 2014-12-09 (×2): qty 50

## 2014-12-09 MED ORDER — LEVOTHYROXINE SODIUM 75 MCG PO TABS
75.0000 ug | ORAL_TABLET | Freq: Every day | ORAL | Status: DC
  Administered 2014-12-10 – 2014-12-21 (×11): 75 ug via ORAL
  Filled 2014-12-09 (×13): qty 1

## 2014-12-09 MED ORDER — FENTANYL CITRATE 0.05 MG/ML IJ SOLN: 50.0000 ug | Freq: Once | INTRAMUSCULAR | Status: DC

## 2014-12-09 SURGICAL SUPPLY — 41 items
BLADE SAGITTAL (BLADE) ×3
BLADE SAW THK.89X75X18XSGTL (BLADE) ×1 IMPLANT
CAPT HIP HEMI 1 ×2 IMPLANT
COVER SURGICAL LIGHT HANDLE (MISCELLANEOUS) ×3 IMPLANT
DRAPE HIP W/POCKET STRL (DRAPE) ×5 IMPLANT
DRAPE IMP U-DRAPE 54X76 (DRAPES) ×3 IMPLANT
DRAPE INCISE IOBAN 85X60 (DRAPES) ×6 IMPLANT
DRAPE U-SHAPE 47X51 STRL (DRAPES) ×5 IMPLANT
DRSG AQUACEL AG ADV 3.5X10 (GAUZE/BANDAGES/DRESSINGS) ×2 IMPLANT
DRSG MEPILEX BORDER 4X8 (GAUZE/BANDAGES/DRESSINGS) ×3 IMPLANT
DURAPREP 26ML APPLICATOR (WOUND CARE) ×3 IMPLANT
ELECT BLADE 6.5 EXT (BLADE) IMPLANT
ELECT CAUTERY BLADE 6.4 (BLADE) ×3 IMPLANT
ELECT REM PT RETURN 9FT ADLT (ELECTROSURGICAL) ×3
ELECTRODE REM PT RTRN 9FT ADLT (ELECTROSURGICAL) ×1 IMPLANT
EVACUATOR 1/8 PVC DRAIN (DRAIN) IMPLANT
GLOVE BIOGEL PI IND STRL 7.5 (GLOVE) ×1 IMPLANT
GLOVE BIOGEL PI IND STRL 8 (GLOVE) ×1 IMPLANT
GLOVE BIOGEL PI INDICATOR 7.5 (GLOVE) ×2
GLOVE BIOGEL PI INDICATOR 8 (GLOVE) ×2
GLOVE ORTHO TXT STRL SZ7.5 (GLOVE) ×6 IMPLANT
GOWN STRL REUS W/ TWL LRG LVL3 (GOWN DISPOSABLE) ×2 IMPLANT
GOWN STRL REUS W/ TWL XL LVL3 (GOWN DISPOSABLE) ×4 IMPLANT
GOWN STRL REUS W/TWL LRG LVL3 (GOWN DISPOSABLE) ×6
GOWN STRL REUS W/TWL XL LVL3 (GOWN DISPOSABLE) ×12
KIT BASIN OR (CUSTOM PROCEDURE TRAY) ×3 IMPLANT
KIT ROOM TURNOVER OR (KITS) ×3 IMPLANT
MANIFOLD NEPTUNE II (INSTRUMENTS) ×3 IMPLANT
NS IRRIG 1000ML POUR BTL (IV SOLUTION) ×3 IMPLANT
PACK TOTAL JOINT (CUSTOM PROCEDURE TRAY) ×3 IMPLANT
PACK UNIVERSAL I (CUSTOM PROCEDURE TRAY) ×3 IMPLANT
PAD ARMBOARD 7.5X6 YLW CONV (MISCELLANEOUS) ×6 IMPLANT
STAPLER VISISTAT 35W (STAPLE) ×3 IMPLANT
SUT ETHIBOND NAB CT1 #1 30IN (SUTURE) ×6 IMPLANT
SUT VIC AB 1 CTB1 27 (SUTURE) ×6 IMPLANT
SUT VIC AB 2-0 CT1 27 (SUTURE) ×6
SUT VIC AB 2-0 CT1 TAPERPNT 27 (SUTURE) ×2 IMPLANT
TOWEL OR 17X24 6PK STRL BLUE (TOWEL DISPOSABLE) ×3 IMPLANT
TOWEL OR 17X26 10 PK STRL BLUE (TOWEL DISPOSABLE) ×3 IMPLANT
TRAY FOLEY CATH 16FRSI W/METER (SET/KITS/TRAYS/PACK) IMPLANT
WATER STERILE IRR 1000ML POUR (IV SOLUTION) ×9 IMPLANT

## 2014-12-09 NOTE — Progress Notes (Signed)
eLink Physician-Brief Progress Note Patient Name: [name] [name] DOB: 04-01-1921 MRN: [id number]   Date of Service  12/09/2014  HPI/Events of Note  S/p left hip hemi-arthro Post op hypotension requiring epi AKI on CKD, pos trops am of surgery  eICU Interventions  Vent/sedation orders Hold losartan/ metoprolol/ lasix Rpt CBC, EKG, trops   New ICU patient evaluation: This patient was evaluated by the [facility] team. I have reviewed relevant documentation including care plan & orders.   Intervention Category Evaluation Type: New Patient Evaluation  Naksh Radi V. 12/09/2014, 6:11 PM

## 2014-12-09 NOTE — Progress Notes (Signed)
ANTIBIOTIC CONSULT NOTE - INITIAL  Pharmacy Consult for Cipro Indication: UTI  Allergies  Allergen Reactions  . Ancef [Cefazolin] Anaphylaxis  . Ace Inhibitors Cough  . Penicillins Cross Reactors     Anaphylaxix/don't give anything in the "family" of penicillins  . Sulfa Antibiotics     Don't remember. Didn't want to give me anymore    Patient Measurements:    Vital Signs: Temp: 97.6 F (36.4 C) (02/09 0530) Temp Source: Oral (02/09 0530) BP: 120/66 mmHg (02/09 0530) Pulse Rate: 82 (02/09 0530) Intake/Output from previous day:   Intake/Output from this shift:    Labs:  Recent Labs  12/08/14 1211 12/09/14 0654  WBC 8.6 8.2  HGB 8.7* 9.7*  PLT 157 144*  CREATININE 2.03* 2.58*   CrCl cannot be calculated (Unknown ideal weight.). No results for input(s): VANCOTROUGH, VANCOPEAK, VANCORANDOM, GENTTROUGH, GENTPEAK, GENTRANDOM, TOBRATROUGH, TOBRAPEAK, TOBRARND, AMIKACINPEAK, AMIKACINTROU, AMIKACIN in the last 72 hours.   Microbiology: No results found for this or any previous visit (from the past 720 hour(s)).  Medical History: Past Medical History  Diagnosis Date  . Hypertension   . History of prostate cancer 1996    treated with radiation  . Arthritis   . Gout, arthritis 2010    bil feet  . Cancer     PORSTATE-HX OF RADIATION AND SURGERY  . Diverticulosis of colon   . CAD (coronary artery disease)     cabg 1996; echo 03/15/2007-EF 45-50%, LA mod to severe dilated, mod TR, mod pulmonary HTN, mod sclerotic aortic valve; myoview6/23/2010- no ischemia  . A-fib     chronic, no longer on coumadin due to rectal bleeding  . Hyperlipidemia   . Rectal bleed   . CHF (congestive heart failure) 10/18/13    Medications:  Prescriptions prior to admission  Medication Sig Dispense Refill Last Dose  . acetaminophen (TYLENOL) 325 MG tablet Take 2 tablets (650 mg total) by mouth every 6 (six) hours as needed.   12/07/2014 at Unknown time  . atorvastatin (LIPITOR) 10 MG  tablet Take 10 mg by mouth daily.   12/07/2014 at Unknown time  . budesonide (ENTOCORT EC) 3 MG 24 hr capsule Take 9 mg by mouth daily.  3 12/08/2014 at Unknown time  . calcium carbonate (TUMS - DOSED IN MG ELEMENTAL CALCIUM) 500 MG chewable tablet Chew 1 tablet by mouth at bedtime.    12/07/2014 at Unknown time  . febuxostat (ULORIC) 40 MG tablet Take 1 tablet (40 mg total) by mouth daily. 30 tablet 6 12/07/2014 at Unknown time  . furosemide (LASIX) 20 MG tablet Take 20 mg by mouth daily.   12/08/2014 at Unknown time  . gabapentin (NEURONTIN) 100 MG capsule Take 100 mg by mouth 3 (three) times daily.   12/08/2014 at Unknown time  . levothyroxine (SYNTHROID, LEVOTHROID) 75 MCG tablet Take 75 mcg by mouth daily.  6 12/08/2014 at Unknown time  . losartan (COZAAR) 100 MG tablet Take 50 mg by mouth daily.   12/07/2014 at Unknown time  . metoprolol (LOPRESSOR) 50 MG tablet Take 50 mg by mouth 2 (two) times daily.     12/08/2014 at 0700  . primidone (MYSOLINE) 50 MG tablet Take 25-50 mg by mouth See admin instructions. Take 0.5 tablets by mouth at bedtime for 1 week then increase to 1 tablet at bedtime   12/07/2014 at Unknown time  . levofloxacin (LEVAQUIN) 250 MG tablet Take 1 tablet at noon on 12/25 then stop (Patient not taking: Reported on 12/08/2014) 1  tablet 0 Completed Course at Unknown time  . levothyroxine (SYNTHROID, LEVOTHROID) 25 MCG tablet Take 1 tablet (25 mcg total) by mouth daily before breakfast. 30 tablet 2 duplicate   Assessment: 81 y/o male who sustained a closed L hip fracture at home after a mechanical fall. Pharmacy consulted to begin Cipro for a UTI. Patient has anaphylactic allergy to PCN. He is afebrile and WBC are normal. He has CKD with SCr of 2.58. UA is positive. May go for hip surgery today. Last dose of Cipro was 2/8 at 20:03.  Goal of Therapy:  Eradication of infection  Plan:  - Cipro 500 mg PO q24h - Recommend 5 - 7 days of therapy - Monitor renal function and culture data  [facility], Pharm.D., BCPS Clinical Pharmacist Pager: [phone] 12/09/2014 9:50 AM

## 2014-12-09 NOTE — Consult Note (Signed)
PULMONARY / CRITICAL CARE MEDICINE   Name: [name] [name] MRN: [id number] DOB: [birth year]    ADMISSION DATE:  12/08/2014 CONSULTATION DATE:  12/09/2014  Service provided 12/10/14  REFERRING MD :  [name]  CHIEF COMPLAINT:  Left femoral fx s/p left hemiarthroplasty 2/9, back to ICU on vent  INITIAL PRESENTATION:  81 y.o. M who sustained left hip fx on 2/8 after mechanical fall at home.  Taken to OR 2/9 for left hip hemiarthroplasty and following OR, returned to ICU on vent.  PCCM consulted for vent management.  STUDIES:  L Hip X Ray 2/8 >>> acute subcapital fx left hip. CXR 2/8 >>> mild interstitial edema with associated small b/l pleural effusions  SIGNIFICANT EVENTS: 2/8 - admitted with L hip fx 2/9 - left hip hemiarthroplasty [name]).  Returned to OR on vent, PCCM consulted.   HISTORY OF PRESENT ILLNESS:   [name] [name] is a 81 y.o. M with PMH as outlined below.  He resides at home but has home health assistance.  On 2/8 after he took a bath, he unfortunately slipped and fell onto his left hip.  EMS was dispatched and on ED arrival, he was found to have closed fracture of the left hip.  Ortho was consulted and per discussions with family, decision was made to proceed with surgical fixation which was done PM of 2/9 (left hip hemiarthroplasty).  Following the procedure, pt returned to the ICU on the vent and PCCM was consulted.  He apparently has been non-compliant with his outpatient diuretic regimen due to incontinence.  He has also recently developed progressive hypoxic respiratory failure and has been requiring home O2.  PAST MEDICAL HISTORY :   has a past medical history of Hypertension; History of prostate cancer (1996); Arthritis; Gout, arthritis (2010); Cancer; Diverticulosis of colon; CAD (coronary artery disease); A-fib; Hyperlipidemia; Rectal bleed; and CHF (congestive heart failure) (10/18/13).  has past surgical history that includes Coronary artery bypass graft (04/1995);  Appendectomy (1950s); Eye surgery (2009); CRYOABLATION OF PROSTATE (06/23/2005); Colonoscopy (11/03/2000); Cardiac catheterization (05/04/2005); and Cardiac catheterization (04/10/1995). Prior to Admission medications   Medication Sig Start Date End Date Taking? Authorizing Provider  acetaminophen (TYLENOL) 325 MG tablet Take 2 tablets (650 mg total) by mouth every 6 (six) hours as needed. 04/18/13  Yes [name], MD  atorvastatin (LIPITOR) 10 MG tablet Take 10 mg by mouth daily.   Yes Historical Provider, MD  budesonide (ENTOCORT EC) 3 MG 24 hr capsule Take 9 mg by mouth daily. 10/30/14  Yes Historical Provider, MD  calcium carbonate (TUMS - DOSED IN MG ELEMENTAL CALCIUM) 500 MG chewable tablet Chew 1 tablet by mouth at bedtime.    Yes Historical Provider, MD  febuxostat (ULORIC) 40 MG tablet Take 1 tablet (40 mg total) by mouth daily. 10/22/13  Yes [name], MD  furosemide (LASIX) 20 MG tablet Take 20 mg by mouth daily.   Yes Historical Provider, MD  gabapentin (NEURONTIN) 100 MG capsule Take 100 mg by mouth 3 (three) times daily.   Yes Historical Provider, MD  levothyroxine (SYNTHROID, LEVOTHROID) 75 MCG tablet Take 75 mcg by mouth daily. 10/29/14  Yes Historical Provider, MD  losartan (COZAAR) 100 MG tablet Take 50 mg by mouth daily.   Yes Historical Provider, MD  metoprolol (LOPRESSOR) 50 MG tablet Take 50 mg by mouth 2 (two) times daily.     Yes Historical Provider, MD  primidone (MYSOLINE) 50 MG tablet Take 25-50 mg by mouth See admin instructions. Take  0.5 tablets by mouth at bedtime for 1 week then increase to 1 tablet at bedtime   Yes Historical Provider, MD  levofloxacin (LEVAQUIN) 250 MG tablet Take 1 tablet at noon on 12/25 then stop Patient not taking: Reported on 12/08/2014 10/23/13   Velna Hatchet, MD  levothyroxine (SYNTHROID, LEVOTHROID) 25 MCG tablet Take 1 tablet (25 mcg total) by mouth daily before breakfast. 10/23/13   Velna Hatchet, MD   Allergies  Allergen Reactions  .  Ancef [Cefazolin] Anaphylaxis  . Ace Inhibitors Cough  . Penicillins Cross Reactors     Anaphylaxix/don't give anything in the "family" of penicillins  . Sulfa Antibiotics     Don't remember. Didn't want to give me anymore    FAMILY HISTORY:  Family History  Problem Relation Age of Onset  . Heart Problems Mother   . Heart Problems Father   . Cancer Brother   . Cancer Sister     SOCIAL HISTORY:  reports that he quit smoking about 30 years ago. He has never used smokeless tobacco. He reports that he does not drink alcohol or use illicit drugs.  REVIEW OF SYSTEMS:  Unable due to intubation  SUBJECTIVE: remains vented, no pressors  VITAL SIGNS: Temp:  [97.1 F (36.2 C)-98.4 F (36.9 C)] 97.9 F (36.6 C) (02/09 1943) Pulse Rate:  [65-110] 85 (02/09 1915) Resp:  [18-24] 20 (02/09 1915) BP: (110-142)/(53-107) 129/56 mmHg (02/09 1915) SpO2:  [91 %-100 %] 100 % (02/09 1915) Arterial Line BP: (133-162)/(73-89) 140/74 mmHg (02/09 1915) FiO2 (%):  [60 %] 60 % (02/09 1725) Weight:  [90.447 kg (199 lb 6.4 oz)] 90.447 kg (199 lb 6.4 oz) (02/09 1049) HEMODYNAMICS:   VENTILATOR SETTINGS: Vent Mode:  [-] PRVC FiO2 (%):  [60 %] 60 % Set Rate:  [16 bmp] 16 bmp Vt Set:  [550 mL] 550 mL PEEP:  [5 cmH20] 5 cmH20 Plateau Pressure:  [19 cmH20] 19 cmH20 INTAKE / OUTPUT: Intake/Output      02/09 0701 - 02/10 0700   I.V. (mL/kg) 1012.2 (11.2)   IV Piggyback 250   Total Intake(mL/kg) 1262.2 (14)   Blood 300   Total Output 300   Net +962.2         PHYSICAL EXAMINATION: General: Elderly male, chronically ill appearing, resting in bed, in NAD. Neuro: Sedated on vent. HEENT: [place]/AT. PERRL, sclerae anicteric. Cardiovascular: RRR, no M/R/G.  Lungs: Respirations even and unlabored.  Coarse bilaterally. Abdomen: BS x 4, soft, NT/ND.  Musculoskeletal: No gross deformities, 1+ LE pitting edema.  Skin: Intact, warm, no rashes.   LABS:  CBC  Recent Labs Lab 12/08/14 1211  12/09/14 0654  WBC 8.6 8.2  HGB 8.7* 9.7*  HCT 28.1* 31.0*  PLT 157 144*   Coag's No results for input(s): APTT, INR in the last 168 hours. BMET  Recent Labs Lab 12/08/14 1211 12/09/14 0654  NA 145 145  K 3.7 4.2  CL 111 110  CO2 26 25  BUN 32* 41*  CREATININE 2.03* 2.58*  GLUCOSE 99 118*   Electrolytes  Recent Labs Lab 12/08/14 1211 12/09/14 0654  CALCIUM 8.8 8.8   Sepsis Markers  Recent Labs Lab 12/09/14 1832  LATICACIDVEN 4.58*   ABG  Recent Labs Lab 12/09/14 1805  PHART 7.236*  PCO2ART 41.8  PO2ART 135.0*   Liver Enzymes  Recent Labs Lab 12/08/14 1211 12/09/14 0654  AST 26 37  ALT 23 25  ALKPHOS 98 90  BILITOT 0.9 1.7*  ALBUMIN 2.9* 2.6*   Cardiac  Enzymes  Recent Labs Lab 12/08/14 1838 12/09/14 0103 12/09/14 0654  TROPONINI 0.12* 0.45* 1.21*   Glucose No results for input(s): GLUCAP in the last 168 hours.  Imaging Dg Chest 2 View  12/08/2014   CLINICAL DATA:  Status post fall this morning with a left hip fracture.  EXAM: CHEST  2 VIEW  COMPARISON:  PA and lateral chest 10/19/2013 and 05/17/2013.  FINDINGS: There is cardiomegaly and mild interstitial edema with small bilateral pleural effusions. No pneumothorax is identified. No focal bony abnormality is seen.  IMPRESSION: Mild interstitial edema with associated small bilateral pleural effusions.   Electronically Signed   By: Inge Rise M.D.   On: 12/08/2014 13:25   Dg Hip Unilat With Pelvis 2-3 Views Left  12/08/2014   CLINICAL DATA:  Status post fall this morning with left hip pain. Initial encounter.  EXAM: LEFT HIP (WITH PELVIS) 2-3 VIEWS  COMPARISON:  None.  FINDINGS: Acute subcapital fracture of the left hip is identified. No other acute bony or joint abnormality is seen. Bones are osteopenic. Atherosclerosis is noted.  IMPRESSION: Acute subcapital fracture left hip.   Electronically Signed   By: Inge Rise M.D.   On: 12/08/2014 13:24    ASSESSMENT /  PLAN:  PULMONARY OETT  2/9 >>> A: Acute respiratory failure in post operative setting PAH - Peak pressure 73 on echo 2014 Pulmonary edema   P:   Full vent support SBT in AM  F/u CXR, ABG - reviewed, keep same MV Need neg balance for him to have successful extubation Wean cpap 5 ps 5, goal 2 hr, unlikely to extubate VAP bundle PRN xopenex  CARDIOVASCULAR A:  Chronic AF (no chronic anticoagulation due to GIB) - rate controlled Elevated troponin - suspect demand ischemia Post op hypotension Acute on Chronic Diastolic CHF Pronlinged QTc H/o HTN  P:  Telemetry monitoring F/u echo Repeat EKG in AM Trend troponin, then stop as flat in setting renal insuff Cardiology following Hold home antihypertensives \\will  need lasix today to get off vent  RENAL A:   Acute on CKD Lactic acidosis Lasix dependent at home P:   KVO IVF Follow Bmet Repeat lactic, improved Lasix start  GASTROINTESTINAL A:   GI prophylaxis  P:   SUP: IV PPI statr TF   HEMATOLOGIC A:   Anemia, chronic   P:  Follow CBC Transfuse per ICU guidelines Heparin for VTE ppx  INFECTIOUS A:   Surgical prophylaxis  P:   UCx 2/9 > Abx: Clinda ,2/9 x 2 doses  Abx: cipro, start date 2/9 >>> Follow WBC and fever curve  ENDOCRINE A:   No acute issues  P:   Follow glucose on chemistry  NEUROLOGIC A:   Acute encephalopathy in post op setting  P:   RASS goal -1 Fentanyl gtt for sedation PRN midazolam Monitor   Georgann Housekeeper, AGACNP-BC [facility] Pager [phone] or [phone]     STAFF NOTE: Linwood Dibbles, MD FACP have personally reviewed patient's available data, including medical history, events of note, physical examination and test results as part of my evaluation. I have discussed with resident/NP and other care providers such as pharmacist, RN and RRT. In addition, I personally evaluated patient and elicited key findings of: ABg reviewed,  crackles now, lasix required, neg balance goals important, MAp goal 60, SBT attempt , unliklet to extubate. No further trop needed, echo awaited, start feeds The patient is critically ill with multiple organ systems failure and requires high  complexity decision making for assessment and support, frequent evaluation and titration of therapies, application of advanced monitoring technologies and extensive interpretation of multiple databases.   Critical Care Time devoted to patient care services described in this note is30  Minutes. This time reflects time of care of this signee: Merrie Roof, MD FACP. This critical care time does not reflect procedure time, or teaching time or supervisory time of PA/NP/Med student/Med Resident etc but could involve care discussion time. Rest per NP/medical resident whose note is outlined above and that I agree with   Lavon Paganini. Titus Mould, MD, [place] Pgr: [facility] 12/10/2014 11:02 AM

## 2014-12-09 NOTE — Progress Notes (Signed)
PROGRESS NOTE  [name] [name] QJJ:[id number] DOB: 1921-10-27 DOA: 12/08/2014 PCP: [name], MD  Assessment/Plan: Closed fracture of left hip  -high risk for surgery   Atrial fibrillation -Currently rate controlled -Continue Lopressor -Not on anticoagulation   Acute on chronic hypoxemic respiratory failure due to:  A) Moderate aortic stenosis with associated severe pulmonary hypertension/RV dilatation  B) Acute on Chronic diastolic heart failure, NYHA class 1 -Current heart exacerbation seems driven by patient noncompliance with diuretics at home -BNP 800 -Last echocardiogram 2014 so we'll repeat this admission -Insert Foley catheter and begin Lasix 60 mg IV every 12 hours -Continue ARB -i/os added -Continue supportive care with oxygen- wears PRN at home - CARDIOLOGY   CKD stage 3, GFR 30-59 ml/min -Baseline renal function as of 2014 was 56/2.11 -Current renal function 32/2.03   CAD/CABG 1996 -Troponin increasing -Currently denying any typical ischemic symptoms -EKG also nonischemic -Cycle cardiac enzymes- increasing   Anemia -Baseline hemoglobin around 9.1 -Current hemoglobin 8.7 and likely reflective of volume overload    HTN  -Current blood pressure inadequately controlled and likely reflective of pain from hip fracture as well as to some degree inadequate diuresis -continue home medications -Diuresis as above   Dyslipidemia -No medications prior to admission   Hypothyroidism -Continue Synthroid -TSH ok   History of prostatic problems and urinary incontinence -u/a show LE and nitrates- culture and cipro for now   Code Status: dnr Family Communication: patient Disposition Plan:    Consultants:  Cards  ortho  Procedures:     HPI/Subjective: No complaints- "I'm here"  Objective: Filed Vitals:   12/09/14 0530  BP: 120/66  Pulse: 82  Temp: 97.6 F (36.4 C)  Resp: 20   No intake or output data in the 24 hours ending 12/09/14  0932 There were no vitals filed for this visit.  Exam:   General:  Pleasant/cooeprative  Cardiovascular: irr  Respiratory: coarse breath sounds- cleared some with cough  Abdomen: +BS, soft   Data Reviewed: Basic Metabolic Panel:  Recent Labs Lab 12/08/14 1211  NA 145  K 3.7  CL 111  CO2 26  GLUCOSE 99  BUN 32*  CREATININE 2.03*  CALCIUM 8.8   Liver Function Tests:  Recent Labs Lab 12/08/14 1211  AST 26  ALT 23  ALKPHOS 98  BILITOT 0.9  PROT 7.0  ALBUMIN 2.9*   No results for input(s): LIPASE, AMYLASE in the last 168 hours. No results for input(s): AMMONIA in the last 168 hours. CBC:  Recent Labs Lab 12/08/14 1211 12/09/14 0654  WBC 8.6 8.2  NEUTROABS 7.6  --   HGB 8.7* 9.7*  HCT 28.1* 31.0*  MCV 89.8 89.1  PLT 157 144*   Cardiac Enzymes:  Recent Labs Lab 12/08/14 1211 12/08/14 1838 12/09/14 0103  TROPONINI 0.06* 0.12* 0.45*   BNP (last 3 results)  Recent Labs  12/08/14 1211  BNP 800.1*    ProBNP (last 3 results) No results for input(s): PROBNP in the last 8760 hours.  CBG: No results for input(s): GLUCAP in the last 168 hours.  No results found for this or any previous visit (from the past 240 hour(s)).   Studies: Dg Chest 2 View  12/08/2014   CLINICAL DATA:  Status post fall this morning with a left hip fracture.  EXAM: CHEST  2 VIEW  COMPARISON:  PA and lateral chest 10/19/2013 and 05/17/2013.  FINDINGS: There is cardiomegaly and mild interstitial edema with small bilateral pleural effusions. No pneumothorax is identified. No  focal bony abnormality is seen.  IMPRESSION: Mild interstitial edema with associated small bilateral pleural effusions.   Electronically Signed   By: Inge Rise M.D.   On: 12/08/2014 13:25   Dg Hip Unilat With Pelvis 2-3 Views Left  12/08/2014   CLINICAL DATA:  Status post fall this morning with left hip pain. Initial encounter.  EXAM: LEFT HIP (WITH PELVIS) 2-3 VIEWS  COMPARISON:  None.  FINDINGS: Acute  subcapital fracture of the left hip is identified. No other acute bony or joint abnormality is seen. Bones are osteopenic. Atherosclerosis is noted.  IMPRESSION: Acute subcapital fracture left hip.   Electronically Signed   By: Inge Rise M.D.   On: 12/08/2014 13:24    Scheduled Meds: . sodium chloride   Intravenous Once  . atorvastatin  10 mg Oral q1800  . docusate sodium  100 mg Oral BID  . febuxostat  40 mg Oral Daily  . furosemide  60 mg Intravenous 3 times per day  . heparin  5,000 Units Subcutaneous 3 times per day  . levothyroxine  25 mcg Oral QAC breakfast  . losartan  50 mg Oral Daily  . metoprolol  50 mg Oral BID   Continuous Infusions:  Antibiotics Given (last 72 hours)    Date/Time Action Medication Dose   12/08/14 2003 Given   ciprofloxacin (CIPRO) tablet 500 mg 500 mg      Principal Problem:   Closed left hip fracture Active Problems:   Atrial fibrillation   Chronic diastolic heart failure, NYHA class 1   CKD (chronic kidney disease) stage 3, GFR 30-59 ml/min   Anemia   HTN (hypertension)   Dyslipidemia   Moderate aortic stenosis   Closed fracture of left hip   Hip fracture requiring operative repair   CAD in native artery, s/p CABG 1996 with patent grafts 2006   Acute on chronic diastolic congestive heart failure   Pulmonary arterial hypertension    Time spent: 25 min    Selby Slovacek  [facility] Pager [phone]. If 7PM-7AM, please contact night-coverage at [email or web address], password [facility] 12/09/2014, 9:32 AM  LOS: 1 day

## 2014-12-09 NOTE — Progress Notes (Signed)
After surgery, will need SDU bed for monitoring on tele [name] Bear DO

## 2014-12-09 NOTE — Anesthesia Postprocedure Evaluation (Signed)
  Anesthesia Post-op Note  Patient: [name] [name]  Procedure(s) Performed: Procedure(s): LEFT HIP HEMIARTHROPLASTY (Left)  Patient Location: SICU  Anesthesia Type:General  Level of Consciousness: sedated and Patient remains intubated per anesthesia plan  Airway and Oxygen Therapy: Patient remains intubated per anesthesia plan and Patient placed on Ventilator (see vital sign flow sheet for setting)  Post-op Pain: none  Post-op Assessment: Post-op Vital signs reviewed and Patient's Cardiovascular Status Stable  Post-op Vital Signs: Reviewed  Last Vitals:  Filed Vitals:   12/09/14 1815  BP: 142/88  Pulse: 107  Temp:   Resp: 20    Complications: Several episodes of hypotension during the case ending time required aggressive measures to maintain a stable condition.  He was able to be moved to the SICU and placed on the ventilator where he remains in a stable condition. He continues to be in a very risky condition due to his known heart disease and age.

## 2014-12-09 NOTE — Progress Notes (Signed)
CRITICAL VALUE ALERT  Critical value received:  Lactic acid 2.2   Date of notification:  12/09/14  Time of notification:  2320  Critical value read back: yes  Nurse who received alert:  Vivia Ewing  MD notified (1st page):  Consistent with previously known value

## 2014-12-09 NOTE — Progress Notes (Signed)
Patient ID: [name] [name], male   DOB: 1921-07-22, 81 y.o.   MRN: [id number] He is awake and alert this am.  His lungs do still sound "junky".  I do have him on the OR schedule for later this afternoon to address his left hip fracture.  He will be high risk regardless.  The family is discussing this and I will await there input as well as any further medical optimization prior to surgery.

## 2014-12-09 NOTE — Progress Notes (Signed)
TELEMETRY: not on telemetry  Filed Vitals:   12/09/14 0036 12/09/14 0100 12/09/14 0340 12/09/14 0530  BP: 142/107 132/72 110/53 120/66  Pulse: 72 92 67 82  Temp: 98 F (36.7 C) 97.8 F (36.6 C) 98.4 F (36.9 C) 97.6 F (36.4 C)  TempSrc: Oral Oral Oral Oral  Resp: 22 20 18 20   SpO2:    91%   No intake or output data in the 24 hours ending 12/09/14 0953 There were no vitals filed for this visit.  Subjective Denies chest pain. States breathing is about like usual.   . sodium chloride   Intravenous Once  . atorvastatin  10 mg Oral q1800  . ciprofloxacin  500 mg Oral Q24H  . docusate sodium  100 mg Oral BID  . febuxostat  40 mg Oral Daily  . furosemide  60 mg Intravenous 3 times per day  . heparin  5,000 Units Subcutaneous 3 times per day  . levothyroxine  25 mcg Oral QAC breakfast  . losartan  50 mg Oral Daily  . metoprolol  50 mg Oral BID      LABS: Basic Metabolic Panel:  Recent Labs  12/08/14 1211 12/09/14 0654  NA 145 145  K 3.7 4.2  CL 111 110  CO2 26 25  GLUCOSE 99 118*  BUN 32* 41*  CREATININE 2.03* 2.58*  CALCIUM 8.8 8.8   Liver Function Tests:  Recent Labs  12/08/14 1211 12/09/14 0654  AST 26 37  ALT 23 25  ALKPHOS 98 90  BILITOT 0.9 1.7*  PROT 7.0 7.0  ALBUMIN 2.9* 2.6*   No results for input(s): LIPASE, AMYLASE in the last 72 hours. CBC:  Recent Labs  12/08/14 1211 12/09/14 0654  WBC 8.6 8.2  NEUTROABS 7.6  --   HGB 8.7* 9.7*  HCT 28.1* 31.0*  MCV 89.8 89.1  PLT 157 144*   Cardiac Enzymes:  Recent Labs  12/08/14 1838 12/09/14 0103 12/09/14 0654  TROPONINI 0.12* 0.45* 1.21*   BNP: No results for input(s): PROBNP in the last 72 hours. D-Dimer: No results for input(s): DDIMER in the last 72 hours. Hemoglobin A1C: No results for input(s): HGBA1C in the last 72 hours. Fasting Lipid Panel: No results for input(s): CHOL, HDL, LDLCALC, TRIG, CHOLHDL, LDLDIRECT in the last 72 hours. Thyroid Function Tests:  Recent  Labs  12/08/14 1501  TSH 2.900     Radiology/Studies:  Dg Chest 2 View  12/08/2014   CLINICAL DATA:  Status post fall this morning with a left hip fracture.  EXAM: CHEST  2 VIEW  COMPARISON:  PA and lateral chest 10/19/2013 and 05/17/2013.  FINDINGS: There is cardiomegaly and mild interstitial edema with small bilateral pleural effusions. No pneumothorax is identified. No focal bony abnormality is seen.  IMPRESSION: Mild interstitial edema with associated small bilateral pleural effusions.   Electronically Signed   By: Inge Rise M.D.   On: 12/08/2014 13:25   Dg Hip Unilat With Pelvis 2-3 Views Left  12/08/2014   CLINICAL DATA:  Status post fall this morning with left hip pain. Initial encounter.  EXAM: LEFT HIP (WITH PELVIS) 2-3 VIEWS  COMPARISON:  None.  FINDINGS: Acute subcapital fracture of the left hip is identified. No other acute bony or joint abnormality is seen. Bones are osteopenic. Atherosclerosis is noted.  IMPRESSION: Acute subcapital fracture left hip.   Electronically Signed   By: Inge Rise M.D.   On: 12/08/2014 13:24    PHYSICAL EXAM General: elderly WM, in  no acute distress. Head: Normocephalic, atraumatic, sclera non-icteric, oropharynx is clear Neck: Negative for carotid bruits. JVD not elevated. No adenopathy Lungs: Coarse bilateral rhonchi.  Heart: IRRR S1 S2 without murmurs, rubs, or gallops.  Abdomen: Soft, non-tender, non-distended with normoactive bowel sounds. No hepatomegaly. No rebound/guarding. No obvious abdominal masses. Msk:  Strength and tone appears normal for age. Extremities: 1-2+ edema with redness and skin breakdown. Distal pedal pulses are 2+ and equal bilaterally. Neuro: Alert and oriented X 3. Moves all extremities spontaneously. Psych:  Responds to questions appropriately with a normal affect.  ASSESSMENT AND PLAN: 1. Closed left hip fracture.  2. Chronic diastolic CHF. BNP elevated. He has lower extremity edema. Unable to assess  diuretic effect. No I/O recorded. No weights. Awaiting Echo. 3. Demand ischemia with elevated troponins. Related to CHF, CKD, respiratory problems and recent stress of fracture. No chest pain. 4. Afib. Rate Ok on meds. Not a candidate for long term anticoagulation due to GIB.  5. CKD- creatinine increased with diuresis. 6. Anemia chronic 7. HTN 8. Moderate aortic stenosis.  9. CAD s/p CABG  Patient is at high risk for surgery from a cardiac standpoint. He understands this but appears willing to take risk rather than be bed bound. Will continue diuresis to try and improve respiratory status. Echo results will not change his risk but may offer some insight into therapy. Would consider telemetry and step down bed post op for close monitoring. We will continue to follow.   Present on Admission:  . Atrial fibrillation . CKD (chronic kidney disease) stage 3, GFR 30-59 ml/min . Anemia . (Resolved) CAD (coronary artery disease) of artery bypass graft . HTN (hypertension) . Dyslipidemia . Closed fracture of left hip . Chronic diastolic heart failure, NYHA class 1 . Hip fracture requiring operative repair . Closed left hip fracture  Signed, Peter Martinique, [place] 12/09/2014 9:53 AM

## 2014-12-09 NOTE — Brief Op Note (Signed)
12/08/2014 - 12/09/2014  5:51 PM  PATIENT:  [name] [name]  81 y.o. male  PRE-OPERATIVE DIAGNOSIS:  Left Femoral Neck Fracture  POST-OPERATIVE DIAGNOSIS:  Left Femoral Neck Fracture  PROCEDURE:  Procedure(s): LEFT HIP HEMIARTHROPLASTY (Left)  SURGEON:  Surgeon(s) and Role:    * Mcarthur Rossetti, MD - Primary  PHYSICIAN ASSISTANT: Benita Stabile, PA-C  ANESTHESIA:   general  EBL:  Total I/O In: 1250 [I.V.:1000; IV Piggyback:250] Out: 300 [Blood:300]  BLOOD ADMINISTERED:none  DRAINS: none   LOCAL MEDICATIONS USED:  NONE  SPECIMEN:  No Specimen  DISPOSITION OF SPECIMEN:  N/A  COUNTS:  YES  TOURNIQUET:  * No tourniquets in log *  DICTATION: .Other Dictation: Dictation Number [id number]  PLAN OF CARE: Admit to inpatient   PATIENT DISPOSITION:  ICU - intubated and critically ill.   Delay start of Pharmacological VTE agent (>24hrs) due to surgical blood loss or risk of bleeding: no

## 2014-12-09 NOTE — Anesthesia Preprocedure Evaluation (Signed)
Anesthesia Evaluation  Patient identified by MRN, date of birth, ID band  Reviewed: Allergy & Precautions, NPO status , Patient's Chart, lab work & pertinent test results  Airway        Dental   Pulmonary COPDformer smoker,          Cardiovascular hypertension, + CAD, + Past MI, + CABG and +CHF + dysrhythmias Atrial Fibrillation     Neuro/Psych    GI/Hepatic   Endo/Other    Renal/GU Renal InsufficiencyRenal disease     Musculoskeletal  (+) Arthritis -,   Abdominal   Peds  Hematology  (+) anemia ,   Anesthesia Other Findings   Reproductive/Obstetrics                             Anesthesia Physical Anesthesia Plan  ASA: IV  Anesthesia Plan: General   Post-op Pain Management:    Induction: Intravenous  Airway Management Planned: Oral ETT  Additional Equipment:   Intra-op Plan:   Post-operative Plan: Extubation in OR  Informed Consent: I have reviewed the patients History and Physical, chart, labs and discussed the procedure including the risks, benefits and alternatives for the proposed anesthesia with the patient or authorized representative who has indicated his/her understanding and acceptance.     Plan Discussed with: CRNA, Anesthesiologist and Surgeon  Anesthesia Plan Comments:         Anesthesia Quick Evaluation

## 2014-12-09 NOTE — Transfer of Care (Signed)
Immediate Anesthesia Transfer of Care Note  Patient: [name] [name]  Procedure(s) Performed: Procedure(s): LEFT HIP HEMIARTHROPLASTY (Left)  Patient Location: ICU  Anesthesia Type:General  Level of Consciousness: Patient remains intubated per anesthesia plan  Airway & Oxygen Therapy: Patient remains intubated per anesthesia plan  Post-op Assessment: Report given to RN and Post -op Vital signs reviewed and stable  Post vital signs: Reviewed and stable  Last Vitals:  Filed Vitals:   12/09/14 1400  BP: 133/83  Pulse: 110  Temp: 36.2 C  Resp: 20    Complications: hypotensive event x2

## 2014-12-10 ENCOUNTER — Inpatient Hospital Stay (HOSPITAL_COMMUNITY): Payer: Medicare Other

## 2014-12-10 ENCOUNTER — Encounter (HOSPITAL_COMMUNITY): Payer: Self-pay | Admitting: Orthopaedic Surgery

## 2014-12-10 DIAGNOSIS — I350 Nonrheumatic aortic (valve) stenosis: Secondary | ICD-10-CM

## 2014-12-10 DIAGNOSIS — I25708 Atherosclerosis of coronary artery bypass graft(s), unspecified, with other forms of angina pectoris: Secondary | ICD-10-CM

## 2014-12-10 DIAGNOSIS — J9600 Acute respiratory failure, unspecified whether with hypoxia or hypercapnia: Secondary | ICD-10-CM | POA: Insufficient documentation

## 2014-12-10 LAB — BASIC METABOLIC PANEL
Anion gap: 8 (ref 5–15)
BUN: 52 mg/dL — ABNORMAL HIGH (ref 6–23)
CO2: 23 mmol/L (ref 19–32)
CREATININE: 3.07 mg/dL — AB (ref 0.50–1.35)
Calcium: 8.2 mg/dL — ABNORMAL LOW (ref 8.4–10.5)
Chloride: 112 mmol/L (ref 96–112)
GFR calc Af Amer: 19 mL/min — ABNORMAL LOW (ref >90)
GFR calc non Af Amer: 16 mL/min — ABNORMAL LOW (ref >90)
GLUCOSE: 121 mg/dL — AB (ref 70–99)
POTASSIUM: 4.2 mmol/L (ref 3.5–5.1)
Sodium: 143 mmol/L (ref 135–145)

## 2014-12-10 LAB — TYPE AND SCREEN
ABO/RH(D): O NEG
ANTIBODY SCREEN: NEGATIVE
Unit division: 0

## 2014-12-10 LAB — POCT I-STAT 3, ART BLOOD GAS (G3+)
Acid-base deficit: 3 mmol/L — ABNORMAL HIGH (ref 0.0–2.0)
Bicarbonate: 22.5 mEq/L (ref 20.0–24.0)
O2 SAT: 99 %
PO2 ART: 121 mmHg — AB (ref 80.0–100.0)
Patient temperature: 98.1
TCO2: 24 mmol/L (ref 0–100)
pCO2 arterial: 39.5 mmHg (ref 35.0–45.0)
pH, Arterial: 7.362 (ref 7.350–7.450)

## 2014-12-10 LAB — URINALYSIS, ROUTINE W REFLEX MICROSCOPIC
GLUCOSE, UA: NEGATIVE mg/dL
KETONES UR: NEGATIVE mg/dL
Nitrite: POSITIVE — AB
PROTEIN: 100 mg/dL — AB
Specific Gravity, Urine: 1.015 (ref 1.005–1.030)
Urobilinogen, UA: 1 mg/dL (ref 0.0–1.0)
pH: 5 (ref 5.0–8.0)

## 2014-12-10 LAB — TROPONIN I
TROPONIN I: 1.41 ng/mL — AB (ref <0.031)
TROPONIN I: 1.44 ng/mL — AB (ref <0.031)
TROPONIN I: 1.42 ng/mL — AB (ref <0.031)
Troponin I: 1.37 ng/mL (ref <0.031)

## 2014-12-10 LAB — CBC
HCT: 27.8 % — ABNORMAL LOW (ref 39.0–52.0)
HEMOGLOBIN: 8.9 g/dL — AB (ref 13.0–17.0)
MCH: 28.3 pg (ref 26.0–34.0)
MCHC: 32 g/dL (ref 30.0–36.0)
MCV: 88.5 fL (ref 78.0–100.0)
PLATELETS: 120 10*3/uL — AB (ref 150–400)
RBC: 3.14 MIL/uL — ABNORMAL LOW (ref 4.22–5.81)
RDW: 17.8 % — ABNORMAL HIGH (ref 11.5–15.5)
WBC: 10.3 10*3/uL (ref 4.0–10.5)

## 2014-12-10 LAB — GLUCOSE, CAPILLARY
Glucose-Capillary: 119 mg/dL — ABNORMAL HIGH (ref 70–99)
Glucose-Capillary: 93 mg/dL (ref 70–99)
Glucose-Capillary: 96 mg/dL (ref 70–99)
Glucose-Capillary: 98 mg/dL (ref 70–99)

## 2014-12-10 LAB — URINE MICROSCOPIC-ADD ON

## 2014-12-10 LAB — LACTIC ACID, PLASMA: Lactic Acid, Venous: 2.1 mmol/L (ref 0.5–2.0)

## 2014-12-10 LAB — MRSA PCR SCREENING: MRSA by PCR: POSITIVE — AB

## 2014-12-10 MED ORDER — PRO-STAT SUGAR FREE PO LIQD
30.0000 mL | Freq: Every day | ORAL | Status: DC
  Administered 2014-12-10 – 2014-12-11 (×2): 30 mL
  Filled 2014-12-10 (×4): qty 30

## 2014-12-10 MED ORDER — VITAL HIGH PROTEIN PO LIQD
1000.0000 mL | ORAL | Status: DC
  Administered 2014-12-10 – 2014-12-11 (×2): 1000 mL
  Filled 2014-12-10 (×4): qty 1000

## 2014-12-10 MED ORDER — FUROSEMIDE 10 MG/ML IJ SOLN
80.0000 mg | Freq: Two times a day (BID) | INTRAMUSCULAR | Status: AC
  Administered 2014-12-10 – 2014-12-11 (×4): 80 mg via INTRAVENOUS
  Filled 2014-12-10 (×4): qty 8

## 2014-12-10 MED ORDER — VITAL HIGH PROTEIN PO LIQD
1000.0000 mL | ORAL | Status: DC
  Filled 2014-12-10 (×2): qty 1000

## 2014-12-10 MED ORDER — CHLORHEXIDINE GLUCONATE CLOTH 2 % EX PADS
6.0000 | MEDICATED_PAD | Freq: Every day | CUTANEOUS | Status: AC
Start: 2014-12-10 — End: 2014-12-14
  Administered 2014-12-10 – 2014-12-14 (×5): 6 via TOPICAL

## 2014-12-10 MED ORDER — MUPIROCIN 2 % EX OINT
1.0000 "application " | TOPICAL_OINTMENT | Freq: Two times a day (BID) | CUTANEOUS | Status: AC
  Administered 2014-12-10 – 2014-12-15 (×10): 1 via NASAL
  Filled 2014-12-10 (×2): qty 22

## 2014-12-10 NOTE — Clinical Social Work Note (Addendum)
CSW received consult for patient, attempted to complete assessment with patient, however he is currently intubated and on a ventilator and was not able to respond.  Will contact patient's family to complete assessment.  Contacted patient's wife and left a message for her to call back.  Jones Broom. [place], MSW, [place] 12/10/2014 3:08 PM

## 2014-12-10 NOTE — Care Management Note (Signed)
    Page 1 of 2   12/22/2014     2:07:10 PM CARE MANAGEMENT NOTE 12/22/2014  Patient:  [name] [name], [name] [name]   Account Number:  [id number]  Date Initiated:  12/10/2014  Documentation initiated by:  [facility]  Subjective/Objective Assessment:   Admitted after fall with hip fracture.  Post op tx to ICU on vent     Action/Plan:   Anticipated DC Date:  12/22/2014   Anticipated DC Plan:  SKILLED NURSING FACILITY  In-house referral  Clinical Social Worker      DC Planning Services  CM consult      Choice offered to / List presented to:             Status of service:  Completed, signed off Medicare Important Message given?  YES (If response is "NO", the following Medicare IM given date fields will be blank) Date Medicare IM given:  12/17/2014 Medicare IM given by:  MAYO,HENRIETTA Date Additional Medicare IM given:  12/22/2014 Additional Medicare IM given by:  Almyra Free Nadalie Laughner  Discharge Disposition:  [place]  Per UR Regulation:  Reviewed for med. necessity/level of care/duration of stay  If discussed at [place] of Stay Meetings, dates discussed:    Comments:  Contact: Lambert,Carolyn Daughter [phone]  [phone]    Hass,Judy Daughter [phone]  12/22/14 Ellan Lambert, RN, BSN [phone] Pt for dc to SNF today, per CSW arrangements.  12/17/14 [place] MSN BSN CCM  Cardizem gtt changed to PO, diuresing with IV Lasix.  CSW following for SNF placement.   12-11-14 11am Luz Lex, RNBSN- [phone] Talked with daughters Bethena Roys and Arbie Cookey.  Confirmed patient lives at home alone and does have hospice services with [facility].  Would like him back home with [facility] as before but realize that he may need rehab post hip fx repair. Remains on vent - renal function worse.  CM will continue to follow.  12-10-14 10:24am Luz Lex, RNBSN  [phone] patient lives at home prior to admission - has home care with [facility] with aide, nurse, sw and chaplain  services.  Contact Audrey at [phone] prior to discharge.  On vent - Suspect will need rehab prior to going home.  SW consult placed.

## 2014-12-10 NOTE — Progress Notes (Signed)
CRITICAL VALUE ALERT  Critical value received:  Positive MRSA swab  Date of notification:  12/10/14  Time of notification:  0520  Critical value read back: yes  Nurse who received alert:  A. Darcel Smalling RN  MD notified (1st page):  Started protocol

## 2014-12-10 NOTE — Progress Notes (Signed)
Patient ID: [name] [name], male   DOB: 30-Aug-1921, 81 y.o.   MRN: [id number] Stable on the vent.  Left hip dressing clean and intact.  No restrictions as far as his left hip goes.  Will follow.

## 2014-12-10 NOTE — Progress Notes (Signed)
PT Cancellation Note  Patient Details Name: [name] [name] MRN: [id number] DOB: 01/22/1921   Cancelled Treatment:    Reason Eval/Treat Not Completed: Patient not medically ready, currently sedated on vent. Will check back tomorrow.   [place], [place] 12/10/2014, 11:08 AM

## 2014-12-10 NOTE — Progress Notes (Signed)
OT Cancellation Note  Patient Details Name: [name] [name] MRN: [id number] DOB: 1921/03/11   Cancelled Treatment:    Reason Eval/Treat Not Completed: Medical issues which prohibited therapy. Pt remains intubated and sedated.  Will continue to follow.  Malka So 12/10/2014, 8:57 AM

## 2014-12-10 NOTE — Progress Notes (Signed)
Upon initial skin assessment of Mr. [name] [name], venous ulcerations found on bilateral lower legs. Multiple open areas noted. Several dried and healing places noted. The following are the measurements of open sores on legs:   Posterior left leg: (from the top down LxWxD) 1cm x 1cm x 0cm 3cm x 1cm x 0cm 3cmx 1cm x 0cm   Outer Lateral left leg: (LxWxD) 0.5cm x 0.25cm x 0cm  Anterior left leg: (from the top down LxWxD) 0.5cm x 0.5cm x 0cm 2cm x 0.5cm x 0cm  1.5cm x 1cm x 0cm 2cm x 1cm x 0cm  Inner lateral left leg: (LxWxD)  2cm x 1cm x 0cm  Right anterior leg: (from the top down LxWxD) 4.5cm x 2cm x 0cm 1cm x 0.25cm x 0cm   All areas cleaned and mepilex applied. Small amount of purulent drainage noted.   [name] Blade RN

## 2014-12-10 NOTE — Progress Notes (Signed)
INITIAL NUTRITION ASSESSMENT  DOCUMENTATION CODES Per approved criteria  -Obesity Unspecified   INTERVENTION: Initiate Vital High Protrein @ 20 ml/hr via OGT and increase by 10 ml every 4 hours to goal rate of 50 ml/hr.   30 ml Prostat once daily.    Tube feeding regimen provides 1300 kcal (72% of needs), 120 grams of protein, and 1008 ml of H2O.    NUTRITION DIAGNOSIS: Inadequate oral intake related to inability to eat as evidenced by NPO/Vent status.   Goal: Enteral nutrition to provide 60-70% of estimated calorie needs (22-25 kcals/kg ideal body weight) and 100% of estimated protein needs, based on ASPEN guidelines for hypocaloric, high protein feeding in critically ill obese individuals  Monitor:  Vent status, TF initiation/tolerance, weight trend, labs  Reason for Assessment: Vent/ Consult for enteral/tube feedings initiation management  81 y.o. male  Admitting Dx: Closed left hip fracture  ASSESSMENT: 81 year old patient with a history of severe pulmonary hypertension, moderate aortic stenosis, chronic diastolic heart failure with a close left hip fracture following a mechanical fall. Taken to OR 2/9 for left hip hemiarthroplasty and following OR, returned to ICU on vent.   Patient is currently intubated on ventilator support MV: 15 L/min Temp (24hrs), Avg:97.8 F (36.6 C), Min:97.1 F (36.2 C), Max:98.1 F (36.7 C)  Propofol: none  Labs: low caicum, low GFR, elevated BUN/creatinine, low hemoglobin   Height: Ht Readings from Last 1 Encounters:  12/09/14 5\' 5"  (1.651 m)    Weight: Wt Readings from Last 1 Encounters:  12/09/14 199 lb 6.4 oz (90.447 kg)    Ideal Body Weight: 136 lbs (61.8 kg)  % Ideal Body Weight: 146%  Wt Readings from Last 10 Encounters:  12/09/14 199 lb 6.4 oz (90.447 kg)  10/23/13 168 lb 14 oz (76.6 kg)  07/25/13 162 lb (73.483 kg)  05/30/13 185 lb (83.915 kg)  05/22/13 185 lb (83.915 kg)  05/16/13 171 lb 1.2 oz (77.6 kg)   04/19/13 171 lb (77.565 kg)    Usual Body Weight: unknown  % Usual Body Weight: NA  BMI:  Body mass index is 33.18 kg/(m^2).  Estimated Nutritional Needs: Kcal: 3557 (underfeeding goal: 3220-2542) Protein: >/=124 grams Fluid: 2.5 L/day  Skin: +2 generalized edema; +3 RLE and LLE edema  Diet Order: Diet NPO time specified  EDUCATION NEEDS: -No education needs identified at this time   Intake/Output Summary (Last 24 hours) at 12/10/14 1206 Last data filed at 12/10/14 0900  Gross per 24 hour  Intake 1555.73 ml  Output    985 ml  Net 570.73 ml    Last BM: 2/8   Labs:   Recent Labs Lab 12/08/14 1211 12/09/14 0654 12/10/14 0422  NA 145 145 143  K 3.7 4.2 4.2  CL 111 110 112  CO2 26 25 23   BUN 32* 41* 52*  CREATININE 2.03* 2.58* 3.07*  CALCIUM 8.8 8.8 8.2*  GLUCOSE 99 118* 121*    CBG (last 3)  No results for input(s): GLUCAP in the last 72 hours.  Scheduled Meds: . sodium chloride   Intravenous Once  . antiseptic oral rinse  7 mL Mouth Rinse QID  . atorvastatin  10 mg Oral q1800  . chlorhexidine  15 mL Mouth Rinse BID  . Chlorhexidine Gluconate Cloth  6 each Topical Q0600  . ciprofloxacin  200 mg Intravenous Q24H  . feeding supplement (VITAL HIGH PROTEIN)  1,000 mL Per Tube Q24H  . fentaNYL  50 mcg Intravenous Once  . furosemide  80  mg Intravenous Q12H  . heparin  5,000 Units Subcutaneous 3 times per day  . levothyroxine  75 mcg Oral QAC breakfast  . levothyroxine  75 mcg Oral Once  . mupirocin ointment  1 application Nasal BID  . pantoprazole (PROTONIX) IV  40 mg Intravenous Daily    Continuous Infusions: . fentaNYL infusion INTRAVENOUS 100 mcg/hr (12/10/14 0900)    Past Medical History  Diagnosis Date  . Hypertension   . History of prostate cancer 1996    treated with radiation  . Arthritis   . Gout, arthritis 2010    bil feet  . Cancer     PORSTATE-HX OF RADIATION AND SURGERY  . Diverticulosis of colon   . CAD (coronary artery  disease)     cabg 1996; echo 03/15/2007-EF 45-50%, LA mod to severe dilated, mod TR, mod pulmonary HTN, mod sclerotic aortic valve; myoview6/23/2010- no ischemia  . A-fib     chronic, no longer on coumadin due to rectal bleeding  . Hyperlipidemia   . Rectal bleed   . CHF (congestive heart failure) 10/18/13    Past Surgical History  Procedure Laterality Date  . Coronary artery bypass graft  04/1995    LIMA to LAD, vein graft to diag branch, vein to 3 sequential marginal branches, and vein to PDA  . Appendectomy  1950s  . Eye surgery  2009    cataract surgery  . Cryoablation of prostate  06/23/2005  . Colonoscopy  11/03/2000  . Cardiac catheterization  05/04/2005    patent grafts  . Cardiac catheterization  04/10/1995    presyncopal episode on way to OR, 3V disease with left main involvement, nl LV, CVTS was contacted    Pryor Ochoa RD, LDN Inpatient Clinical Dietitian Pager: [phone] After Hours Pager: [phone]

## 2014-12-10 NOTE — Progress Notes (Signed)
  Echocardiogram 2D Echocardiogram has been performed.  Dola Lunsford FRANCES 12/10/2014, 11:17 AM

## 2014-12-10 NOTE — Progress Notes (Signed)
TELEMETRY: Reviewed telemetry pt in Atrial fibrillation with controlled rate 70s: Filed Vitals:   12/10/14 0300 12/10/14 0400 12/10/14 0500 12/10/14 0600  BP: 96/49 90/47 83/50  91/53  Pulse: 65 55 63 69  Temp:  98.1 F (36.7 C)    TempSrc:  Oral    Resp: 16 16 16 16   Height:      Weight:      SpO2: 100% 100% 100% 100%    Intake/Output Summary (Last 24 hours) at 12/10/14 0718 Last data filed at 12/10/14 0600  Gross per 24 hour  Intake 1540.73 ml  Output    900 ml  Net 640.73 ml   Filed Weights   12/09/14 1049  Weight: 199 lb 6.4 oz (90.447 kg)    Subjective Awake on vent. Patient had left hemiarthroplasty last pm complicated by hypotension with BP down to 50 requiring pressors. Pressors DC'd before arrival in unit. BP stable overnight. Metoprolol held.  . sodium chloride   Intravenous Once  . antiseptic oral rinse  7 mL Mouth Rinse QID  . atorvastatin  10 mg Oral q1800  . chlorhexidine  15 mL Mouth Rinse BID  . Chlorhexidine Gluconate Cloth  6 each Topical Q0600  . ciprofloxacin  200 mg Intravenous Q24H  . fentaNYL  50 mcg Intravenous Once  . heparin  5,000 Units Subcutaneous 3 times per day  . levothyroxine  75 mcg Oral QAC breakfast  . levothyroxine  75 mcg Oral Once  . mupirocin ointment  1 application Nasal BID  . pantoprazole (PROTONIX) IV  40 mg Intravenous Daily   . fentaNYL infusion INTRAVENOUS 50 mcg/hr (12/10/14 0600)    LABS: Basic Metabolic Panel:  Recent Labs  12/09/14 0654 12/10/14 0422  NA 145 143  K 4.2 4.2  CL 110 112  CO2 25 23  GLUCOSE 118* 121*  BUN 41* 52*  CREATININE 2.58* 3.07*  CALCIUM 8.8 8.2*   Liver Function Tests:  Recent Labs  12/08/14 1211 12/09/14 0654  AST 26 37  ALT 23 25  ALKPHOS 98 90  BILITOT 0.9 1.7*  PROT 7.0 7.0  ALBUMIN 2.9* 2.6*   No results for input(s): LIPASE, AMYLASE in the last 72 hours. CBC:  Recent Labs  12/08/14 1211 12/09/14 0654 12/10/14 0422  WBC 8.6 8.2 10.3  NEUTROABS 7.6  --    --   HGB 8.7* 9.7* 8.9*  HCT 28.1* 31.0* 27.8*  MCV 89.8 89.1 88.5  PLT 157 144* 120*   Cardiac Enzymes:  Recent Labs  12/09/14 2146 12/10/14 0144 12/10/14 0422  TROPONINI 1.41* 1.44* 1.42*   BNP: No results for input(s): PROBNP in the last 72 hours. D-Dimer: No results for input(s): DDIMER in the last 72 hours. Hemoglobin A1C: No results for input(s): HGBA1C in the last 72 hours. Fasting Lipid Panel: No results for input(s): CHOL, HDL, LDLCALC, TRIG, CHOLHDL, LDLDIRECT in the last 72 hours. Thyroid Function Tests:  Recent Labs  12/08/14 1501  TSH 2.900     Radiology/Studies:  Dg Chest 2 View  12/08/2014   CLINICAL DATA:  Status post fall this morning with a left hip fracture.  EXAM: CHEST  2 VIEW  COMPARISON:  PA and lateral chest 10/19/2013 and 05/17/2013.  FINDINGS: There is cardiomegaly and mild interstitial edema with small bilateral pleural effusions. No pneumothorax is identified. No focal bony abnormality is seen.  IMPRESSION: Mild interstitial edema with associated small bilateral pleural effusions.   Electronically Signed   By: Inge Rise M.D.  On: 12/08/2014 13:25   Pelvis Portable  12/09/2014   CLINICAL DATA:  Postop left anterior hip.  EXAM: PORTABLE PELVIS 1-2 VIEWS  COMPARISON:  12/08/2014  FINDINGS: Interval placement of a left hemiarthroplasty using uncemented femoral component. Components appear well seated. No evidence of dislocation of the hip joint. Mild degenerative changes in the right hip. Vascular calcifications.  IMPRESSION: Left hip hemiarthroplasty with components appearing well-seated.   Electronically Signed   By: Lucienne Capers M.D.   On: 12/09/2014 21:30   Portable Chest Xray  12/09/2014   CLINICAL DATA:  Acute respiratory failure.  EXAM: PORTABLE CHEST - 1 VIEW  COMPARISON:  12/09/2014  FINDINGS: Endotracheal tube tip just below the clavicular heads. Gastric suction tube reaches the stomach.  Progressive opacification of the mid and lower  chest with hazy appearance consistent with pleural fluid and atelectasis. The underlying lungs are atelectatic or consolidated. Cardiomegaly with pulmonary venous congestion. No air leak.  IMPRESSION: 1. New endotracheal and orogastric tubes are in good position. 2. Enlarged or layering pleural effusions with atelectasis or consolidation. 3. Pulmonary venous congestion.   Electronically Signed   By: Monte Fantasia M.D.   On: 12/09/2014 20:08   Dg Hip Unilat With Pelvis 2-3 Views Left  12/08/2014   CLINICAL DATA:  Status post fall this morning with left hip pain. Initial encounter.  EXAM: LEFT HIP (WITH PELVIS) 2-3 VIEWS  COMPARISON:  None.  FINDINGS: Acute subcapital fracture of the left hip is identified. No other acute bony or joint abnormality is seen. Bones are osteopenic. Atherosclerosis is noted.  IMPRESSION: Acute subcapital fracture left hip.   Electronically Signed   By: Inge Rise M.D.   On: 12/08/2014 13:24   Ecg: AFib with controlled rate. QTc 492 msec. No acute change.   PHYSICAL EXAM General: Elderly, chronically ill male, on vent but arousable. Head: Normocephalic, atraumatic, sclera non-icteric, oropharynx is clear Neck: Negative for carotid bruits. JVD is elevated. No adenopathy Lungs: Clear anteriorly on left. Some faint rhonchi on right. Heart: IRRR S1 S2 with grade 2/6 systolic murmur of AS. Abdomen: Soft, non-tender, non-distended with normoactive bowel sounds. No hepatomegaly. No rebound/guarding. No obvious abdominal masses. Extremities: 1+edema.  Distal pedal pulses are 2+ and equal bilaterally. Neuro: Sedated on vent but awake. Moves all extremities spontaneously.   ASSESSMENT AND PLAN: 1. Closed left hip fracture. s/p left hemiarthroplasty.  2. Chronic diastolic CHF. BNP elevated. He has lower extremity edema. Unable to assess diuretic effect.  Awaiting Echo (not done pre op). Awaiting CXR this am. May need further diuresis.  3. Demand ischemia with elevated  troponins. Flat troponin curve. Related to CHF, CKD, respiratory problems and recent stress of fracture. No chest pain. Ecg without acute ST changes this am. 4. Afib. Rate controlled currently. Metoprolol held due to hypotension. Will monitor. Not a candidate for long term anticoagulation due to GIB.  5. Acute on CKD. Creatinine increased 2.3>2.58>3.07. Suspect component of ATN with hypotension intraop. 6. Anemia chronic 7. HTN 8. Moderate aortic stenosis.  9. CAD s/p CABG 10. Acute respiratory failure. Vent management per CCM. On broad spectrum antibiotics.   Present on Admission:  . Atrial fibrillation . CKD (chronic kidney disease) stage 3, GFR 30-59 ml/min . Anemia . (Resolved) CAD (coronary artery disease) of artery bypass graft . HTN (hypertension) . Dyslipidemia . Closed fracture of left hip . Chronic diastolic heart failure, NYHA class 1 . Hip fracture requiring operative repair . Closed left hip fracture  Signed, Kiyra Slaubaugh [place],  [place] 12/10/2014 7:18 AM

## 2014-12-10 NOTE — Progress Notes (Signed)
Dr. Jimmy Footman notified of SBP 80s-90s. No orders received at this time. Will continue to closely monitor. Richarda Blade RN

## 2014-12-10 NOTE — Op Note (Signed)
NAMEFOY, VANDUYNE                 ACCOUNT NO.:  [id number]  MEDICAL RECORD NO.:  [id number]  LOCATION:  2S12C                        FACILITY:  [facility]  PHYSICIAN:  Lind Guest. [name], M.D.DATE OF BIRTH:  Sep 14, 1921  DATE OF PROCEDURE:  12/09/2014 DATE OF DISCHARGE:                              OPERATIVE REPORT   PREOPERATIVE DIAGNOSIS:  Displaced left hip femoral neck fracture.  POSTOPERATIVE DIAGNOSIS:  Displaced left hip femoral neck fracture.  PROCEDURE:  Left hip hemiarthroplasty.  IMPLANTS:  DePuy Summit Basic Press-Fit stem size 7, size 52 unipolar head with +0 spacer.  SURGEON:  Lind Guest. [name], M.D.  ASSISTANT:  Erskine Emery, PA-C.  ANESTHESIA:  General.  BLOOD LOSS:  Less than 200 mL.  COMPLICATIONS:  Became extremely hypotensive during surgery with nearly coating.  DISPOSITION:  To the ICU, intubated in serious condition.  INDICATIONS:  Mr. Novosel is a 81 year old gentleman in hospice care who lives alone at home, but has very close family support.  He had unfortunately become noncompliant with his Lasix and developed significant peripheral edema.  He had a mechanical fall yesterday morning and sustained a left hip displaced femoral neck fracture.  He was admitted to the [facility] and Cardiology saw him, they worked on improving his pulmonary edema with Lasix.  He has significant peripheral vascular disease as well.  We had a lengthy discussion with him as well as his family.  He is of sound mind and one recommendation was nonoperative treatment.  He is someone who tries to get up daily, he was so uncomfortable with even trying to sit up in bed or roll over in bed that the pain was debilitating and he did wish for an operative intervention knowing full well that he is at high risk with surgery and is at high risk without surgery.  The family was told, they understands the mortality rate, someone like this without  operative treatment is at near 100% less than a year, but is very high mortality rate based on his pulmonary edema and his congestive heart failure.  The family thought about it overnight and talked with him in detail and today did decide to proceed with surgery understanding that have the outcome maybe in either away.  Even talking with him properly, he did wish to proceed with surgery with hoping for the best outcome with understanding how grave the situation is.  PROCEDURE DESCRIPTION:  After informed consent was obtained, appropriate left hip was marked.  He was brought to the operating room and placed supine on the operating table.  General anesthesia was then obtained. Anesthesia then placed an arterial line in his left arm and was turned in lateral decubitus position with left operative hip up and all of this went stable.  We then prepped his left hip with DuraPrep and sterile drapes.  Hip positioner was placed in the front and the back prior to draping as well.  A time-out was called and he was identified as correct patient and correct left hip.  We then made an incision over the greater trochanter and carried this proximally and distally.  I found edematous tissue and third spacing, but no frank  bleeding.  We then went down to the iliotibial band and divided the iliotibial band longitudinally and proceeded with an anterolateral approach to the hip.  I took down a sleeve of gluteus medius and minimus tendon off the greater trochanter and reflected this anteriorly.  I placed a Charnley retractor as well and I dissected down the hip capsule.  I opened up the hip capsule, found a large joint effusion and hematoma.  I then made my femoral neck cut with an oscillating saw just proximal to the lesser trochanter and removed remnants of the femoral neck.  I then removed the femoral head in its entirety and found it to be completely broken, but no significant arthritic disease.  We tried  a size 52 head and that was stable.  So, went to the broaching aspect of the case using initiating reamer on the femur and a canal finder and then began broaching from a size 1 up to a size 7 broach.  With a 7 broach in place, it was when he started to become more hypotensive.  So, we then knocked the 7 broach out and placed the real Summit Basic Press-Fit stem size 7 and a real 52 unipolar head with a +0 spacer.  We reduced this into the pelvis and was stable.  We irrigated the tissue quickly and closed the joint capsule with #1 Ethibond suture followed by the reapproximating the gluteus medius and minimus tendon back to the greater trochanter.  I closed his iliotibial band with interrupted #1 Ethibond suture followed by 2-0 Vicryl in the subcutaneous tissue and interrupted staples on the skin. All during closure, we talked to Anesthesia about whether or not we needed to turn him back in the supine position, but they had expressed forward, being able to maintain his pressure.  We then placed a well- padded sterile dressing, got him back in the supine position.  They were able to stabilize enough to take him intubated, but quite serious condition to the ICU.  I talked to the family in length about what went on intraoperative and postoperative.  Of note, Erskine Emery, PA-C assisted during the entire case and his assistance was crucial for facilitating all aspects of this case.     Lind Guest. [name], M.D.     CYB/MEDQ  D:  12/09/2014  T:  12/10/2014  Job:  [id number]

## 2014-12-11 ENCOUNTER — Inpatient Hospital Stay (HOSPITAL_COMMUNITY): Payer: Medicare Other

## 2014-12-11 DIAGNOSIS — N189 Chronic kidney disease, unspecified: Secondary | ICD-10-CM

## 2014-12-11 DIAGNOSIS — J811 Chronic pulmonary edema: Secondary | ICD-10-CM | POA: Insufficient documentation

## 2014-12-11 DIAGNOSIS — J810 Acute pulmonary edema: Secondary | ICD-10-CM

## 2014-12-11 DIAGNOSIS — N179 Acute kidney failure, unspecified: Secondary | ICD-10-CM | POA: Insufficient documentation

## 2014-12-11 DIAGNOSIS — J9601 Acute respiratory failure with hypoxia: Secondary | ICD-10-CM

## 2014-12-11 LAB — GLUCOSE, CAPILLARY
GLUCOSE-CAPILLARY: 136 mg/dL — AB (ref 70–99)
GLUCOSE-CAPILLARY: 136 mg/dL — AB (ref 70–99)
Glucose-Capillary: 110 mg/dL — ABNORMAL HIGH (ref 70–99)
Glucose-Capillary: 114 mg/dL — ABNORMAL HIGH (ref 70–99)
Glucose-Capillary: 123 mg/dL — ABNORMAL HIGH (ref 70–99)

## 2014-12-11 LAB — CBC WITH DIFFERENTIAL/PLATELET
Basophils Absolute: 0 10*3/uL (ref 0.0–0.1)
Basophils Relative: 0 % (ref 0–1)
Eosinophils Absolute: 0 10*3/uL (ref 0.0–0.7)
Eosinophils Relative: 0 % (ref 0–5)
HCT: 26.1 % — ABNORMAL LOW (ref 39.0–52.0)
Hemoglobin: 8.4 g/dL — ABNORMAL LOW (ref 13.0–17.0)
LYMPHS PCT: 9 % — AB (ref 12–46)
Lymphs Abs: 0.9 10*3/uL (ref 0.7–4.0)
MCH: 28.1 pg (ref 26.0–34.0)
MCHC: 32.2 g/dL (ref 30.0–36.0)
MCV: 87.3 fL (ref 78.0–100.0)
Monocytes Absolute: 0.5 10*3/uL (ref 0.1–1.0)
Monocytes Relative: 5 % (ref 3–12)
NEUTROS PCT: 86 % — AB (ref 43–77)
Neutro Abs: 8.2 10*3/uL — ABNORMAL HIGH (ref 1.7–7.7)
PLATELETS: 127 10*3/uL — AB (ref 150–400)
RBC: 2.99 MIL/uL — AB (ref 4.22–5.81)
RDW: 17.8 % — ABNORMAL HIGH (ref 11.5–15.5)
WBC: 9.7 10*3/uL (ref 4.0–10.5)

## 2014-12-11 LAB — URINE CULTURE

## 2014-12-11 LAB — COMPREHENSIVE METABOLIC PANEL
ALT: 79 U/L — ABNORMAL HIGH (ref 0–53)
AST: 95 U/L — AB (ref 0–37)
Albumin: 2.1 g/dL — ABNORMAL LOW (ref 3.5–5.2)
Alkaline Phosphatase: 72 U/L (ref 39–117)
Anion gap: 7 (ref 5–15)
BUN: 68 mg/dL — ABNORMAL HIGH (ref 6–23)
CALCIUM: 7.8 mg/dL — AB (ref 8.4–10.5)
CO2: 25 mmol/L (ref 19–32)
CREATININE: 3.96 mg/dL — AB (ref 0.50–1.35)
Chloride: 111 mmol/L (ref 96–112)
GFR, EST AFRICAN AMERICAN: 14 mL/min — AB (ref >90)
GFR, EST NON AFRICAN AMERICAN: 12 mL/min — AB (ref >90)
Glucose, Bld: 123 mg/dL — ABNORMAL HIGH (ref 70–99)
Potassium: 3.7 mmol/L (ref 3.5–5.1)
SODIUM: 143 mmol/L (ref 135–145)
Total Bilirubin: 0.8 mg/dL (ref 0.3–1.2)
Total Protein: 5.5 g/dL — ABNORMAL LOW (ref 6.0–8.3)

## 2014-12-11 MED ORDER — PHENYLEPHRINE HCL 10 MG/ML IJ SOLN
30.0000 ug/min | INTRAVENOUS | Status: DC
  Administered 2014-12-11: 10 ug/min via INTRAVENOUS
  Filled 2014-12-11: qty 4

## 2014-12-11 MED ORDER — SODIUM CHLORIDE 0.9 % IJ SOLN
10.0000 mL | INTRAMUSCULAR | Status: DC | PRN
  Administered 2014-12-12 – 2014-12-21 (×2): 10 mL
  Administered 2014-12-22: 20 mL
  Filled 2014-12-11 (×3): qty 40

## 2014-12-11 MED ORDER — DEXMEDETOMIDINE HCL IN NACL 200 MCG/50ML IV SOLN
0.2000 ug/kg/h | INTRAVENOUS | Status: DC
  Administered 2014-12-11: 0.4 ug/kg/h via INTRAVENOUS
  Administered 2014-12-12: 0.3 ug/kg/h via INTRAVENOUS
  Filled 2014-12-11 (×3): qty 50

## 2014-12-11 MED ORDER — SODIUM CHLORIDE 0.9 % IJ SOLN
10.0000 mL | Freq: Two times a day (BID) | INTRAMUSCULAR | Status: DC
  Administered 2014-12-11 – 2014-12-14 (×7): 10 mL

## 2014-12-11 MED ORDER — FAMOTIDINE 40 MG/5ML PO SUSR
20.0000 mg | Freq: Every day | ORAL | Status: DC
  Administered 2014-12-11 – 2014-12-12 (×2): 20 mg
  Filled 2014-12-11 (×2): qty 2.5

## 2014-12-11 NOTE — Progress Notes (Signed)
Patient ID: [name] [name], male   DOB: 1921-06-02, 81 y.o.   MRN: [id number] Family at the bedside.  Patient still requiring mechanical ventilation.  Left hip stable.  Can be up with therapy and no hip precautions if he improves medically.

## 2014-12-11 NOTE — Progress Notes (Signed)
Peripherally Inserted Central Catheter/Midline Placement  The IV Nurse has discussed with the patient and/or persons authorized to consent for the patient, the purpose of this procedure and the potential benefits and risks involved with this procedure.  The benefits include less needle sticks, lab draws from the catheter and patient may be discharged home with the catheter.  Risks include, but not limited to, infection, bleeding, blood clot (thrombus formation), and puncture of an artery; nerve damage and irregular heat beat.  Alternatives to this procedure were also discussed.  PICC/Midline Placement Documentation        [name] [name] 12/11/2014, 12:49 PM Consent obtained by Jacobo Forest, RN, CRNI from daughter at bedside.

## 2014-12-11 NOTE — Progress Notes (Signed)
PULMONARY / CRITICAL CARE MEDICINE   Name: [name] [name] MRN: [id number] DOB: 1921-05-14    ADMISSION DATE:  12/08/2014 CONSULTATION DATE:  12/11/2014  Service provided 12/10/14  REFERRING MD :  [name]  CHIEF COMPLAINT:  Left femoral fx s/p left hemiarthroplasty 2/9, back to ICU on vent  INITIAL PRESENTATION:  81 y.o. M who sustained left hip fx on 2/8 after mechanical fall at home.  Taken to OR 2/9 for left hip hemiarthroplasty and following OR, returned to ICU on vent.  PCCM consulted for vent management.  STUDIES:  L Hip X Ray 2/8 >>> acute subcapital fx left hip. CXR 2/8 >>> mild interstitial edema with associated small b/l pleural effusions  SIGNIFICANT EVENTS: 2/8 - admitted with L hip fx 2/9 - left hip hemiarthroplasty [name]).  Returned to OR on vent, PCCM consulted.    SUBJECTIVE: r RASS 0 to -1. + F/C. Failed SBT  VITAL SIGNS: Temp:  [97.9 F (36.6 C)-100.3 F (37.9 C)] 98.4 F (36.9 C) (02/11 1500) Pulse Rate:  [66-107] 84 (02/11 1600) Resp:  [9-30] 16 (02/11 1600) BP: (80-157)/(35-99) 82/50 mmHg (02/11 1600) SpO2:  [91 %-100 %] 93 % (02/11 1600) Arterial Line BP: (83-156)/(36-82) 92/44 mmHg (02/11 1600) FiO2 (%):  [30 %-50 %] 30 % (02/11 1530) Weight:  [91.9 kg (202 lb 9.6 oz)] 91.9 kg (202 lb 9.6 oz) (02/11 0100) HEMODYNAMICS:   VENTILATOR SETTINGS: Vent Mode:  [-] PRVC FiO2 (%):  [30 %-50 %] 30 % Set Rate:  [16 bmp] 16 bmp Vt Set:  [550 mL] 550 mL PEEP:  [5 cmH20] 5 cmH20 Pressure Support:  [10 cmH20-14 cmH20] 14 cmH20 Plateau Pressure:  [18 cmH20-23 cmH20] 23 cmH20 INTAKE / OUTPUT: Intake/Output      02/10 0701 - 02/11 0700 02/11 0701 - 02/12 0700   I.V. (mL/kg) 162.5 (1.8) 36 (0.4)   NG/GT 683 540   IV Piggyback     Total Intake(mL/kg) 845.5 (9.2) 576 (6.3)   Urine (mL/kg/hr) 415 (0.2) 395 (0.5)   Blood     Total Output 415 395   Net +430.5 +181          PHYSICAL EXAMINATION: General: RASS 0, + F/C Neuro: o focal deficits HEENT: [place]/AT.  PERRL, sclerae anicteric. Cardiovascular: RRR, no M/R/G.  Lungs: Coarse bilaterally. Abdomen: BS x 4, soft, NT/ND.  Musculoskeletal: Chronic stasis changes, 1+ LE pitting edema.  Skin: Intact, warm, no rashes.   LABS: I have reviewed all of today's lab results. Relevant abnormalities are discussed in the A/P section  CXR: edema pattern  ASSESSMENT / PLAN:  PULMONARY OETT  2/9 >>> A: Acute respiratory failure in post operative setting PAH - Peak pressure 73 on echo 2014 Pulmonary edema   P:   Full vent support SBT in AM  F/u CXR, ABG - reviewed, keep same MV Need neg balance for him to have successful extubation Wean cpap 5 ps 5, goal 2 hr, unlikely to extubate VAP bundle PRN xopenex  CARDIOVASCULAR A:  Chronic AF (no chronic anticoagulation due to GIB) - rate controlled Elevated troponin - suspect demand ischemia Post op hypotension Acute on Chronic Diastolic CHF Pronlinged QTc H/o HTN  P:  Telemetry monitoring F/u echo Repeat EKG in AM Trend troponin, then stop as flat in setting renal insuff Cardiology following Hold home antihypertensives \\will  need lasix today to get off vent  RENAL A:   Acute on CKD Lactic acidosis Lasix dependent at home P:   KVO IVF Follow Bmet Repeat  lactic, improved Lasix start  GASTROINTESTINAL A:   GI prophylaxis  P:   SUP: IV PPI statr TF   HEMATOLOGIC A:   Anemia, chronic   P:  Follow CBC Transfuse per ICU guidelines Heparin for VTE ppx  INFECTIOUS A:   Surgical prophylaxis  P:   UCx 2/9 > Abx: Clinda ,2/9 x 2 doses  Abx: cipro, start date 2/9 >>> Follow WBC and fever curve  ENDOCRINE A:   No acute issues  P:   Follow glucose on chemistry  NEUROLOGIC A:   Acute encephalopathy in post op setting  P:   RASS goal -1 Fentanyl gtt for sedation PRN midazolam Monitor   I spoke to 2 daughters @ bedside and discussed with Dr [name]. His prognosis is worsening with rising creatinine and poor  Uo. He is unable to come off vent due to pulmonary edema. He is not a candidate for HD, even short term. Our highest priority needs to be his comfort. DNR status reconfirmed with daughters   CCM X 77 mins  Merton Border, MD ; [facility] [phone].  After 5:30 PM or weekends, call [phone]   12/11/2014 4:14 PM

## 2014-12-11 NOTE — Progress Notes (Signed)
NUTRITION FOLLOW UP  Intervention:   Continue Vital High protein @ 50 ml/hr with 30 ml of pro-stat once daily  Nutrition Dx:   Inadequate oral intake related to inability to eat as evidenced by NPO/Vent status.   Goal: Enteral nutrition to provide 60-70% of estimated calorie needs (22-25 kcals/kg ideal body weight) and 100% of estimated protein needs, based on ASPEN guidelines for hypocaloric, high protein feeding in critically ill obese individuals; being met   Monitor:   Vent status, TF initiation/tolerance, weight trend, labs  Assessment:   81 year old patient with a history of severe pulmonary hypertension, moderate aortic stenosis, chronic diastolic heart failure with a close left hip fracture following a mechanical fall. Taken to OR 2/9 for left hip hemiarthroplasty and following OR, returned to ICU on vent.  Patient is currently intubated on ventilator support MV: 5.8 L/min Temp (24hrs), Avg:98.8 F (37.1 C), Min:97.9 F (36.6 C), Max:100.3 F (37.9 C)  Pt is tolerating tube feedings via OGT well. Vital High protein is infusing @ 50 ml/hr. 30 ml Prostat via tube once daily. Tube feeding regimen currently providing 1300 kcal, 120 grams protein, and 1008 ml H2O.   Free water flushes: 30 ml every 4 hrs providing 180 of free water.  Total free water: 1188 ml per day.  Residuals: 0-90 ml  Last bm: 2/8   Height: Ht Readings from Last 1 Encounters:  12/09/14 5' 5" (1.651 m)    Weight Status:   Wt Readings from Last 1 Encounters:  12/11/14 202 lb 9.6 oz (91.9 kg)    Re-estimated needs:  Kcal: 1744 (Underfeeding goal (22-25 kcal/kg): 1607-3710)  Protein: >/= 124 grams Fluid: 2.5 L/day  Skin: +2 generalized edema; +3 RLE and LLE edema  Diet Order: Diet NPO time specified   Intake/Output Summary (Last 24 hours) at 12/11/14 1639 Last data filed at 12/11/14 1600  Gross per 24 hour  Intake 1313.53 ml  Output    660 ml  Net 653.53 ml    Last BM:  2/8   Labs:   Recent Labs Lab 12/09/14 0654 12/10/14 0422 12/11/14 0400  NA 145 143 143  K 4.2 4.2 3.7  CL 110 112 111  CO2 _0 BUN 41* 52* 68*  CREATININE 2.58* 3.07* 3.96*  CALCIUM 8.8 8.2* 7.8*  GLUCOSE 118* 121* 123*    CBG (last 3)   Recent Labs  12/11/14 0333 12/11/14 0755 12/11/14 1127  GLUCAP 110* 114* 136*    Scheduled Meds: . antiseptic oral rinse  7 mL Mouth Rinse QID  . chlorhexidine  15 mL Mouth Rinse BID  . Chlorhexidine Gluconate Cloth  6 each Topical Q0600  . famotidine  20 mg Per Tube Daily  . feeding supplement (PRO-STAT SUGAR FREE 64)  30 mL Per Tube Daily  . furosemide  80 mg Intravenous Q12H  . heparin  5,000 Units Subcutaneous 3 times per day  . levothyroxine  75 mcg Oral QAC breakfast  . mupirocin ointment  1 application Nasal BID  . sodium chloride  10-40 mL Intracatheter Q12H    Continuous Infusions: . dexmedetomidine 0.2 mcg/kg/hr (12/11/14 1600)  . feeding supplement (VITAL HIGH PROTEIN) 1,000 mL (12/11/14 1600)  . fentaNYL infusion INTRAVENOUS Stopped (12/11/14 1600)  . phenylephrine (NEO-SYNEPHRINE) Adult infusion      Pryor Ochoa RD, LDN Inpatient Clinical Dietitian Pager: [phone] After Hours Pager: [phone]

## 2014-12-11 NOTE — Progress Notes (Signed)
RT note- wean failed, HR 130's, agitation with possible anxiety, cont to monitor for weaning.

## 2014-12-11 NOTE — Progress Notes (Signed)
OT Cancellation Note  Patient Details Name: OBED SAMEK MRN: [id number] DOB: [birth year]   Cancelled Treatment:    Reason Eval/Treat Not Completed: Medical issues which prohibited therapy (Pt remains on ventilator.)  Malka So 12/11/2014, 3:52 PM

## 2014-12-11 NOTE — Progress Notes (Signed)
TELEMETRY: Reviewed telemetry pt in Atrial fibrillation with controlled rate 70s: Filed Vitals:   12/11/14 0700 12/11/14 0800 12/11/14 0900 12/11/14 1100  BP: 113/62 116/99 124/96   Pulse: 92 107 66   Temp: 98.2 F (36.8 C)   97.9 F (36.6 C)  TempSrc: Oral   Axillary  Resp: 20 21 28    Height:      Weight:      SpO2: 96% 98% 94%     Intake/Output Summary (Last 24 hours) at 12/11/14 1256 Last data filed at 12/11/14 1000  Gross per 24 hour  Intake    988 ml  Output    580 ml  Net    408 ml   Filed Weights   12/09/14 1049 12/11/14 0100  Weight: 199 lb 6.4 oz (90.447 kg) 202 lb 9.6 oz (91.9 kg)    Subjective Awake on vent. Patient had left hemiarthroplasty 11/06/49 complicated by hypotension with BP down to 50 requiring pressors. Pressors DC'd before arrival in unit. BP stable overnight. Metoprolol held. Now on vent, unable to wean. Awake and responds to commands  . antiseptic oral rinse  7 mL Mouth Rinse QID  . chlorhexidine  15 mL Mouth Rinse BID  . Chlorhexidine Gluconate Cloth  6 each Topical Q0600  . famotidine  20 mg Per Tube Daily  . feeding supplement (PRO-STAT SUGAR FREE 64)  30 mL Per Tube Daily  . furosemide  80 mg Intravenous Q12H  . heparin  5,000 Units Subcutaneous 3 times per day  . levothyroxine  75 mcg Oral QAC breakfast  . mupirocin ointment  1 application Nasal BID  . sodium chloride  10-40 mL Intracatheter Q12H   . dexmedetomidine    . feeding supplement (VITAL HIGH PROTEIN) 1,000 mL (12/11/14 1000)  . fentaNYL infusion INTRAVENOUS 25 mcg/hr (12/11/14 1000)  . phenylephrine (NEO-SYNEPHRINE) Adult infusion      LABS: Basic Metabolic Panel:  Recent Labs  12/10/14 0422 12/11/14 0400  NA 143 143  K 4.2 3.7  CL 112 111  CO2 23 25  GLUCOSE 121* 123*  BUN 52* 68*  CREATININE 3.07* 3.96*  CALCIUM 8.2* 7.8*   Liver Function Tests:  Recent Labs  12/09/14 0654 12/11/14 0400  AST 37 95*  ALT 25 79*  ALKPHOS 90 72  BILITOT 1.7* 0.8  PROT  7.0 5.5*  ALBUMIN 2.6* 2.1*   No results for input(s): LIPASE, AMYLASE in the last 72 hours. CBC:  Recent Labs  12/10/14 0422 12/11/14 0400  WBC 10.3 9.7  NEUTROABS  --  8.2*  HGB 8.9* 8.4*  HCT 27.8* 26.1*  MCV 88.5 87.3  PLT 120* 127*   Cardiac Enzymes:  Recent Labs  12/10/14 0144 12/10/14 0422 12/10/14 0644  TROPONINI 1.44* 1.42* 1.37*    Thyroid Function Tests:  Recent Labs  12/08/14 1501  TSH 2.900     Radiology/Studies:  Pelvis Portable  12/09/2014   CLINICAL DATA:  Postop left anterior hip.  EXAM: PORTABLE PELVIS 1-2 VIEWS  COMPARISON:  12/08/2014  FINDINGS: Interval placement of a left hemiarthroplasty using uncemented femoral component. Components appear well seated. No evidence of dislocation of the hip joint. Mild degenerative changes in the right hip. Vascular calcifications.  IMPRESSION: Left hip hemiarthroplasty with components appearing well-seated.   Electronically Signed   By: Lucienne Capers M.D.   On: 12/09/2014 21:30   Dg Chest Port 1 View  12/11/2014   CLINICAL DATA:  81 year old male status post left hip hemiarthroplasty. Pulmonary edema. Initial  encounter.  EXAM: PORTABLE CHEST - 1 VIEW  COMPARISON:  T1 2016 and earlier.  FINDINGS: Portable AP semi upright view at 0646 hours. Endotracheal tube tip in stable position. Enteric tube courses to the abdomen and is stable. Stable cardiac size and mediastinal contours. Sequelae of CABG. Bilateral veiling pulmonary opacities and dense retrocardiac opacity. Pulmonary vascularity has not significantly changed. No pneumothorax.  IMPRESSION: 1.  Stable lines and tubes. 2. Stable pulmonary vascular congestion/interstitial edema with bilateral pleural effusions and bibasilar collapse/consolidation.   Electronically Signed   By: Genevie Ann M.D.   On: 12/11/2014 08:07   Dg Chest Port 1 View  12/10/2014   CLINICAL DATA:  Status post left hip hemiarthroplasty, check endotracheal tube placement  EXAM: PORTABLE CHEST - 1  VIEW  COMPARISON:  12/09/2014  FINDINGS: Endotracheal tube is again seen and stable at 5 cm above the carina. Postsurgical changes are again noted. Nasogastric catheter extends into the stomach. Bibasilar atelectatic changes and effusions are again seen and relatively stable from the prior exam given some change in the patient positioning. No acute bony abnormality is seen.  IMPRESSION: Stable bibasilar atelectasis and effusions.   Electronically Signed   By: Inez Catalina M.D.   On: 12/10/2014 08:31   Portable Chest Xray  12/09/2014   CLINICAL DATA:  Acute respiratory failure.  EXAM: PORTABLE CHEST - 1 VIEW  COMPARISON:  12/09/2014  FINDINGS: Endotracheal tube tip just below the clavicular heads. Gastric suction tube reaches the stomach.  Progressive opacification of the mid and lower chest with hazy appearance consistent with pleural fluid and atelectasis. The underlying lungs are atelectatic or consolidated. Cardiomegaly with pulmonary venous congestion. No air leak.  IMPRESSION: 1. New endotracheal and orogastric tubes are in good position. 2. Enlarged or layering pleural effusions with atelectasis or consolidation. 3. Pulmonary venous congestion.   Electronically Signed   By: Monte Fantasia M.D.   On: 12/09/2014 20:08    2D ECHO Study Date: 12/10/2014 LV EF: 50% -  55% Study Conclusions - Left ventricle: The cavity size was normal. There was moderate concentric hypertrophy. Systolic function was normal. The estimated ejection fraction was in the range of 50% to 55%. Wall motion was normal; there were no regional wall motion abnormalities. The study was not technically sufficient to allow evaluation of LV diastolic dysfunction due to atrial fibrillation. - Aortic valve: Trileaflet; severely thickened, severely calcified leaflets. Valve mobility was restricted. There was moderate stenosis. There was trivial regurgitation. Mean gradient (S): 18 mm Hg. Valve area (VTI): 0.74  cm^2. Valve area (Vmax): 0.72 cm^2. Valve area (Vmean): 0.7 cm^2. - Aortic root: The aortic root was normal in size. - Mitral valve: Calcified annulus. Moderately thickened, moderately calcified leaflets . Mobility was restricted. There was moderate regurgitation. Valve area by continuity equation (using LVOT flow): 1.47 cm^2. - Left atrium: The atrium was severely dilated. - Right ventricle: The cavity size was moderately dilated. Wall thickness was normal. - Right atrium: The atrium was severely dilated. - Tricuspid valve: There was moderate-severe regurgitation. - Pulmonary arteries: Systolic pressure was moderately to severely increased. PA peak pressure: 55 mm Hg (S). Impressions: - There is no significant difference when compared to the study from 04/16/2013.   Ecg: AFib with controlled rate. QTc 492 msec. No acute change.    PHYSICAL EXAM General: Elderly, chronically ill male, on vent but arousable. Head: Normocephalic, atraumatic, sclera non-icteric, oropharynx is clear Neck: Negative for carotid bruits. JVD is elevated. No adenopathy Lungs: Clear anteriorly  on left. Some faint rhonchi on right. Heart: IRRR S1 S2 with grade 2/6 systolic murmur of AS. Abdomen: Soft, non-tender, non-distended with normoactive bowel sounds. No hepatomegaly. No rebound/guarding. No obvious abdominal masses. Extremities: 1+edema.  Distal pedal pulses are 2+ and equal bilaterally. Neuro: Sedated on vent but awake. Moves all extremities spontaneously.   ASSESSMENT AND PLAN: 1. Closed left hip fracture. s/p left hemiarthroplasty.   2. Chronic diastolic CHF. BNP elevated. He has lower extremity edema. Unable to assess diuretic effect.  -- 2D ECHO yesterday with EF 50-55%, no RWMA, mod AS, mod MR, severe LA dilation, mod-sev TR, PA pk pressure 55. -- CXR with stable pulmonary vascular congestion/interstitial edema with bilateral pleural effusions and bibasilar  collapse/consolidation. -- Volume overloaded on exam. Given 80mg  IV lasix this AM by  PCCM  3. Demand ischemia with elevated troponins. Flat troponin curve. Related to CHF, CKD, respiratory problems and recent stress of fracture. No chest pain. Ecg without acute ST changes this am.  4. Afib. Rate controlled currently in 100s. Rates did go up this AM when they unsuccessfully tried to wean the vent. Metoprolol held due to hypotension. Will monitor. Not a candidate for long term anticoagulation due to GIB.   5. Acute on CKD. Creatinine increased 2.3>2.58>3.07> 3.96. Suspect component of ATN with hypotension intraop.  6. Anemia chronic  7. HTN- BP imroving  8. Moderate aortic stenosis- repeat 2d ECHO Mean gradient (S): 32mm Hg. Valve area (VTI): 0.74 cm^2. Valve area (Vmax): 0.72 cm^2. Valve area (Vmean): 0.7 cm^2.  9. CAD s/p CABG  10. Acute respiratory failure. Vent management per CCM. On broad spectrum antibiotics.   Present on Admission:  . Atrial fibrillation . CKD (chronic kidney disease) stage 3, GFR 30-59 ml/min . Anemia . (Resolved) CAD (coronary artery disease) of artery bypass graft . HTN (hypertension) . Dyslipidemia . Closed fracture of left hip . Chronic diastolic heart failure, NYHA class 1 . Hip fracture requiring operative repair . Closed left hip fracture  Signed, Eileen Stanford, PA-CFACC 12/11/2014 12:56 PM   Patient seen and examined and history reviewed. Agree with above findings and plan. Patient still requiring vent support. Unable to wean with hypotension. Worsening renal function due to ATN. Afib rate is controlled. IV diuresis.  Peter Martinique, [place] 12/11/2014 3:26 PM

## 2014-12-11 NOTE — Progress Notes (Signed)
PT Cancellation Note  Patient Details Name: [name] [name] MRN: [id number] DOB: 15-Dec-1920   Cancelled Treatment:    Reason Eval/Treat Not Completed: Medical issues which prohibited therapy (Pt remains on vent and not yet medically appropriate. )    Lanetta Inch Beth 12/11/2014, 7:19 AM Elwyn Reach, [place]

## 2014-12-11 NOTE — Progress Notes (Signed)
RT Note - Pt changed to Precidex for agitation and weaning. Pt failed wean due to excessive MV or not enough.

## 2014-12-12 ENCOUNTER — Inpatient Hospital Stay (HOSPITAL_COMMUNITY): Payer: Medicare Other

## 2014-12-12 DIAGNOSIS — J810 Acute pulmonary edema: Secondary | ICD-10-CM | POA: Insufficient documentation

## 2014-12-12 LAB — URINE CULTURE: Colony Count: >=100000

## 2014-12-12 LAB — BASIC METABOLIC PANEL
Anion gap: 10 (ref 5–15)
BUN: 83 mg/dL — ABNORMAL HIGH (ref 6–23)
CALCIUM: 8.2 mg/dL — AB (ref 8.4–10.5)
CHLORIDE: 111 mmol/L (ref 96–112)
CO2: 22 mmol/L (ref 19–32)
CREATININE: 4.29 mg/dL — AB (ref 0.50–1.35)
GFR calc Af Amer: 13 mL/min — ABNORMAL LOW (ref >90)
GFR, EST NON AFRICAN AMERICAN: 11 mL/min — AB (ref >90)
Glucose, Bld: 137 mg/dL — ABNORMAL HIGH (ref 70–99)
Potassium: 3.4 mmol/L — ABNORMAL LOW (ref 3.5–5.1)
Sodium: 143 mmol/L (ref 135–145)

## 2014-12-12 LAB — CBC
HCT: 24.8 % — ABNORMAL LOW (ref 39.0–52.0)
Hemoglobin: 8.1 g/dL — ABNORMAL LOW (ref 13.0–17.0)
MCH: 27.9 pg (ref 26.0–34.0)
MCHC: 32.7 g/dL (ref 30.0–36.0)
MCV: 85.5 fL (ref 78.0–100.0)
Platelets: 118 10*3/uL — ABNORMAL LOW (ref 150–400)
RBC: 2.9 MIL/uL — ABNORMAL LOW (ref 4.22–5.81)
RDW: 17.8 % — ABNORMAL HIGH (ref 11.5–15.5)
WBC: 7.7 10*3/uL (ref 4.0–10.5)

## 2014-12-12 LAB — CLOSTRIDIUM DIFFICILE BY PCR: Toxigenic C. Difficile by PCR: NEGATIVE

## 2014-12-12 MED ORDER — SODIUM CHLORIDE 0.9 % IV SOLN
INTRAVENOUS | Status: DC
  Administered 2014-12-12: 16:00:00 via INTRAVENOUS

## 2014-12-12 MED ORDER — POTASSIUM CHLORIDE 20 MEQ/15ML (10%) PO SOLN
40.0000 meq | Freq: Once | ORAL | Status: AC
  Administered 2014-12-12: 40 meq
  Filled 2014-12-12 (×2): qty 30

## 2014-12-12 MED ORDER — POTASSIUM CHLORIDE 20 MEQ/15ML (10%) PO SOLN
ORAL | Status: AC
  Filled 2014-12-12: qty 15

## 2014-12-12 MED ORDER — MORPHINE SULFATE 2 MG/ML IJ SOLN
1.0000 mg | INTRAMUSCULAR | Status: DC | PRN
  Administered 2014-12-12 – 2014-12-18 (×9): 2 mg via INTRAVENOUS
  Filled 2014-12-12 (×9): qty 1

## 2014-12-12 NOTE — Progress Notes (Signed)
PT Cancellation Note/ Discharge  Patient Details Name: REICE BIENVENUE MRN: [id number] DOB: 05-22-1921   Cancelled Treatment:    Reason Eval/Treat Not Completed: Medical issues which prohibited therapy (Pt remains on vent with medical complexity and not appropriate for therapy at this time. will sign off and await new orders)   Lanetta Inch [facility] 12/12/2014, 7:06 AM Elwyn Reach, [place]

## 2014-12-12 NOTE — Progress Notes (Signed)
OT Cancellation Note  Patient Details Name: [name] [name] MRN: [id number] DOB: July 11, 1921   Cancelled Treatment:    Reason Eval/Treat Not Completed: Medical issues which prohibited therapy. Pt remains on ventilator. Signing off. Please reorder as appropriate.  Malka So 12/12/2014, 7:43 AM

## 2014-12-12 NOTE — Progress Notes (Signed)
Patient with 2 loose stools today already.  Specimen sent for CDiff per hospital protocol.  Flexiseal inserted with return of stool in tubing noted.  Dr. Elsworth Soho aware.

## 2014-12-12 NOTE — Clinical Social Work Note (Signed)
Patient still on ventilator, will continue to monitor patient's progress.  Jones Broom. [place], MSW, [place] 12/12/2014 4:57 PM

## 2014-12-12 NOTE — Procedures (Signed)
Extubation Procedure Note  Patient Details:   Name: [name] [name] DOB: 1921-01-10 MRN: [id number]   Airway Documentation:     Evaluation  O2 sats: stable throughout Complications: No apparent complications Patient did tolerate procedure well. Bilateral Breath Sounds: Rhonchi Suctioning: Airway Yes  Patient tolerated wean. MD ordered to extubate. Positive for cuff leak. Patient extubated to a 2 Lpm nasal cannula. No signs of dyspnea or stridor noted. Patient resting comfortably.  Myrtie Neither 12/12/2014, 11:05 AM

## 2014-12-12 NOTE — Plan of Care (Signed)
Problem: Phase I Progression Outcomes Goal: Initial discharge plan identified Outcome: Completed/Met Date Met:  12/12/14 To discharge to SNF

## 2014-12-12 NOTE — Progress Notes (Signed)
PULMONARY / CRITICAL CARE MEDICINE   Name: [name] [name] MRN: [id number] DOB: 1921/05/31    ADMISSION DATE:  12/08/2014 CONSULTATION DATE:  12/12/2014  Service provided 12/10/14  REFERRING MD :  [name]  CHIEF COMPLAINT:  Left femoral fx s/p left hemiarthroplasty 2/9, back to ICU on vent  INITIAL PRESENTATION:  81 y.o. M who sustained left hip fx on 2/8 after mechanical fall at home.  Taken to OR 2/9 for left hip hemiarthroplasty and following OR, returned to ICU on vent.  PCCM consulted for vent management.  STUDIES:  L Hip X Ray 2/8 >>> acute subcapital fx left hip. CXR 2/8 >>> mild interstitial edema with associated small b/l pleural effusions  SIGNIFICANT EVENTS: 2/8 - admitted with L hip fx 2/9 - left hip hemiarthroplasty [name]).  Returned to OR on vent, PCCM consulted. 2/10 TTE: moderate concentric hypertrophy. LVEF 50-55% 2/12 Passed SBT. Extubated. Tolerated well initially   SUBJECTIVE: r Passed SBT. Extubated and looks good. Strong cough. + F/C  VITAL SIGNS: Temp:  [96.7 F (35.9 C)-99.1 F (37.3 C)] 96.7 F (35.9 C) (02/12 1237) Pulse Rate:  [66-114] 107 (02/12 1300) Resp:  [14-32] 24 (02/12 1300) BP: (82-125)/(43-85) 106/50 mmHg (02/12 1300) SpO2:  [89 %-100 %] 100 % (02/12 1300) Arterial Line BP: (84-173)/(43-169) 121/53 mmHg (02/12 1300) FiO2 (%):  [30 %] 30 % (02/12 0800) Weight:  [90.1 kg (198 lb 10.2 oz)] 90.1 kg (198 lb 10.2 oz) (02/12 0153) HEMODYNAMICS:   VENTILATOR SETTINGS: Vent Mode:  [-] PSV;CPAP FiO2 (%):  [30 %] 30 % Set Rate:  [16 bmp] 16 bmp Vt Set:  [550 mL] 550 mL PEEP:  [5 cmH20] 5 cmH20 Pressure Support:  [10 cmH20-14 cmH20] 10 cmH20 Plateau Pressure:  [19 cmH20-23 cmH20] 19 cmH20 INTAKE / OUTPUT: Intake/Output      02/11 0701 - 02/12 0700 02/12 0701 - 02/13 0700   I.V. (mL/kg) 162.6 (1.8) 22.8 (0.3)   NG/GT 1330    Total Intake(mL/kg) 1492.6 (16.6) 22.8 (0.3)   Urine (mL/kg/hr) 1645 (0.8) 600 (1.1)   Total Output 1645 600   Net  -152.4 -577.2          PHYSICAL EXAMINATION: General: RASS 0, + F/C Neuro: No focal deficits HEENT: WNL Cardiovascular: Reg, no M  Lungs: Coarse bilaterally. Abdomen: BS x 4, soft, NT/ND.  Musculoskeletal: Chronic stasis changes, 1+ LE pitting edema.    LABS: I have reviewed all of today's lab results. Relevant abnormalities are discussed in the A/P section  CXR: [place] edema pattern with effusions  ASSESSMENT / PLAN:  PULMONARY OETT  2/9 >> 2/12 A: Acute respiratory failure in post operative setting PAH - Peak pressure 73 on echo 2014 Pulmonary edema  P:   Cont diuresis as permitted by BP and renal function Supp O2  PRN BiPAP post extubation DNI - confirmed with daughters 2/12 If unable to be comfortable with the above measures, initiate PRN morphine for dyspnea  CARDIOVASCULAR A:  Chronic AF (no chronic anticoagulation due to prior GIB) - rate controlled Elevated troponin - suspect demand ischemia Post op hypotension persists Acute on Chronic Diastolic CHF Prolonged QTc H/o HTN P:  Cont telemetry monitoring Repeat EKG in AM Cardiology following Holding home antihypertensives  RENAL A:   AKI, nonoliguric CKD Lactic acidosis, resolved Lasix dependent at home P:   Monitor BMET intermittently Monitor I/Os Correct electrolytes as indicated  GASTROINTESTINAL A:   Post extubation dysphagia P:   SUP: N/I post extubation NPO for now. Advance  diet as tolerated   HEMATOLOGIC A:   Anemia, chronic Acute blood loss anemia (peri-op) - no active bleeding presently Mild thrombocytopenia P:  DVT px: SQ heparin Monitor CBC intermittently Transfuse per usual ICU guidelines  INFECTIOUS A:   No active issues P:   Monitor temp, WBC count  ENDOCRINE A:   No acute issues P:   Follow glucose on chemistry  NEUROLOGIC A:   Acute encephalopathy, resolved Post op pain P:   RASS goal 0 Low dose PRN morphine for pain   I spoke to 2 daughters @ bedside.  I explained that we have probably reached the point of maximum benefit of vent support and that this is our window of opportunity to get him extubated. However, if these efforts fail, there is no reason to believe that going back and doing it again a second time would be any more likely to be successful. As such, we all agree that he will not be re-intubated. He is full DNR/DNI and comfort measures are to be undertaken if he develops an unacceptable level of discomfort   CCM X 35 mins  Merton Border, MD ; [facility] service Mobile [phone].  After 5:30 PM or weekends, call [phone]   12/12/2014 1:13 PM

## 2014-12-12 NOTE — Addendum Note (Signed)
Addendum  created 12/12/14 1025 by Napoleon Form, MD   Modules edited: Anesthesia Attestations

## 2014-12-12 NOTE — Progress Notes (Signed)
TELEMETRY: Reviewed telemetry pt in Atrial fibrillation with controlled rate 70s: Filed Vitals:   12/12/14 0700 12/12/14 0715 12/12/14 0755 12/12/14 0816  BP: 123/65  123/65   Pulse: 75 80 88   Temp:    97.9 F (36.6 C)  TempSrc:    Axillary  Resp: 16 18 17    Height:      Weight:      SpO2: 99% 99% 99%     Intake/Output Summary (Last 24 hours) at 12/12/14 0839 Last data filed at 12/12/14 0600  Gross per 24 hour  Intake 1410.14 ml  Output   1565 ml  Net -154.86 ml   Filed Weights   12/09/14 1049 12/11/14 0100 12/12/14 0153  Weight: 199 lb 6.4 oz (90.447 kg) 202 lb 9.6 oz (91.9 kg) 198 lb 10.2 oz (90.1 kg)    Subjective Sedated but easily awakes on vent. Patient had left hemiarthroplasty 11/05/15 complicated by hypotension with BP down to 50 requiring pressors.Now on vent, unable to wean yesterday.   Marland Kitchen antiseptic oral rinse  7 mL Mouth Rinse QID  . chlorhexidine  15 mL Mouth Rinse BID  . Chlorhexidine Gluconate Cloth  6 each Topical Q0600  . famotidine  20 mg Per Tube Daily  . feeding supplement (PRO-STAT SUGAR FREE 64)  30 mL Per Tube Daily  . heparin  5,000 Units Subcutaneous 3 times per day  . levothyroxine  75 mcg Oral QAC breakfast  . mupirocin ointment  1 application Nasal BID  . sodium chloride  10-40 mL Intracatheter Q12H   . dexmedetomidine 0.2 mcg/kg/hr (12/12/14 0600)  . feeding supplement (VITAL HIGH PROTEIN) 1,000 mL (12/12/14 0600)  . fentaNYL infusion INTRAVENOUS Stopped (12/11/14 1600)  . phenylephrine (NEO-SYNEPHRINE) Adult infusion 10 mcg/min (12/12/14 0600)    LABS: Basic Metabolic Panel:  Recent Labs  12/11/14 0400 12/12/14 0400  NA 143 143  K 3.7 3.4*  CL 111 111  CO2 25 22  GLUCOSE 123* 137*  BUN 68* 83*  CREATININE 3.96* 4.29*  CALCIUM 7.8* 8.2*   Liver Function Tests:  Recent Labs  12/11/14 0400  AST 95*  ALT 79*  ALKPHOS 72  BILITOT 0.8  PROT 5.5*  ALBUMIN 2.1*   No results for input(s): LIPASE, AMYLASE in the last 72  hours. CBC:  Recent Labs  12/11/14 0400 12/12/14 0400  WBC 9.7 7.7  NEUTROABS 8.2*  --   HGB 8.4* 8.1*  HCT 26.1* 24.8*  MCV 87.3 85.5  PLT 127* 118*   Cardiac Enzymes:  Recent Labs  12/10/14 0144 12/10/14 0422 12/10/14 0644  TROPONINI 1.44* 1.42* 1.37*    Thyroid Function Tests: No results for input(s): TSH, T4TOTAL, T3FREE, THYROIDAB in the last 72 hours.  Invalid input(s): FREET3   Radiology/Studies:  Dg Chest Port 1 View  12/12/2014   CLINICAL DATA:  Respiratory failure.  EXAM: PORTABLE CHEST - 1 VIEW  COMPARISON:  December 11, 2014.  FINDINGS: Stable cardiomegaly. Endotracheal and nasogastric tubes are in grossly good position and unchanged compared to prior exam. Status post coronary artery bypass graft. Left-sided PICC line is noted with distal tip overlying expected position of cavoatrial junction. Stable bibasilar opacities are noted consistent with pulmonary edema with associated pleural effusions. No pneumothorax is noted.  IMPRESSION: Stable support apparatus. Stable bibasilar opacities are noted consistent with pulmonary edema with associated pleural effusions.   Electronically Signed   By: Marijo Conception, M.D.   On: 12/12/2014 08:11   Dg Chest [facility]  12/11/2014   CLINICAL DATA:  Status post insertion of left-sided PICC line.  EXAM: PORTABLE CHEST - 1 VIEW  COMPARISON:  Same day.  FINDINGS: Stable cardiomegaly. Endotracheal nasogastric tubes are in grossly good position. Status post coronary artery bypass graft. Bilateral basilar densities are noted concerning for edema. Bilateral pleural effusions are noted. No pneumothorax is noted. Interval placement of left-sided PICC line with distal tip overlying the expected position of the SVC.  IMPRESSION: Stable support apparatus. Interval placement of left-sided PICC line with distal tip overlying expected position of the SVC. Moderate bilateral pleural effusions with associated pulmonary edema are noted.    Electronically Signed   By: Marijo Conception, M.D.   On: 12/11/2014 13:19   Dg Chest Port 1 View  12/11/2014   CLINICAL DATA:  81 year old male status post left hip hemiarthroplasty. Pulmonary edema. Initial encounter.  EXAM: PORTABLE CHEST - 1 VIEW  COMPARISON:  T1 2016 and earlier.  FINDINGS: Portable AP semi upright view at 0646 hours. Endotracheal tube tip in stable position. Enteric tube courses to the abdomen and is stable. Stable cardiac size and mediastinal contours. Sequelae of CABG. Bilateral veiling pulmonary opacities and dense retrocardiac opacity. Pulmonary vascularity has not significantly changed. No pneumothorax.  IMPRESSION: 1.  Stable lines and tubes. 2. Stable pulmonary vascular congestion/interstitial edema with bilateral pleural effusions and bibasilar collapse/consolidation.   Electronically Signed   By: Genevie Ann M.D.   On: 12/11/2014 08:07    2D ECHO Study Date: 12/10/2014 LV EF: 50% -  55% Study Conclusions - Left ventricle: The cavity size was normal. There was moderate concentric hypertrophy. Systolic function was normal. The estimated ejection fraction was in the range of 50% to 55%. Wall motion was normal; there were no regional wall motion abnormalities. The study was not technically sufficient to allow evaluation of LV diastolic dysfunction due to atrial fibrillation. - Aortic valve: Trileaflet; severely thickened, severely calcified leaflets. Valve mobility was restricted. There was moderate stenosis. There was trivial regurgitation. Mean gradient (S): 18 mm Hg. Valve area (VTI): 0.74 cm^2. Valve area (Vmax): 0.72 cm^2. Valve area (Vmean): 0.7 cm^2. - Aortic root: The aortic root was normal in size. - Mitral valve: Calcified annulus. Moderately thickened, moderately calcified leaflets . Mobility was restricted. There was moderate regurgitation. Valve area by continuity equation (using LVOT flow): 1.47 cm^2. - Left atrium: The atrium  was severely dilated. - Right ventricle: The cavity size was moderately dilated. Wall thickness was normal. - Right atrium: The atrium was severely dilated. - Tricuspid valve: There was moderate-severe regurgitation. - Pulmonary arteries: Systolic pressure was moderately to severely increased. PA peak pressure: 55 mm Hg (S). Impressions: - There is no significant difference when compared to the study from 04/16/2013.   Ecg: 12/10/14: AFib with controlled rate. QTc 492 msec. No acute change.    PHYSICAL EXAM General: Elderly, chronically ill male, on vent but arousable. Head: Normocephalic, atraumatic, sclera non-icteric, oropharynx is clear Neck: Negative for carotid bruits. JVD is elevated. No adenopathy Lungs: Clear anteriorly on left. Some faint rhonchi on right. Heart: IRRR S1 S2 with grade 2/6 systolic murmur of AS. Abdomen: Soft, non-tender, non-distended with normoactive bowel sounds. No hepatomegaly. No rebound/guarding. No obvious abdominal masses. Extremities: tr-1+edema.  Distal pedal pulses are 2+ and equal bilaterally. Neuro: Sedated on vent but awake. Moves all extremities spontaneously.   ASSESSMENT AND PLAN: 1. Closed left hip fracture. s/p left hemiarthroplasty.   2. Chronic diastolic CHF. BNP elevated. He has lower extremity  edema. Urine output improved. I/O mildly positive.  -- 2D ECHO yesterday with EF 50-55%, no RWMA, mod AS, mod MR, severe LA dilation, mod-sev TR, PA pk pressure 55. -- CXR with stable pulmonary vascular congestion/interstitial edema with bilateral pleural effusions and bibasilar collapse/consolidation. -- Volume overloaded on exam. Diuresis limited by low BP and ARF. On lasix 80 mg IV bid.  3. Demand ischemia with elevated troponins. Flat troponin curve. Related to CHF, CKD, respiratory problems and recent stress of fracture. No chest pain. Ecg without acute ST changes.  4. Afib. Rate controlled.  Will monitor. Not a candidate for long  term anticoagulation due to GIB. Metoprolol held due to hypotension.  5. Acute on CKD. Creatinine increased 2.3>2.58>3.07> 3.96>4.29. Suspect  ATN with hypotension intraop. nonoliguric  6. Anemia acute on chronic (anemia of critical illness)  7. HTN- now hypotensive. BP stable on phenylephrine.  8. Moderate aortic stenosis- repeat 2d ECHO Mean gradient (S): 28mm Hg. Valve area (VTI): 0.74 cm^2. Valve area (Vmax): 0.72 cm^2. Valve area (Vmean): 0.7 cm^2.  9. CAD s/p CABG  10. Acute respiratory failure. Vent management per CCM. On broad spectrum antibiotics.   Present on Admission:  . Atrial fibrillation . CKD (chronic kidney disease) stage 3, GFR 30-59 ml/min . Anemia . (Resolved) CAD (coronary artery disease) of artery bypass graft . HTN (hypertension) . Dyslipidemia . Closed fracture of left hip . Chronic diastolic heart failure, NYHA class 1 . Hip fracture requiring operative repair . Closed left hip fracture  Signed,   Julianna Vanwagner [place], [place] 12/12/2014 8:39 AM

## 2014-12-13 DIAGNOSIS — S72002D Fracture of unspecified part of neck of left femur, subsequent encounter for closed fracture with routine healing: Secondary | ICD-10-CM

## 2014-12-13 DIAGNOSIS — N390 Urinary tract infection, site not specified: Secondary | ICD-10-CM

## 2014-12-13 LAB — BASIC METABOLIC PANEL
ANION GAP: 10 (ref 5–15)
BUN: 88 mg/dL — AB (ref 6–23)
CALCIUM: 8.4 mg/dL (ref 8.4–10.5)
CO2: 27 mmol/L (ref 19–32)
Chloride: 110 mmol/L (ref 96–112)
Creatinine, Ser: 4.35 mg/dL — ABNORMAL HIGH (ref 0.50–1.35)
GFR calc Af Amer: 12 mL/min — ABNORMAL LOW (ref >90)
GFR calc non Af Amer: 11 mL/min — ABNORMAL LOW (ref >90)
Glucose, Bld: 102 mg/dL — ABNORMAL HIGH (ref 70–99)
Potassium: 3.9 mmol/L (ref 3.5–5.1)
SODIUM: 147 mmol/L — AB (ref 135–145)

## 2014-12-13 MED ORDER — CHLORHEXIDINE GLUCONATE 0.12 % MT SOLN
15.0000 mL | Freq: Two times a day (BID) | OROMUCOSAL | Status: DC
  Administered 2014-12-13 – 2014-12-21 (×16): 15 mL via OROMUCOSAL
  Filled 2014-12-13 (×22): qty 15

## 2014-12-13 MED ORDER — CETYLPYRIDINIUM CHLORIDE 0.05 % MT LIQD
7.0000 mL | Freq: Two times a day (BID) | OROMUCOSAL | Status: DC
  Administered 2014-12-13 – 2014-12-21 (×17): 7 mL via OROMUCOSAL

## 2014-12-13 MED FILL — Medication: Qty: 1 | Status: AC

## 2014-12-13 NOTE — Evaluation (Signed)
Physical Therapy Evaluation Patient Details Name: [name] [name] MRN: [id number] DOB: 1921/10/10 Today's Date: 12/13/2014   History of Present Illness  81 y.o. M who sustained left hip fx on 2/8 after mechanical fall at home. Taken to OR 2/9 for left hip hemiarthroplasty and following OR, returned to ICU on vent. Course complicated by AKI. Was living at home with hospice care.  Clinical Impression  Patient is seen following the above procedure and presents with functional limitations due to the deficits listed below (see PT Problem List). Max assist +2 with bed mobility. Progressed with unsupported sitting at edge of bed for 1 minute today; sat for 5 minutes total at edge of bed with HR elevating to mid 120s. SpO2 stable in upper 90s on 3L supplemental O2. Patient will benefit from skilled PT to increase their independence and safety with mobility to allow discharge to the venue listed below.       Follow Up Recommendations SNF    Equipment Recommendations  None recommended by PT    Recommendations for Other Services OT consult     Precautions / Restrictions Precautions Precautions: None (Per ortho MD, no hip precautions.) Restrictions Weight Bearing Restrictions: Yes LLE Weight Bearing: Weight bearing as tolerated      Mobility  Bed Mobility Overal bed mobility: Needs Assistance;+2 for physical assistance Bed Mobility: Supine to Sit;Sit to Supine     Supine to sit: Max assist;+2 for physical assistance;HOB elevated Sit to supine: Max assist;+2 for physical assistance   General bed mobility comments: Max assist +2 for truncal and LLE support to sit edge of bed and return to bed. VC throughout for technique. Pt able to follow simple commands occasionally. Needed total assist to scoot up in bed. Required mod assist intially for sitting edge of bed but progressed to min guard for approximately 1 minute.  Transfers                    Ambulation/Gait                 Stairs            Wheelchair Mobility    Modified Rankin (Stroke Patients Only)       Balance Overall balance assessment: Needs assistance Sitting-balance support: Bilateral upper extremity supported;Feet supported Sitting balance-Leahy Scale: Poor Sitting balance - Comments: Sat edge of bed for 5 minutes working on truncal control. Initially required mod assist due to posterior lean but progressed to min guard at edge of bed for 1 minute before becoming fatigued and required to lie back down. Postural control: Posterior lean                                   Pertinent Vitals/Pain Pain Assessment: No/denies pain Faces Pain Scale: Hurts little more Pain Location: Lt hip Pain Descriptors / Indicators: Moaning Pain Intervention(s): Monitored during session;Repositioned;Limited activity within patient's tolerance    Home Living Family/patient expects to be discharged to:: Skilled nursing facility Living Arrangements: Alone Available Help at Discharge: Personal care attendant (was receiving hospice care) Type of Home: House         Home Equipment: Gilford Rile - 4 wheels;Bedside commode;Shower seat Additional Comments: Per daughter. Pt had caregivers visit house througout the day but did not have 24 hour care.    Prior Function Level of Independence: Needs assistance   Gait / Transfers Assistance Needed: ambulatory with rollator  ADL's / Homemaking Assistance Needed: Needed assist for bath/dress        Hand Dominance        Extremity/Trunk Assessment   Upper Extremity Assessment: Defer to OT evaluation           Lower Extremity Assessment: LLE deficits/detail   LLE Deficits / Details: decreased strength and ROM as expected post op with guarding.     Communication   Communication: No difficulties  Cognition Arousal/Alertness: Awake/alert Behavior During Therapy: WFL for tasks assessed/performed Overall Cognitive Status: No  family/caregiver present to determine baseline cognitive functioning                      General Comments General comments (skin integrity, edema, etc.): BP 103/54. HR 104. SPO2 98% on 3L supplemental O2. Small abrasion noted distal to Lt scapula.    Exercises General Exercises - Lower Extremity Ankle Circles/Pumps: AROM;Both;10 reps;Supine Quad Sets: Strengthening;Left;5 reps;Supine Long Arc Quad: AAROM;Strengthening;Both;20 reps Heel Slides: Strengthening;Both;AAROM;10 reps;Supine Hip ABduction/ADduction: AAROM;Strengthening;Both;10 reps;Supine      Assessment/Plan    PT Assessment Patient needs continued PT services  PT Diagnosis Difficulty walking;Generalized weakness;Acute pain   PT Problem List Decreased strength;Decreased range of motion;Decreased activity tolerance;Decreased balance;Decreased mobility;Decreased cognition;Decreased coordination;Decreased knowledge of use of DME;Cardiopulmonary status limiting activity;Pain  PT Treatment Interventions DME instruction;Gait training;Functional mobility training;Therapeutic activities;Therapeutic exercise;Balance training;Neuromuscular re-education;Cognitive remediation;Patient/family education;Modalities   PT Goals (Current goals can be found in the Care Plan section) Acute Rehab PT Goals Patient Stated Goal: none stated PT Goal Formulation: With family Time For Goal Achievement: 12/27/14 Potential to Achieve Goals: Fair    Frequency Min 3X/week   Barriers to discharge Decreased caregiver support lives alone    Co-evaluation               End of Session   Activity Tolerance: Patient limited by fatigue Patient left: in bed;with call bell/phone within reach Nurse Communication: Mobility status;Precautions         Time: 3419-3790 PT Time Calculation (min) (ACUTE ONLY): 26 min   Charges:   PT Evaluation $Initial PT Evaluation Tier I: 1 Procedure PT Treatments $Therapeutic Exercise: 8-22 mins   PT  G Codes:        Ellouise Newer 12/13/2014, 1:09 PM Elayne Snare, [place]

## 2014-12-13 NOTE — Progress Notes (Signed)
PULMONARY / CRITICAL CARE MEDICINE   Name: [name] [name] MRN: [id number] DOB: [birth year]    ADMISSION DATE:  12/08/2014 CONSULTATION DATE:  12/13/2014  Service provided 12/10/14  REFERRING MD :  [name]  CHIEF COMPLAINT:  Left femoral fx s/p left hemiarthroplasty 2/9, back to ICU on vent  INITIAL PRESENTATION:  81 y.o. M who sustained left hip fx on 2/8 after mechanical fall at home.  Taken to OR 2/9 for left hip hemiarthroplasty and following OR, returned to ICU on vent.  PCCM consulted for vent management. Course complicated by AKI  STUDIES:  L Hip X Ray 2/8 >>> acute subcapital fx left hip. CXR 2/8 >>> mild interstitial edema with associated small b/l pleural effusions  SIGNIFICANT EVENTS: 2/8 - admitted with L hip fx 2/9 - left hip hemiarthroplasty [name]).  Returned to OR on vent, PCCM consulted. 2/10 TTE: moderate concentric hypertrophy. LVEF 50-55% 2/12 Passed SBT. Extubated. Tolerated well initially   SUBJECTIVE: tolerated extubation Appears tired Afebrile UO picking up  VITAL SIGNS: Temp:  [96.7 F (35.9 C)-98.3 F (36.8 C)] 97.4 F (36.3 C) (02/13 0300) Pulse Rate:  [68-120] 89 (02/13 0700) Resp:  [14-29] 24 (02/13 0700) BP: (92-143)/(40-108) 92/53 mmHg (02/13 0700) SpO2:  [95 %-100 %] 100 % (02/13 0700) Arterial Line BP: (120-163)/(53-71) 134/54 mmHg (02/12 1600) FiO2 (%):  [30 %] 30 % (02/12 0800) Weight:  [90 kg (198 lb 6.6 oz)] 90 kg (198 lb 6.6 oz) (02/13 0500) HEMODYNAMICS:   VENTILATOR SETTINGS: Vent Mode:  [-] PSV;CPAP FiO2 (%):  [30 %] 30 % PEEP:  [5 cmH20] 5 cmH20 Pressure Support:  [10 cmH20] 10 cmH20 INTAKE / OUTPUT: Intake/Output      02/12 0701 - 02/13 0700 02/13 0701 - 02/14 0700   I.V. (mL/kg) 167.6 (1.9)    NG/GT     Total Intake(mL/kg) 167.6 (1.9)    Urine (mL/kg/hr) 2600 (1.2)    Total Output 2600     Net -2432.4            PHYSICAL EXAMINATION: General: RASS 0, + F/C Neuro: No focal deficits HEENT: WNL Cardiovascular: Reg, no  M  Lungs: Coarse bilaterally. Abdomen: BS x 4, soft, NT/ND.  Musculoskeletal: Chronic stasis changes, 1+ LE pitting edema.    LABS: I have reviewed all of today's lab results. Relevant abnormalities are discussed in the A/P section  CXR: 2/12 [place] edema pattern with effusions  ASSESSMENT / PLAN:  PULMONARY OETT  2/9 >> 2/12 A: Acute respiratory failure in post operative setting PAH - Peak pressure 73 on echo 2014 Pulmonary edema  P:   Cont diuresis as permitted by BP and renal function Supp O2  DNI/DNR  - confirmed with daughters 2/12 If unable to be comfortable with the above measures, initiate PRN morphine for dyspnea  CARDIOVASCULAR A:  Chronic AF (no chronic anticoagulation due to prior GIB) - rate controlled Elevated troponin - suspect demand ischemia Post op hypotension  Acute on Chronic Diastolic CHF Prolonged QTc H/o HTN P:  Cont telemetry monitoring Cardiology following Holding home antihypertensives  RENAL A:   AKI, nonoliguric CKD Lactic acidosis, resolved Lasix dependent at home P:   Monitor BMET intermittently Monitor I/Os Correct electrolytes as indicated Lasix on hold - resume once renal fn improves  GASTROINTESTINAL A:   Post extubation dysphagia  P:   SUP: N/I post extubation NPO for now. Advance diet as tolerated   HEMATOLOGIC A:   Anemia, chronic Acute blood loss anemia (peri-op) - no active  bleeding presently Mild thrombocytopenia P:  DVT px: SQ heparin Monitor CBC intermittently Transfuse per usual ICU guidelines  INFECTIOUS A:  2/10 urine >>E coli  R to levaquin  c diff neg 2/12 P:   Start cefazolin  ENDOCRINE A:   No acute issues P:   Follow glucose on chemistry  NEUROLOGIC A:   Acute encephalopathy, resolved Post op pain P:   RASS goal 0 Low dose PRN morphine for pain   DS spoke to 2 daughters @ bedside. we all agree that he will not be re-intubated. He is full DNR/DNI and comfort measures are to be  undertaken if he develops an unacceptable level of discomfort  Transfer to floor & [facility] 2/14  Care during the described time interval was provided by me and/or other providers on the critical care team.  I have reviewed this patient's available data, including medical history, events of note, physical examination and test results as part of my evaluation  CC time x 42m  [name] MD. FCCP. [facility] Pager [phone] If no response call [phone]      12/13/2014 7:48 AM

## 2014-12-13 NOTE — Progress Notes (Signed)
Patient ID: RAIMUNDO CORBIT, male   DOB: 12/20/1920, 81 y.o.   MRN: [id number]   TELEMETRY: Reviewed telemetry pt in Atrial fibrillation with controlled rate 80-100   Filed Vitals:   12/13/14 0800 12/13/14 0900 12/13/14 1000 12/13/14 1100  BP: 109/57 103/50 92/48   Pulse: 107 100 112   Temp:    98.1 F (36.7 C)  TempSrc:    Axillary  Resp: 27 15 30    Height:      Weight:      SpO2: 100% 98% 98%     Intake/Output Summary (Last 24 hours) at 12/13/14 1136 Last data filed at 12/13/14 1000  Gross per 24 hour  Intake 178.57 ml  Output   2150 ml  Net -1971.43 ml   Filed Weights   12/11/14 0100 12/12/14 0153 12/13/14 0500  Weight: 91.9 kg (202 lb 9.6 oz) 90.1 kg (198 lb 10.2 oz) 90 kg (198 lb 6.6 oz)    Subjective Lethargic but conversant Pain control seems ok    . antiseptic oral rinse  7 mL Mouth Rinse q12n4p  . chlorhexidine  15 mL Mouth Rinse BID  . Chlorhexidine Gluconate Cloth  6 each Topical Q0600  . feeding supplement (PRO-STAT SUGAR FREE 64)  30 mL Per Tube Daily  . heparin  5,000 Units Subcutaneous 3 times per day  . levothyroxine  75 mcg Oral QAC breakfast  . mupirocin ointment  1 application Nasal BID  . sodium chloride  10-40 mL Intracatheter Q12H   . sodium chloride 10 mL/hr at 12/13/14 0400    LABS: Basic Metabolic Panel:  Recent Labs  12/12/14 0400 12/13/14 0400  NA 143 147*  K 3.4* 3.9  CL 111 110  CO2 22 27  GLUCOSE 137* 102*  BUN 83* 88*  CREATININE 4.29* 4.35*  CALCIUM 8.2* 8.4   Liver Function Tests:  Recent Labs  12/11/14 0400  AST 95*  ALT 79*  ALKPHOS 72  BILITOT 0.8  PROT 5.5*  ALBUMIN 2.1*   CBC:  Recent Labs  12/11/14 0400 12/12/14 0400  WBC 9.7 7.7  NEUTROABS 8.2*  --   HGB 8.4* 8.1*  HCT 26.1* 24.8*  MCV 87.3 85.5  PLT 127* 118*   Cardiac Enzymes: No results for input(s): CKTOTAL, CKMB, CKMBINDEX, TROPONINI in the last 72 hours.  Thyroid Function Tests: No results for input(s): TSH, T4TOTAL, T3FREE, THYROIDAB  in the last 72 hours.  Invalid input(s): FREET3   Radiology/Studies:  Dg Chest Port 1 View  12/12/2014   CLINICAL DATA:  Respiratory failure.  EXAM: PORTABLE CHEST - 1 VIEW  COMPARISON:  December 11, 2014.  FINDINGS: Stable cardiomegaly. Endotracheal and nasogastric tubes are in grossly good position and unchanged compared to prior exam. Status post coronary artery bypass graft. Left-sided PICC line is noted with distal tip overlying expected position of cavoatrial junction. Stable bibasilar opacities are noted consistent with pulmonary edema with associated pleural effusions. No pneumothorax is noted.  IMPRESSION: Stable support apparatus. Stable bibasilar opacities are noted consistent with pulmonary edema with associated pleural effusions.   Electronically Signed   By: Marijo Conception, M.D.   On: 12/12/2014 08:11   Dg Chest Port 1 View  12/11/2014   CLINICAL DATA:  Status post insertion of left-sided PICC line.  EXAM: PORTABLE CHEST - 1 VIEW  COMPARISON:  Same day.  FINDINGS: Stable cardiomegaly. Endotracheal nasogastric tubes are in grossly good position. Status post coronary artery bypass graft. Bilateral basilar densities are noted concerning for edema.  Bilateral pleural effusions are noted. No pneumothorax is noted. Interval placement of left-sided PICC line with distal tip overlying the expected position of the SVC.  IMPRESSION: Stable support apparatus. Interval placement of left-sided PICC line with distal tip overlying expected position of the SVC. Moderate bilateral pleural effusions with associated pulmonary edema are noted.   Electronically Signed   By: Marijo Conception, M.D.   On: 12/11/2014 13:19    2D ECHO Study Date: 12/10/2014 LV EF: 50% -  55% Study Conclusions - Left ventricle: The cavity size was normal. There was moderate concentric hypertrophy. Systolic function was normal. The estimated ejection fraction was in the range of 50% to 55%. Wall motion was normal; there  were no regional wall motion abnormalities. The study was not technically sufficient to allow evaluation of LV diastolic dysfunction due to atrial fibrillation. - Aortic valve: Trileaflet; severely thickened, severely calcified leaflets. Valve mobility was restricted. There was moderate stenosis. There was trivial regurgitation. Mean gradient (S): 18 mm Hg. Valve area (VTI): 0.74 cm^2. Valve area (Vmax): 0.72 cm^2. Valve area (Vmean): 0.7 cm^2. - Aortic root: The aortic root was normal in size. - Mitral valve: Calcified annulus. Moderately thickened, moderately calcified leaflets . Mobility was restricted. There was moderate regurgitation. Valve area by continuity equation (using LVOT flow): 1.47 cm^2. - Left atrium: The atrium was severely dilated. - Right ventricle: The cavity size was moderately dilated. Wall thickness was normal. - Right atrium: The atrium was severely dilated. - Tricuspid valve: There was moderate-severe regurgitation. - Pulmonary arteries: Systolic pressure was moderately to severely increased. PA peak pressure: 55 mm Hg (S). Impressions: - There is no significant difference when compared to the study from 04/16/2013.   Ecg: 12/10/14: AFib with controlled rate. QTc 492 msec. No acute change.    PHYSICAL EXAM General: Elderly, chronically ill male, on vent but arousable. Head: Normocephalic, atraumatic, sclera non-icteric, oropharynx is clear Neck: Negative for carotid bruits. JVD is elevated. No adenopathy Lungs: Clear anteriorly on left. Some faint rhonchi on right. Heart: IRRR S1 S2 with grade 2/6 systolic murmur of AS. Abdomen: Soft, non-tender, non-distended with normoactive bowel sounds. No hepatomegaly. No rebound/guarding. No obvious abdominal masses. Extremities: tr-1+edema.  Distal pedal pulses are 2+ and equal bilaterally. Neuro: Sedated on vent but awake. Moves all extremities spontaneously.   ASSESSMENT AND PLAN: 1.  Closed left hip fracture. s/p left hemiarthroplasty.   2. Chronic diastolic CHF. BNP elevated. He has lower extremity edema. Urine output improved. I/O mildly positive.  -- 2D ECHO yesterday with EF 50-55%, no RWMA, mod AS, mod MR, severe LA dilation, mod-sev TR, PA pk pressure 55. -- CXR with stable pulmonary vascular congestion/interstitial edema with bilateral pleural effusions and bibasilar collapse/consolidation. -- I/O negative lasix held   3. Demand ischemia with elevated troponins. Flat troponin curve. Related to CHF, CKD, respiratory problems and recent stress of fracture. No chest pain. Ecg without acute ST changes.  4. Afib. Rate controlled.  Will monitor. Not a candidate for long term anticoagulation due to GIB. on [place] heparin q 8 post hip fracture   5. Acute on CKD. Creatinine increased 2.3>2.58>3.07> 3.96>4.29. Suspect  ATN with hypotension intraop. Nonoliguric   6. Anemia acute on chronic (anemia of critical illness)  7. HTN- now hypotensive. BP stable on phenylephrine.  8. Moderate aortic stenosis- repeat 2d ECHO Mean gradient (S): 10mm Hg. Valve area (VTI): 0.74 cm^2. Valve area (Vmax): 0.72 cm^2. Valve area (Vmean): 0.7 cm^2.  9. CAD s/p CABG  10. Acute respiratory failure. Vent management per CCM. On broad spectrum antibiotics.   Present on Admission:  . Atrial fibrillation . CKD (chronic kidney disease) stage 3, GFR 30-59 ml/min . Anemia . (Resolved) CAD (coronary artery disease) of artery bypass graft . HTN (hypertension) . Dyslipidemia . Closed fracture of left hip . Chronic diastolic heart failure, NYHA class 1 . Hip fracture requiring operative repair . Closed left hip fracture  Rozann Lesches, [place] 12/13/2014 11:36 AM

## 2014-12-13 NOTE — Evaluation (Signed)
Clinical/Bedside Swallow Evaluation Patient Details  Name: [name] [name] MRN: [id number] Date of Birth: 1920/12/21  Today's Date: 12/13/2014 Time: SLP Start Time (ACUTE ONLY): 1200 SLP Stop Time (ACUTE ONLY): 1240 SLP Time Calculation (min) (ACUTE ONLY): 40 min  Past Medical History:  Past Medical History  Diagnosis Date  . Hypertension   . History of prostate cancer 1996    treated with radiation  . Arthritis   . Gout, arthritis 2010    bil feet  . Cancer     PORSTATE-HX OF RADIATION AND SURGERY  . Diverticulosis of colon   . CAD (coronary artery disease)     cabg 1996; echo 03/15/2007-EF 45-50%, LA mod to severe dilated, mod TR, mod pulmonary HTN, mod sclerotic aortic valve; myoview6/23/2010- no ischemia  . A-fib     chronic, no longer on coumadin due to rectal bleeding  . Hyperlipidemia   . Rectal bleed   . CHF (congestive heart failure) 10/18/13   Past Surgical History:  Past Surgical History  Procedure Laterality Date  . Coronary artery bypass graft  04/1995    LIMA to LAD, vein graft to diag branch, vein to 3 sequential marginal branches, and vein to PDA  . Appendectomy  1950s  . Eye surgery  2009    cataract surgery  . Cryoablation of prostate  06/23/2005  . Colonoscopy  11/03/2000  . Cardiac catheterization  05/04/2005    patent grafts  . Cardiac catheterization  04/10/1995    presyncopal episode on way to OR, 3V disease with left main involvement, nl LV, CVTS was contacted  . Hip arthroplasty Left 12/09/2014    Procedure: LEFT HIP HEMIARTHROPLASTY;  Surgeon: Mcarthur Rossetti, MD;  Location: [facility];  Service: Orthopedics;  Laterality: Left;   HPI:  81 year old male admitted 12/08/14 after fall at home, sustaining hip fracture. Orders received to evaluate for po readiness   Assessment / Plan / Recommendation Clinical Impression  Pt noted to be quite weak, speech difficult to understand. Pt has dentures, but they are currently not in place. Oral care completed with  suction. Areas of irritation noted on palate. Volitional cough noted to be very weak. Reduced laryngeal elevation on palpation. Pt exhibited reduced oral manipulation of all consistencies, with concerning tendency for open mouth breathing with po trial still in his mouth. Change in breath sounds noted after thin liquid trial, with 1-2 minute delayed cough noted. At this time, pt is unsafe for po intake given lethargy, generalized weakness, reduced laryngeal elevation, poor oral  manipulation. Pt was intubated for >24 hours, increasing risk of dysphagia post-extubation. Recommend NPO status for now, including medications. 1-2 ice chips may be provided by RN following thorough oral care for oral moisture and to mitigate disuse atrophy. ST to recheck next date for improvement/readiness, as pt is currently not supported nutritionally.     Aspiration Risk  Moderate    Diet Recommendation NPO;Ice chips PRN after oral care   Medication Administration: Via alternative means    Other  Recommendations Oral Care Recommendations: Oral care Q4 per protocol Other Recommendations: Have oral suction available   Follow Up Recommendations   (TBD)    Frequency and Duration min 2x/week  1 week   Pertinent Vitals/Pain Pt reports no pain    SLP Swallow Goals  demonstrate po readiness   Swallow Study Prior Functional Status  Type of Home: House Available Help at Discharge: Personal care attendant (was receiving hospice care)    General Date of  Onset: 12/08/14 HPI: 81 year old male admitted 12/08/14 after fall at home, sustaining hip fracture. Orders received to evaluate for po readiness Type of Study: Bedside swallow evaluation Previous Swallow Assessment: none found Diet Prior to this Study: NPO Respiratory Status: Room air History of Recent Intubation: Yes Length of Intubations (days): 3 days Date extubated: 12/12/14 Behavior/Cognition: Cooperative;Lethargic;Requires cueing;Hard of hearing Oral  Cavity - Dentition: Edentulous Self-Feeding Abilities: Total assist Patient Positioning: Upright in bed Baseline Vocal Quality: Hoarse;Low vocal intensity Volitional Cough: Weak Volitional Swallow: Able to elicit    Oral/Motor/Sensory Function Overall Oral Motor/Sensory Function: Appears within functional limits for tasks assessed [facility] but weak)   Ice Chips Ice chips: Impaired Oral Phase Impairments: Reduced labial seal;Reduced lingual movement/coordination;Impaired anterior to posterior transit Oral Phase Functional Implications: Prolonged oral transit Pharyngeal Phase Impairments: Suspected delayed Swallow;Decreased hyoid-laryngeal movement   Thin Liquid Thin Liquid: Impaired Oral Phase Impairments: Impaired anterior to posterior transit Oral Phase Functional Implications: Prolonged oral transit Pharyngeal  Phase Impairments: Cough - Delayed;Suspected delayed Swallow;Decreased hyoid-laryngeal movement    Nectar Thick Nectar Thick Liquid: Not tested   Honey Thick Honey Thick Liquid: Not tested   Puree Puree: Impaired Oral Phase Impairments: Reduced lingual movement/coordination;Impaired anterior to posterior transit Oral Phase Functional Implications: Prolonged oral transit;Oral residue Pharyngeal Phase Impairments: Cough - Delayed;Suspected delayed Swallow;Decreased hyoid-laryngeal movement   Solid   GO    Solid: Not tested      Bud Kaeser B. Quentin Ore [facility], [place] [phone]  Shonna Chock 12/13/2014,1:36 PM

## 2014-12-13 NOTE — Progress Notes (Signed)
Pt stable -  Pain with rom hip left Leg lengths ok

## 2014-12-13 NOTE — Progress Notes (Signed)
ANTIBIOTIC CONSULT NOTE  81 yo M admitted on 12/08/2014 with closed L hip fracture after a fall. Subsequently was found to have a dirty UA so urine was cultured and treatment with cipro was initiated. Urine culture has now resulted in Ecoli resistant to fluoroquinolones. With the patient's history of anaphylaxis to cefazolin and potentially sulfa antibiotics, options are extremely limited. Since the patient does not seem to be having any symptoms related to UTI, will hold off on antibiotics for the time being and reassess as indicated.   Ruta Hinds. Velva Harman, PharmD, [facility] Clinical Pharmacist - Resident Pager: [phone] Pharmacy: [phone] 12/13/2014 9:03 AM

## 2014-12-13 NOTE — Progress Notes (Deleted)
81yo male sustained hip fx s/p mechanical fall, required post-op vent for respiratory failure, w/ non-oliguric AKI, to begin IV ABX for UTI.  Will start Ancef 500mg  IV Q12H and monitor CBC, Cx, and SCr (will require higher dose if renal fxn improves).  Wynona Neat, PharmD, BCPS 12/13/2014 8:05 AM

## 2014-12-14 ENCOUNTER — Inpatient Hospital Stay (HOSPITAL_COMMUNITY): Payer: Medicare Other

## 2014-12-14 DIAGNOSIS — J9621 Acute and chronic respiratory failure with hypoxia: Secondary | ICD-10-CM | POA: Insufficient documentation

## 2014-12-14 DIAGNOSIS — E038 Other specified hypothyroidism: Secondary | ICD-10-CM | POA: Insufficient documentation

## 2014-12-14 DIAGNOSIS — E785 Hyperlipidemia, unspecified: Secondary | ICD-10-CM

## 2014-12-14 DIAGNOSIS — I10 Essential (primary) hypertension: Secondary | ICD-10-CM | POA: Insufficient documentation

## 2014-12-14 DIAGNOSIS — N179 Acute kidney failure, unspecified: Secondary | ICD-10-CM | POA: Insufficient documentation

## 2014-12-14 DIAGNOSIS — I4891 Unspecified atrial fibrillation: Secondary | ICD-10-CM

## 2014-12-14 DIAGNOSIS — N189 Chronic kidney disease, unspecified: Secondary | ICD-10-CM

## 2014-12-14 DIAGNOSIS — I2510 Atherosclerotic heart disease of native coronary artery without angina pectoris: Secondary | ICD-10-CM

## 2014-12-14 LAB — CBC WITH DIFFERENTIAL/PLATELET
Basophils Absolute: 0 10*3/uL (ref 0.0–0.1)
Basophils Absolute: 0 10*3/uL (ref 0.0–0.1)
Basophils Relative: 0 % (ref 0–1)
Basophils Relative: 0 % (ref 0–1)
EOS ABS: 0 10*3/uL (ref 0.0–0.7)
Eosinophils Absolute: 0.1 10*3/uL (ref 0.0–0.7)
Eosinophils Relative: 0 % (ref 0–5)
Eosinophils Relative: 1 % (ref 0–5)
HCT: 23.9 % — ABNORMAL LOW (ref 39.0–52.0)
HCT: 25.3 % — ABNORMAL LOW (ref 39.0–52.0)
HEMOGLOBIN: 7.9 g/dL — AB (ref 13.0–17.0)
Hemoglobin: 7.5 g/dL — ABNORMAL LOW (ref 13.0–17.0)
LYMPHS PCT: 7 % — AB (ref 12–46)
LYMPHS PCT: 11 % — AB (ref 12–46)
Lymphs Abs: 0.8 10*3/uL (ref 0.7–4.0)
Lymphs Abs: 1 10*3/uL (ref 0.7–4.0)
MCH: 27.8 pg (ref 26.0–34.0)
MCH: 27.4 pg (ref 26.0–34.0)
MCHC: 31.4 g/dL (ref 30.0–36.0)
MCHC: 31.2 g/dL (ref 30.0–36.0)
MCV: 88.5 fL (ref 78.0–100.0)
MCV: 87.8 fL (ref 78.0–100.0)
MONOS PCT: 6 % (ref 3–12)
Monocytes Absolute: 0.6 10*3/uL (ref 0.1–1.0)
Monocytes Absolute: 0.5 10*3/uL (ref 0.1–1.0)
Monocytes Relative: 5 % (ref 3–12)
NEUTROS ABS: 8.8 10*3/uL — AB (ref 1.7–7.7)
Neutro Abs: 8.2 10*3/uL — ABNORMAL HIGH (ref 1.7–7.7)
Neutrophils Relative %: 87 % — ABNORMAL HIGH (ref 43–77)
Neutrophils Relative %: 83 % — ABNORMAL HIGH (ref 43–77)
PLATELETS: 127 10*3/uL — AB (ref 150–400)
Platelets: 141 10*3/uL — ABNORMAL LOW (ref 150–400)
RBC: 2.7 MIL/uL — ABNORMAL LOW (ref 4.22–5.81)
RBC: 2.88 MIL/uL — AB (ref 4.22–5.81)
RDW: 18.3 % — AB (ref 11.5–15.5)
RDW: 18.1 % — AB (ref 11.5–15.5)
WBC: 10.2 10*3/uL (ref 4.0–10.5)
WBC: 9.9 10*3/uL (ref 4.0–10.5)

## 2014-12-14 LAB — BASIC METABOLIC PANEL
Anion gap: 13 (ref 5–15)
BUN: 94 mg/dL — AB (ref 6–23)
CO2: 25 mmol/L (ref 19–32)
Calcium: 8.4 mg/dL (ref 8.4–10.5)
Chloride: 111 mmol/L (ref 96–112)
Creatinine, Ser: 4.52 mg/dL — ABNORMAL HIGH (ref 0.50–1.35)
GFR calc Af Amer: 12 mL/min — ABNORMAL LOW (ref >90)
GFR calc non Af Amer: 10 mL/min — ABNORMAL LOW (ref >90)
Glucose, Bld: 79 mg/dL (ref 70–99)
POTASSIUM: 4 mmol/L (ref 3.5–5.1)
Sodium: 149 mmol/L — ABNORMAL HIGH (ref 135–145)

## 2014-12-14 LAB — BLOOD GAS, ARTERIAL
Acid-base deficit: 0.1 mmol/L (ref 0.0–2.0)
BICARBONATE: 24.7 meq/L — AB (ref 20.0–24.0)
DRAWN BY: 277331
O2 Content: 3 L/min
O2 Saturation: 94.1 %
PATIENT TEMPERATURE: 99.1
TCO2: 26.1 mmol/L (ref 0–100)
pCO2 arterial: 46.1 mmHg — ABNORMAL HIGH (ref 35.0–45.0)
pH, Arterial: 7.35 (ref 7.350–7.450)
pO2, Arterial: 79.1 mmHg — ABNORMAL LOW (ref 80.0–100.0)

## 2014-12-14 LAB — MAGNESIUM: MAGNESIUM: 1.8 mg/dL (ref 1.5–2.5)

## 2014-12-14 LAB — ALBUMIN: Albumin: 2.1 g/dL — ABNORMAL LOW (ref 3.5–5.2)

## 2014-12-14 LAB — PREPARE RBC (CROSSMATCH)

## 2014-12-14 MED ORDER — FREE WATER
100.0000 mL | Status: DC
  Administered 2014-12-14 – 2014-12-15 (×5): 100 mL

## 2014-12-14 MED ORDER — FUROSEMIDE 10 MG/ML IJ SOLN
20.0000 mg | Freq: Once | INTRAMUSCULAR | Status: AC
  Administered 2014-12-15: 20 mg via INTRAVENOUS
  Filled 2014-12-14: qty 2

## 2014-12-14 MED ORDER — LORAZEPAM 2 MG/ML IJ SOLN: 0.5000 mg | Freq: Once | INTRAMUSCULAR | Status: DC

## 2014-12-14 MED ORDER — METOPROLOL TARTRATE 1 MG/ML IV SOLN: 2.5000 mg | INTRAVENOUS | Status: DC | PRN

## 2014-12-14 MED ORDER — SODIUM CHLORIDE 0.9 % IV SOLN
Freq: Once | INTRAVENOUS | Status: AC
  Administered 2014-12-14: 16:00:00 via INTRAVENOUS

## 2014-12-14 MED ORDER — FUROSEMIDE 10 MG/ML IJ SOLN: 60.0000 mg | Freq: Two times a day (BID) | INTRAMUSCULAR | Status: DC

## 2014-12-14 MED ORDER — FUROSEMIDE 10 MG/ML IJ SOLN
60.0000 mg | Freq: Once | INTRAMUSCULAR | Status: AC
  Administered 2014-12-14: 60 mg via INTRAVENOUS
  Filled 2014-12-14: qty 6

## 2014-12-14 MED ORDER — DEXTROSE 5 % IV SOLN
INTRAVENOUS | Status: DC
  Administered 2014-12-14: 50 mL via INTRAVENOUS
  Administered 2014-12-16 – 2014-12-18 (×2): via INTRAVENOUS

## 2014-12-14 MED ORDER — DILTIAZEM HCL 100 MG IV SOLR
5.0000 mg/h | INTRAVENOUS | Status: DC
  Administered 2014-12-14 – 2014-12-16 (×3): 5 mg/h via INTRAVENOUS
  Filled 2014-12-14 (×3): qty 100

## 2014-12-14 NOTE — Progress Notes (Signed)
PULMONARY / CRITICAL CARE MEDICINE   Name: [name] [name] MRN: [id number] DOB: 20-Jul-1921    ADMISSION DATE:  12/08/2014 CONSULTATION DATE:  12/14/2014  Service provided 12/10/14  REFERRING MD :  [name] [name]  CHIEF COMPLAINT:  Left femoral fx s/p left hemiarthroplasty 2/9, back to ICU on vent  INITIAL PRESENTATION:  81 y.o. M who sustained left hip fx on 2/8 after mechanical fall at home.  Taken to OR 2/9 for left hip hemiarthroplasty and following OR, returned to ICU on vent.  PCCM consulted for vent management. Course complicated by AKI  STUDIES:  L Hip X Ray 2/8 >>> acute subcapital fx left hip. CXR 2/8 >>> mild interstitial edema with associated small b/l pleural effusions  SIGNIFICANT EVENTS: 2/8 - admitted with L hip fx 2/9 - left hip hemiarthroplasty [name] [name]).  Returned to OR on vent, PCCM consulted. 2/10 TTE: moderate concentric hypertrophy. LVEF 50-55% 2/12 Passed SBT. Extubated. Tolerated well initially   SUBJECTIVE:  Appears improved Afebrile deines pain  VITAL SIGNS: Temp:  [97.5 F (36.4 C)-99.1 F (37.3 C)] 97.5 F (36.4 C) (02/14 0400) Pulse Rate:  [46-121] 112 (02/14 0700) Resp:  [15-30] 18 (02/14 0700) BP: (88-119)/(39-58) 98/52 mmHg (02/14 0700) SpO2:  [92 %-100 %] 99 % (02/14 0700) Weight:  [85.7 kg (188 lb 15 oz)] 85.7 kg (188 lb 15 oz) (02/14 0500) HEMODYNAMICS:   VENTILATOR SETTINGS:   INTAKE / OUTPUT: Intake/Output      02/13 0701 - 02/14 0700 02/14 0701 - 02/15 0700   I.V. (mL/kg) 230 (2.7)    Total Intake(mL/kg) 230 (2.7)    Urine (mL/kg/hr) 950 (0.5)    Stool 20 (0)    Total Output 970     Net -740            PHYSICAL EXAMINATION: General: RASS 0, + F/C Neuro: No focal deficits HEENT: WNL Cardiovascular: Reg, no M  Lungs: Coarse bilaterally. Abdomen: BS x 4, soft, NT/ND.  Musculoskeletal: Chronic stasis changes, 1+ LE pitting edema.    LABS: I have reviewed all of today's lab results. Relevant abnormalities are discussed in the A/P  section  CXR: 2/12 [place] edema pattern with effusions  ASSESSMENT / PLAN:  PULMONARY OETT  2/9 >> 2/12 A: Acute respiratory failure in post operative setting PAH - Peak pressure 73 on echo 2014 Pulmonary edema  P:   Supp O2  DNI/DNR  - confirmed with daughters 2/12 If unable to be comfortable with the above measures, initiate PRN morphine for dyspnea  CARDIOVASCULAR A:  Chronic AF (no chronic anticoagulation due to prior GIB) - rate controlled Elevated troponin - suspect demand ischemia Post op hypotension  Acute on Chronic Diastolic CHF Prolonged QTc H/o HTN P:  Cont telemetry monitoring Cardiology following Lopressor IV prn  RENAL A:   AKI, nonoliguric CKD Lactic acidosis, resolved Lasix dependent at home P:   Monitor BMET intermittently Monitor I/Os Correct electrolytes as indicated Lasix on hold - resume once renal fn improves  GASTROINTESTINAL A:   Post extubation dysphagia  P:   SUP: N/I post extubation NPO for now. Advance diet per swallow eval  HEMATOLOGIC A:   Anemia, chronic Acute blood loss anemia (peri-op) - no active bleeding presently Mild thrombocytopenia P:  DVT px: SQ heparin Monitor CBC intermittently Transfuse per usual ICU guidelines  INFECTIOUS A:  2/10 urine >>E coli  R to levaquin  c diff neg 2/12 P:   Allergic to PCN & sulfa, need ID input here  ENDOCRINE A:  No acute issues P:   Follow glucose on chemistry  NEUROLOGIC A:   Acute encephalopathy, resolved Post op pain P:   RASS goal 0 Low dose PRN morphine for pain   DS spoke to 2 daughters @ bedside. we all agree that he will not be re-intubated. He is full DNR/DNI and comfort measures are to be undertaken if he develops an unacceptable level of discomfort  Transfer to tele & [facility] 2/14   [name] Mead MD. [facility]. [facility] Pager [phone] If no response call [phone]      12/14/2014 8:26 AM

## 2014-12-14 NOTE — Progress Notes (Signed)
Patient transferred to 2C03 via bed. Patient admitted to telemetry. Patient alert to person only. NGT in place. No evidence of pain or discomfort at this time.

## 2014-12-14 NOTE — Progress Notes (Addendum)
[place] TEAM 1 - Stepdown/ICU TEAM Progress Note  [name] [name] YTK:[id number] DOB: 21-Oct-1921 DOA: 12/08/2014 PCP: [name], MD  Admit HPI / Brief Narrative: [name] [name] is a 81 y.o. male, who lives at home and has home health aides assist him. PMHx chronic respiratory failure on home O2,  moderate aortic stenosis with severe pulmonary hypertension and RV dilatation chronic diastolic heart failure, atrial fibrillation not on anticoagulation secondary to GI bleed, S/P CABG 1996 prostate cancer, S/P surgery + XRT, chronic [name] disease, After taking a bath today and while attempting to put on his clothes, he slipped and fell and was complaining of left hip pain. Upon presentation to the ER he was found to have an acute subcapital fracture of the left hip. Orthopedic surgery has been consulted by the EDP. Patient has a history of skipping diuretic dosages because of chronic urinary dribbling/incontinence. Recently he has developed progression of respiratory symptoms and is now requiring oxygen at home. His chest x-ray in the ER revealed mild interstitial edema and associated small bilateral pleural effusions. He is requiring oxygen in the emergency department.    HPI/Subjective:  A/O 0, patient will wake up briefly with significant stimulation but mumbles unintelligibly,   Assessment/Plan: Closed fracture of left hip  -Per surgery  Acute encephalopathy  -Most likely multifactorial to include electrolyte abnormalities, acute on chronic respiratory failure, acute on chronic renal failure poorly controlled atrial fib resulting in hypotension -Nothing by mouth until can be evaluated by speech therapy -Place NG tube; free water 100 ml q 4hr -Neuro check q 2hr  Hyponatremia -See acute encephalopathy  Acute on chronic hypoxemic respiratory failure -Multifactorial to include Moderate aortic stenosis,severe pulmonary hypertension, and /RV dilatation  Atrial fibrillation with RVR -Poorly  controlled rate; start Cardizem drip  -Continue Lopressor -Not on anticoagulation  Acute on Chronic diastolic heart failure, NYHA class 1 -Unkown Base Weight -Current heart exacerbation seems driven by patient noncompliance with diuretics at home -BNP 800 -Insert Foley catheter and begin Lasix 60 mg IV x1  -Continue supportive care with oxygen maintain SPO2>93% -Transfuse for hemoglobin<8 -2/14 Transfuse 2 units PRBC, Lasix 20 units to be administered between units -Transfer to step down unit  Pulmonary HTN -See Acute  On Chronic Diastolic CHF   HTN  -Patient currently hypotensive most likely secondary to poor cardiac function, poorly controlled atrial fibrillation, acute on chronic renal failure  -Gently diuresis; Lasix 60 mg 1 now, then Lasix 40 mg 1 between units of packed red blood cell  CAD/CABG 1996  Acute on Chronic Renal failure(Baseline ~2.03) -2/14 Patient's Cr creatinine has continued to trend up since admission. Consult nephrology if no improvement of renal function with Blood products and gentle diuresis -Strict in and out; since admission -2.5 L -Strict daily weight admission bed weight= 90.4 kg                      2/14 bed weight= 86.4 kg -2/14 Patient fluid overloaded  Lasix 60 mg x 1  -Would start scheduled gentle diuresis in the Am  History of prostatic problems and urinary incontinence -Check urinalysis and culture  Anemia -Baseline hemoglobin around 9.1 -See acute on chronic diastolic heart failure  Dyslipidemia -No medications prior to admission  Hypothyroidism -Continue Synthroid -2/8 TSH; within normal limit     Code Status: FULL Family Communication: Spoke with Ms. Cyndie Mull (Daughter); who stated continue doing everything. DNR to remain in place, will talk with sister this evening and  would like to meet with whoever is available tomorrow.  Disposition Plan: SNF? Hospice?    Consultants: Dr. Mcarthur Rossetti (orthopedic  surgery) Dr. Peter M Martinique (Cardiology) Dr.David B Simonds (PCCM)   Procedure/Significant Events: 2/10 echocardiogram;Left ventricle: moderate concentric hypertrophy. LVEF= 50% to 55%.  - Aortic valve: moderate stenosis.  - Mitral valve: moderate regurgitation.  - Left/Right atrium: severely dilated. - Right ventricle: moderately dilated.  -Tricuspid valve:moderate-severe regurgitation. - Pulmonary arteries:PA peak pressure: 55 mm Hg (S).   Culture NA  Antibiotics: None  DVT prophylaxis: SCD   Devices   LINES / TUBES:      Continuous Infusions: . sodium chloride 10 mL/hr at 12/13/14 0400    Objective: VITAL SIGNS: Temp: 97.5 F (36.4 C) (02/14 0400) Temp Source: Oral (02/14 0400) BP: 98/52 mmHg (02/14 0700) Pulse Rate: 112 (02/14 0700) SPO2; FIO2:   Intake/Output Summary (Last 24 hours) at 12/14/14 0805 Last data filed at 12/14/14 0600  Gross per 24 hour  Intake    220 ml  Output    800 ml  Net   -580 ml     Exam: General: A/O 0, patient will wake up briefly with significant stimulation but mumbles unintelligibly, moderate acute respiratory distress secondary to fluid overload Lungs: diffuse bilateral rhonchi, decreased bibasilar breath sounds, negative crackles Cardiovascular: Irregular irregular rhythm and rate, without murmur gallop or rub normal S1 and S2 Abdomen: Nontender, nondistended, soft, bowel sounds positive, no rebound, no ascites, no appreciable mass Extremities: No significant cyanosis, clubbing. Bilateral pedal edema 2-3+,    Data Reviewed: Basic Metabolic Panel:  Recent Labs Lab 12/10/14 0422 12/11/14 0400 12/12/14 0400 12/13/14 0400 12/14/14 0415  NA 143 143 143 147* 149*  K 4.2 3.7 3.4* 3.9 4.0  CL 112 111 111 110 111  CO2 23 25 22 27 25   GLUCOSE 121* 123* 137* 102* 79  BUN 52* 68* 83* 88* 94*  CREATININE 3.07* 3.96* 4.29* 4.35* 4.52*  CALCIUM 8.2* 7.8* 8.2* 8.4 8.4   Liver Function Tests:  Recent Labs Lab  12/08/14 1211 12/09/14 0654 12/11/14 0400  AST 26 37 95*  ALT 23 25 79*  ALKPHOS 98 90 72  BILITOT 0.9 1.7* 0.8  PROT 7.0 7.0 5.5*  ALBUMIN 2.9* 2.6* 2.1*   No results for input(s): LIPASE, AMYLASE in the last 168 hours. No results for input(s): AMMONIA in the last 168 hours. CBC:  Recent Labs Lab 12/08/14 1211 12/09/14 0654 12/10/14 0422 12/11/14 0400 12/12/14 0400  WBC 8.6 8.2 10.3 9.7 7.7  NEUTROABS 7.6  --   --  8.2*  --   HGB 8.7* 9.7* 8.9* 8.4* 8.1*  HCT 28.1* 31.0* 27.8* 26.1* 24.8*  MCV 89.8 89.1 88.5 87.3 85.5  PLT 157 144* 120* 127* 118*   Cardiac Enzymes:  Recent Labs Lab 12/09/14 1810 12/09/14 2146 12/10/14 0144 12/10/14 0422 12/10/14 0644  TROPONINI 1.35* 1.41* 1.44* 1.42* 1.37*   BNP (last 3 results)  Recent Labs  12/08/14 1211  BNP 800.1*    ProBNP (last 3 results) No results for input(s): PROBNP in the last 8760 hours.  CBG:  Recent Labs Lab 12/11/14 0333 12/11/14 0755 12/11/14 1127 12/11/14 1549 12/11/14 1914  GLUCAP 110* 114* 136* 136* 123*    Recent Results (from the past 240 hour(s))  Urine culture     Status: None   Collection Time: 12/09/14  1:10 AM  Result Value Ref Range Status   Specimen Description URINE, RANDOM  Final   Special Requests NONE  Final   Colony Count   Final    >=100,000 COLONIES/ML Performed at [facility]    Culture   Final    ESCHERICHIA COLI Performed at [facility]    Report Status 12/11/2014 FINAL  Final   Organism ID, Bacteria ESCHERICHIA COLI  Final      Susceptibility   Escherichia coli - MIC*    AMPICILLIN 4 SENSITIVE Sensitive     CEFAZOLIN <=4 SENSITIVE Sensitive     CEFTRIAXONE <=1 SENSITIVE Sensitive     CIPROFLOXACIN >=4 RESISTANT Resistant     GENTAMICIN <=1 SENSITIVE Sensitive     LEVOFLOXACIN >=8 RESISTANT Resistant     NITROFURANTOIN <=16 SENSITIVE Sensitive     TOBRAMYCIN <=1 SENSITIVE Sensitive     TRIMETH/SULFA <=20 SENSITIVE Sensitive     PIP/TAZO  <=4 SENSITIVE Sensitive     * ESCHERICHIA COLI  MRSA PCR Screening     Status: Abnormal   Collection Time: 12/09/14 11:42 PM  Result Value Ref Range Status   MRSA by PCR POSITIVE (A) NEGATIVE Final    Comment:        The GeneXpert MRSA Assay (FDA approved for NASAL specimens only), is one component of a comprehensive MRSA colonization surveillance program. It is not intended to diagnose MRSA infection nor to guide or monitor treatment for MRSA infections. RESULT CALLED TO, READ BACK BY AND VERIFIED WITH: CALLED TO RN Emerson Monte [id number] @[id number]  THANEY   Urine culture     Status: None   Collection Time: 12/10/14 12:55 PM  Result Value Ref Range Status   Specimen Description URINE, CATHETERIZED  Final   Special Requests NONE  Final   Colony Count   Final    >=100,000 COLONIES/ML Performed at [facility]    Culture   Final    ESCHERICHIA COLI Performed at [facility]    Report Status 12/12/2014 FINAL  Final   Organism ID, Bacteria ESCHERICHIA COLI  Final      Susceptibility   Escherichia coli - MIC*    AMPICILLIN 4 SENSITIVE Sensitive     CEFAZOLIN <=4 SENSITIVE Sensitive     CEFTRIAXONE <=1 SENSITIVE Sensitive     CIPROFLOXACIN >=4 RESISTANT Resistant     GENTAMICIN <=1 SENSITIVE Sensitive     LEVOFLOXACIN >=8 RESISTANT Resistant     NITROFURANTOIN <=16 SENSITIVE Sensitive     TOBRAMYCIN <=1 SENSITIVE Sensitive     TRIMETH/SULFA <=20 SENSITIVE Sensitive     PIP/TAZO <=4 SENSITIVE Sensitive     * ESCHERICHIA COLI  Clostridium Difficile by PCR     Status: None   Collection Time: 12/12/14  4:14 PM  Result Value Ref Range Status   C difficile by pcr NEGATIVE NEGATIVE Final     Studies:  Recent x-ray studies have been reviewed in detail by the Attending Physician  Scheduled Meds:  Scheduled Meds: . antiseptic oral rinse  7 mL Mouth Rinse q12n4p  . chlorhexidine  15 mL Mouth Rinse BID  . heparin  5,000 Units Subcutaneous 3 times per day  .  levothyroxine  75 mcg Oral QAC breakfast  . mupirocin ointment  1 application Nasal BID  . sodium chloride  10-40 mL Intracatheter Q12H    Time spent on care of this patient: 40 mins   Allie Bossier [facility]  [facility] Office  [phone] Pager - [phone]  On-Call/Text Page:      [email or web address]      password [email or web address]  If 7PM-7AM,  please contact night-coverage [email or web address] Password [email or web address] 12/14/2014, 8:05 AM   LOS: 6 days   Care during the described time interval was provided by me .  I have reviewed this patient's available data, including medical history, events of note, physical examination, radiology studies and test results as part of my evaluation  Dia Crawford, MD [phone] Pager

## 2014-12-14 NOTE — Progress Notes (Signed)
Inserted NG tube, auscultated air in stomach by two RNs.

## 2014-12-14 NOTE — Progress Notes (Signed)
Patient ID: [name] [name], male   DOB: [birth year], 81 y.o.   MRN: [id number]    81 year old male with a history of CAD, s/p CABG in 1996. His last cath was in 2006 and his grafts at that time were patent. Last nuc stress 03/2009 with low risk scan. He also has chronic atrial fibrillation, had been on Coumadin for [facility] but stopped 2014 due to GI bleed. HTN and HLD, as well as CKD 3. No recent cardiology follow up last note 2014. Previously has been DNR in 2014. Today while getting dressed he fell on his lt hip and in ER he was found to have an acute subcapital fracture of the left hip.    He is very lethargic today.   TELEMETRY: Reviewed telemetry pt in Atrial fibrillation with controlled rate 80-100   Filed Vitals:   12/14/14 0900 12/14/14 1000 12/14/14 1018 12/14/14 1024  BP: 102/53 118/57  101/55  Pulse: 107 104  107  Temp:    98.7 F (37.1 C)  TempSrc:    Oral  Resp: 21 25  22   Height:      Weight:   190 lb 7.6 oz (86.4 kg)   SpO2: 94% 100%  92%    Intake/Output Summary (Last 24 hours) at 12/14/14 1133 Last data filed at 12/14/14 0900  Gross per 24 hour  Intake    220 ml  Output    980 ml  Net   -760 ml   Filed Weights   12/13/14 0500 12/14/14 0500 12/14/14 1018  Weight: 198 lb 6.6 oz (90 kg) 188 lb 15 oz (85.7 kg) 190 lb 7.6 oz (86.4 kg)    Subjective Lethargic but conversant Pain control seems ok    . antiseptic oral rinse  7 mL Mouth Rinse q12n4p  . chlorhexidine  15 mL Mouth Rinse BID  . heparin  5,000 Units Subcutaneous 3 times per day  . levothyroxine  75 mcg Oral QAC breakfast  . mupirocin ointment  1 application Nasal BID  . sodium chloride  10-40 mL Intracatheter Q12H   . sodium chloride 10 mL/hr at 12/13/14 0400  . dextrose 50 mL (12/14/14 0930)    LABS: Basic Metabolic Panel:  Recent Labs  12/13/14 0400 12/14/14 0415 12/14/14 0910  NA 147* 149*  --   K 3.9 4.0  --   CL 110 111  --   CO2 27 25  --   GLUCOSE 102* 79  --   BUN 88* 94*  --     CREATININE 4.35* 4.52*  --   CALCIUM 8.4 8.4  --   MG  --   --  1.8   Liver Function Tests: No results for input(s): AST, ALT, ALKPHOS, BILITOT, PROT, ALBUMIN in the last 72 hours. CBC:  Recent Labs  12/12/14 0400 12/14/14 0910  WBC 7.7 10.2  NEUTROABS  --  8.8*  HGB 8.1* 7.5*  HCT 24.8* 23.9*  MCV 85.5 88.5  PLT 118* 127*   Cardiac Enzymes: No results for input(s): CKTOTAL, CKMB, CKMBINDEX, TROPONINI in the last 72 hours.  Thyroid Function Tests: No results for input(s): TSH, T4TOTAL, T3FREE, THYROIDAB in the last 72 hours.  Invalid input(s): FREET3   Radiology/Studies:  No results found.  2D ECHO Study Date: 12/10/2014 LV EF: 50% -  55% Study Conclusions - Left ventricle: The cavity size was normal. There was moderate concentric hypertrophy. Systolic function was normal. The estimated ejection fraction was in the range of 50% to  55%. Wall motion was normal; there were no regional wall motion abnormalities. The study was not technically sufficient to allow evaluation of LV diastolic dysfunction due to atrial fibrillation. - Aortic valve: Trileaflet; severely thickened, severely calcified leaflets. Valve mobility was restricted. There was moderate stenosis. There was trivial regurgitation. Mean gradient (S): 18 mm Hg. Valve area (VTI): 0.74 cm^2. Valve area (Vmax): 0.72 cm^2. Valve area (Vmean): 0.7 cm^2. - Aortic root: The aortic root was normal in size. - Mitral valve: Calcified annulus. Moderately thickened, moderately calcified leaflets . Mobility was restricted. There was moderate regurgitation. Valve area by continuity equation (using LVOT flow): 1.47 cm^2. - Left atrium: The atrium was severely dilated. - Right ventricle: The cavity size was moderately dilated. Wall thickness was normal. - Right atrium: The atrium was severely dilated. - Tricuspid valve: There was moderate-severe regurgitation. - Pulmonary arteries:  Systolic pressure was moderately to severely increased. PA peak pressure: 55 mm Hg (S). Impressions: - There is no significant difference when compared to the study from 04/16/2013.   Ecg: 12/10/14: AFib with controlled rate. QTc 492 msec. No acute change.    PHYSICAL EXAM General: Elderly, chronically ill male, very lethargic today.  Head: Normocephalic, atraumatic, sclera non-icteric, oropharynx is clear Neck: Negative for carotid bruits. JVD is elevated. No adenopathy Lungs: bilateral rhonchi, thick secretions  Heart: IRRR S1 S2 with grade 2/6 systolic murmur of AS. Abdomen: Soft, non-tender, non-distended with normoactive bowel sounds. No hepatomegaly. No rebound/guarding. No obvious abdominal masses. Extremities: tr-1+edema.  Distal pedal pulses are 2+ and equal bilaterally. Neuro: very lethartic.    ASSESSMENT AND PLAN: 1. Closed left hip fracture. s/p left hemiarthroplasty.   2. Chronic diastolic CHF. BNP elevated. He has lower extremity edema. Urine output improved. I/O mildly positive.  -- 2D ECHO  Shows  EF 50-55%, no RWMA, mod AS, mod MR, severe LA dilation, mod-sev TR, PA pk pressure 55. -- CXR with stable pulmonary vascular congestion/interstitial edema with bilateral pleural effusions and bibasilar collapse/consolidation. -- I/O negative lasix held   3. Demand ischemia with elevated troponins. Flat troponin curve. Related to CHF, CKD, respiratory problems and recent stress of fracture. No chest pain. Ecg without acute ST changes.  4. Afib. Rate controlled.  Will monitor. Not a candidate for long term anticoagulation due to GIB. on [place] heparin q 8 post hip fracture   5. Acute on CKD. Creatinine increased 2.3>2.58>3.07> 3.96>4.29. Suspect  ATN with hypotension intraop. Nonoliguric   6. Anemia acute on chronic (anemia of critical illness) - Hb has dropped through this hospitalization.  Followed by Int.Med.   7. HTN- now hypotensive. BP stable on  phenylephrine.  8. Moderate aortic stenosis- repeat 2d ECHO Mean gradient (S): 26mm Hg. Valve area (VTI): 0.74 cm^2. Valve area (Vmax): 0.72 cm^2. Valve area (Vmean): 0.7 cm^2.  9. CAD s/p CABG  10. Acute respiratory failure. Vent management per CCM. On broad spectrum antibiotics.   He is a DNR. No further suggestions.    Thayer Headings, Brooke Bonito., MD, [facility] 12/14/2014, 11:42 AM [address]  [place] Pager [phone]

## 2014-12-14 NOTE — Progress Notes (Signed)
Subjective: 5 Days Post-Op Procedure(s) (LRB): LEFT HIP HEMIARTHROPLASTY (Left) Patient reports pain as mild.    Objective: Vital signs in last 24 hours: Temp:  [97.5 F (36.4 C)-99.1 F (37.3 C)] 97.7 F (36.5 C) (02/14 0700) Pulse Rate:  [46-121] 107 (02/14 0900) Resp:  [16-30] 21 (02/14 0900) BP: (88-119)/(39-63) 102/53 mmHg (02/14 0900) SpO2:  [92 %-100 %] 94 % (02/14 0900) Weight:  [85.7 kg (188 lb 15 oz)] 85.7 kg (188 lb 15 oz) (02/14 0500)  Intake/Output from previous day: 02/13 0701 - 02/14 0700 In: 240 [I.V.:240] Out: 970 [Urine:950; Stool:20] Intake/Output this shift: Total I/O In: 20 [I.V.:20] Out: 80 [Stool:80]   Recent Labs  12/12/14 0400  HGB 8.1*    Recent Labs  12/12/14 0400  WBC 7.7  RBC 2.90*  HCT 24.8*  PLT 118*    Recent Labs  12/13/14 0400 12/14/14 0415  NA 147* 149*  K 3.9 4.0  CL 110 111  CO2 27 25  BUN 88* 94*  CREATININE 4.35* 4.52*  GLUCOSE 102* 79  CALCIUM 8.4 8.4   No results for input(s): LABPT, INR in the last 72 hours.  No cellulitis present,dressing left hip dry,swelling in left thigh but supple  Assessment/Plan: 5 Days Post-Op Procedure(s) (LRB): LEFT HIP HEMIARTHROPLASTY (Left) Being transfered to third floor today-stable from ortho standpoint  [place], PETER W 12/14/2014, 9:24 AM

## 2014-12-14 NOTE — Progress Notes (Signed)
Speech Language Pathology Treatment: Dysphagia  Patient Details Name: WAYMON LASER MRN: [id number] DOB: [birth year] Today's Date: 12/14/2014 Time: 3568-6168 SLP Time Calculation (min) (ACUTE ONLY): 13 min  Assessment / Plan / Recommendation Clinical Impression  Patient presents with mild improvements in level of alertness and ability to accept po trials compared to initial evaluation 2/13 however continues to be at high aspiration risk. Despite max SLP verbal and tactile cueing, patient unable to maintain adequate level of alertness for safe po intake. Hyo-laryngeal elevation/excursion appearing decreased upon palpation, congested cough noted at baseline significantly increased with po trials suggestive of decreased airway protection. Educated daughter regarding results and recommendations. If patient's level of alertness continues to improve, will consider instrumental testing of swallow. Will f/u 2/15.    HPI HPI: 81 year old male admitted 12/08/14 after fall at home, sustaining hip fracture. Orders received to evaluate for po readiness   Pertinent Vitals Pain Assessment: No/denies pain  SLP Plan  Continue with current plan of care    Recommendations Diet recommendations: NPO Medication Administration: Via alternative means              Oral Care Recommendations:  (QID) Follow up Recommendations: Other (comment) (TBD) Plan: Continue with current plan of care    [place], CCC-SLP [phone]  [place] 12/14/2014, 1:31 PM

## 2014-12-15 LAB — COMPREHENSIVE METABOLIC PANEL
ALBUMIN: 1.9 g/dL — AB (ref 3.5–5.2)
ALT: 57 U/L — ABNORMAL HIGH (ref 0–53)
AST: 40 U/L — AB (ref 0–37)
Alkaline Phosphatase: 77 U/L (ref 39–117)
Anion gap: 9 (ref 5–15)
BUN: 101 mg/dL — AB (ref 6–23)
CHLORIDE: 112 mmol/L (ref 96–112)
CO2: 28 mmol/L (ref 19–32)
CREATININE: 4.42 mg/dL — AB (ref 0.50–1.35)
Calcium: 8.5 mg/dL (ref 8.4–10.5)
GFR calc Af Amer: 12 mL/min — ABNORMAL LOW (ref >90)
GFR calc non Af Amer: 11 mL/min — ABNORMAL LOW (ref >90)
Glucose, Bld: 111 mg/dL — ABNORMAL HIGH (ref 70–99)
POTASSIUM: 4.1 mmol/L (ref 3.5–5.1)
SODIUM: 149 mmol/L — AB (ref 135–145)
Total Bilirubin: 1.7 mg/dL — ABNORMAL HIGH (ref 0.3–1.2)
Total Protein: 6.3 g/dL (ref 6.0–8.3)

## 2014-12-15 LAB — LACTIC ACID, PLASMA: Lactic Acid, Venous: 1 mmol/L (ref 0.5–2.0)

## 2014-12-15 LAB — MAGNESIUM: Magnesium: 1.9 mg/dL (ref 1.5–2.5)

## 2014-12-15 MED ORDER — FUROSEMIDE 10 MG/ML IJ SOLN
80.0000 mg | Freq: Two times a day (BID) | INTRAMUSCULAR | Status: AC
  Administered 2014-12-15: 80 mg via INTRAVENOUS
  Filled 2014-12-15: qty 8

## 2014-12-15 MED ORDER — FUROSEMIDE 10 MG/ML IJ SOLN: 80.0000 mg | Freq: Two times a day (BID) | INTRAMUSCULAR | Status: DC

## 2014-12-15 MED ORDER — SODIUM CHLORIDE 0.45 % IV SOLN: INTRAVENOUS | Status: DC

## 2014-12-15 NOTE — Progress Notes (Signed)
Speech Language Pathology Treatment: Dysphagia  Patient Details Name: [name] [name] MRN: [id number] DOB: 07-09-1921 Today's Date: 12/15/2014 Time: 1050-1100 SLP Time Calculation (min) (ACUTE ONLY): 10 min  Assessment / Plan / Recommendation Clinical Impression  Pt able to sustain arousal and attention throughout trials, however persistent sign of aspiration observed. Respiration affective safe swallowing. Recommend FEES to objectively evaluate swallow function. Discussed with pts daughter who will get back to me to confirm consent.    HPI HPI: 81 year old male admitted 12/08/14 after fall at home, sustaining hip fracture. Orders received to evaluate for po readiness   Pertinent Vitals Pain Assessment: No/denies pain  SLP Plan  Other (Comment) (FEES)    Recommendations Diet recommendations: NPO Medication Administration: Via alternative means              Plan: Other (Comment) (FEES)    GO    Supervised and reviewed by Herbie [place] MA CCC-SLP  [name] [name], [name] [name] 12/15/2014, 11:14 AM

## 2014-12-15 NOTE — Progress Notes (Signed)
[place] TEAM 1 - Stepdown/ICU TEAM Progress Note  CORLEY MAFFEO BTD:[id number] DOB: 08-08-1921 DOA: 12/08/2014 PCP: [name], MD  Admit HPI / Brief Narrative: 81 y.o. M who lives at home and has home health aides assist him. Hx chronic respiratory failure on home O2,  moderate aortic stenosis with severe pulmonary hypertension and RV dilatation, chronic diastolic heart failure, atrial fibrillation not on anticoagulation secondary to GI bleed, S/P CABG 1996, prostate cancer S/P surgery + XRT, and chronic kidney disease who was taking a bath and while attempting to put on his clothes, slipped and fell and developed left hip pain.   Upon presentation to the ER he was found to have an acute subcapital fracture of the left hip. His chest x-ray revealed mild interstitial edema and associated small bilateral pleural effusions.   HPI/Subjective: The pt was seen by PCCM today, who attended to all active medical issues.  I therefore did not examine the pt today.  He remains on the [facility] service, and PCCM has now signed off.    Assessment/Plan:  Closed fracture of left hip  -s/p L hip hemiarthroplasty - Ortho following   Acute encephalopathy  -Most likely multifactorial to include hypernatremia, acute on chronic respiratory failure, acute on chronic renal failure w/ severe uremia, poorly controlled atrial fib, and hypotension -cleared by SLP for Pureed diet w/ nectar thick liquids  Hypernatremia  -total body volume overloaded - follow w/ diuresis   Acute on chronic hypoxemic respiratory failure -Multifactorial to include Moderate aortic stenosis,severe pulmonary hypertension, and RV dilatation  Atrial fibrillation with RVR -Poorly controlled rate; start Cardizem drip - Cards following -Continue Lopressor -Not on anticoagulation  Acute on Chronic diastolic heart failure, NYHA class 1 -Unkown Base Weight -Continue supportive care with oxygen maintain SPO2>93% -Transfuse for hemoglobin<8 -  2/14 Transfused 2 units PRBC, Lasix 20 units to be administered between units -diuresis per Cardiology   Pulmonary HTN -See Acute  On Chronic Diastolic CHF   HTN  -Patient currently hypotensive most likely secondary to poor cardiac function, poorly controlled atrial fibrillation, acute on chronic renal failure  -Gently diuresis  CAD/CABG 1996  Acute on Chronic Renal failure(Baseline ~2.03) -2/14 Patient's Cr creatinine has continued to trend up since admission. Consult nephrology if no improvement of renal function with Blood products and gentle diuresis -Strict in and out; since admission -2 L -Strict daily weight admission bed weight= 90.4 kg > 2/14 bed weight= 86.4 kg  History of prostatic problems and urinary incontinence  Anemia -Baseline hemoglobin around 9.1 -See acute on chronic diastolic heart failure  Dyslipidemia -No medications prior to admission  Hypothyroidism -Continue Synthroid -2/8 TSH within normal limit  Code Status: FULL Family Communication:   Disposition Plan:   Consultants: Dr. Mcarthur Rossetti (Orthopedic surgery) Dr. Peter M Martinique (Cardiology) Dr.David B Simonds (PCCM)  Procedure/Significant Events: 2/10 echocardiogram;Left ventricle: moderate concentric hypertrophy. LVEF= 50% to 55%.  - Aortic valve: moderate stenosis.  - Mitral valve: moderate regurgitation.  - Left/Right atrium: severely dilated. - Right ventricle: moderately dilated.  -Tricuspid valve:moderate-severe regurgitation. - Pulmonary arteries:PA peak pressure: 55 mm Hg (S).  Antibiotics: None  DVT prophylaxis: SCD  Objective: Blood pressure 95/49, pulse 94, temperature 97.7 F (36.5 C), temperature source Oral, resp. rate 26, height 5\' 5"  (1.651 m), weight 90.3 kg (199 lb 1.2 oz), SpO2 98 %.  Intake/Output Summary (Last 24 hours) at 12/15/14 1457 Last data filed at 12/15/14 1400  Gross per 24 hour  Intake 2047.95 ml  Output   1550 ml  Net 497.95 ml    Exam: Pt not examined today by [facility]  Data Reviewed: Basic Metabolic Panel:  Recent Labs Lab 12/11/14 0400 12/12/14 0400 12/13/14 0400 12/14/14 0415 12/14/14 0910 12/15/14 0550  NA 143 143 147* 149*  --  149*  K 3.7 3.4* 3.9 4.0  --  4.1  CL 111 111 110 111  --  112  CO2 25 22 27 25   --  28  GLUCOSE 123* 137* 102* 79  --  111*  BUN 68* 83* 88* 94*  --  101*  CREATININE 3.96* 4.29* 4.35* 4.52*  --  4.42*  CALCIUM 7.8* 8.2* 8.4 8.4  --  8.5  MG  --   --   --   --  1.8 1.9   Liver Function Tests:  Recent Labs Lab 12/09/14 0654 12/11/14 0400 12/14/14 1930 12/15/14 0550  AST 37 95*  --  40*  ALT 25 79*  --  57*  ALKPHOS 90 72  --  77  BILITOT 1.7* 0.8  --  1.7*  PROT 7.0 5.5*  --  6.3  ALBUMIN 2.6* 2.1* 2.1* 1.9*   CBC:  Recent Labs Lab 12/10/14 0422 12/11/14 0400 12/12/14 0400 12/14/14 0910 12/14/14 1930  WBC 10.3 9.7 7.7 10.2 9.9  NEUTROABS  --  8.2*  --  8.8* 8.2*  HGB 8.9* 8.4* 8.1* 7.5* 7.9*  HCT 27.8* 26.1* 24.8* 23.9* 25.3*  MCV 88.5 87.3 85.5 88.5 87.8  PLT 120* 127* 118* 127* 141*   Cardiac Enzymes:  Recent Labs Lab 12/09/14 1810 12/09/14 2146 12/10/14 0144 12/10/14 0422 12/10/14 0644  TROPONINI 1.35* 1.41* 1.44* 1.42* 1.37*   BNP (last 3 results)  Recent Labs  12/08/14 1211  BNP 800.1*    CBG:  Recent Labs Lab 12/11/14 0333 12/11/14 0755 12/11/14 1127 12/11/14 1549 12/11/14 1914  GLUCAP 110* 114* 136* 136* 123*    Recent Results (from the past 240 hour(s))  Urine culture     Status: None   Collection Time: 12/09/14  1:10 AM  Result Value Ref Range Status   Specimen Description URINE, RANDOM  Final   Special Requests NONE  Final   Colony Count   Final    >=100,000 COLONIES/ML Performed at [facility]    Culture   Final    ESCHERICHIA COLI Performed at [facility]    Report Status 12/11/2014 FINAL  Final   Organism ID, Bacteria ESCHERICHIA COLI  Final      Susceptibility   Escherichia coli -  MIC*    AMPICILLIN 4 SENSITIVE Sensitive     CEFAZOLIN <=4 SENSITIVE Sensitive     CEFTRIAXONE <=1 SENSITIVE Sensitive     CIPROFLOXACIN >=4 RESISTANT Resistant     GENTAMICIN <=1 SENSITIVE Sensitive     LEVOFLOXACIN >=8 RESISTANT Resistant     NITROFURANTOIN <=16 SENSITIVE Sensitive     TOBRAMYCIN <=1 SENSITIVE Sensitive     TRIMETH/SULFA <=20 SENSITIVE Sensitive     PIP/TAZO <=4 SENSITIVE Sensitive     * ESCHERICHIA COLI  MRSA PCR Screening     Status: Abnormal   Collection Time: 12/09/14 11:42 PM  Result Value Ref Range Status   MRSA by PCR POSITIVE (A) NEGATIVE Final    Comment:        The GeneXpert MRSA Assay (FDA approved for NASAL specimens only), is one component of a comprehensive MRSA colonization surveillance program. It is not intended to diagnose MRSA infection nor to  guide or monitor treatment for MRSA infections. RESULT CALLED TO, READ BACK BY AND VERIFIED WITH: CALLED TO RN Emerson Monte [phone] @[id number]  THANEY   Urine culture     Status: None   Collection Time: 12/10/14 12:55 PM  Result Value Ref Range Status   Specimen Description URINE, CATHETERIZED  Final   Special Requests NONE  Final   Colony Count   Final    >=100,000 COLONIES/ML Performed at [facility]    Culture   Final    ESCHERICHIA COLI Performed at [facility]    Report Status 12/12/2014 FINAL  Final   Organism ID, Bacteria ESCHERICHIA COLI  Final      Susceptibility   Escherichia coli - MIC*    AMPICILLIN 4 SENSITIVE Sensitive     CEFAZOLIN <=4 SENSITIVE Sensitive     CEFTRIAXONE <=1 SENSITIVE Sensitive     CIPROFLOXACIN >=4 RESISTANT Resistant     GENTAMICIN <=1 SENSITIVE Sensitive     LEVOFLOXACIN >=8 RESISTANT Resistant     NITROFURANTOIN <=16 SENSITIVE Sensitive     TOBRAMYCIN <=1 SENSITIVE Sensitive     TRIMETH/SULFA <=20 SENSITIVE Sensitive     PIP/TAZO <=4 SENSITIVE Sensitive     * ESCHERICHIA COLI  Clostridium Difficile by PCR     Status: None    Collection Time: 12/12/14  4:14 PM  Result Value Ref Range Status   C difficile by pcr NEGATIVE NEGATIVE Final     Studies:  Recent x-ray studies have been reviewed in detail by the Attending Physician  Scheduled Meds:  Scheduled Meds: . antiseptic oral rinse  7 mL Mouth Rinse q12n4p  . chlorhexidine  15 mL Mouth Rinse BID  . free water  100 mL Per Tube Q4H  . furosemide  80 mg Intravenous Q12H  . heparin  5,000 Units Subcutaneous 3 times per day  . levothyroxine  75 mcg Oral QAC breakfast  . LORazepam  0.5 mg Intravenous Once  . sodium chloride  10-40 mL Intracatheter Q12H    Time spent on care of this patient: no charge  Cherene Altes, MD [facility] For Consults/Admissions - Flow Manager - [phone] Office  [phone]  Contact MD directly via text page:      [email or web address]      password [facility]  12/15/2014, 2:57 PM   LOS: 7 days

## 2014-12-15 NOTE — Progress Notes (Signed)
PULMONARY / CRITICAL CARE MEDICINE   Name: [name] [name] MRN: [id number] DOB: September 08, 1921    ADMISSION DATE:  12/08/2014 CONSULTATION DATE:  12/15/2014  Service provided 12/10/14  REFERRING MD :  [name]  CHIEF COMPLAINT:  Left femoral fx s/p left hemiarthroplasty 2/9, back to ICU on vent  INITIAL PRESENTATION:  81 y.o. M who sustained left hip fx on 2/8 after mechanical fall at home.  Taken to OR 2/9 for left hip hemiarthroplasty and following OR, returned to ICU on vent.  PCCM consulted for vent management. Course complicated by AKI  STUDIES:  L Hip X Ray 2/8 >>> acute subcapital fx left hip. CXR 2/8 >>> mild interstitial edema with associated small b/l pleural effusions  SIGNIFICANT EVENTS: 2/8 - admitted with L hip fx 2/9 - left hip hemiarthroplasty [name]).  Returned to OR on vent, PCCM consulted. 2/10 TTE: moderate concentric hypertrophy. LVEF 50-55% 2/12 Passed SBT. Extubated. Tolerated well initially   SUBJECTIVE:  Appears improved Afebrile deines pain  VITAL SIGNS: Temp:  [97.9 F (36.6 C)-99.1 F (37.3 C)] 98.4 F (36.9 C) (02/15 1001) Pulse Rate:  [86-106] 104 (02/15 1001) Resp:  [16-28] 21 (02/15 1001) BP: (97-191)/(41-88) 108/66 mmHg (02/15 1001) SpO2:  [97 %-100 %] 97 % (02/15 1001) Weight:  [90.3 kg (199 lb 1.2 oz)] 90.3 kg (199 lb 1.2 oz) (02/15 0428) HEMODYNAMICS:   VENTILATOR SETTINGS:   INTAKE / OUTPUT: Intake/Output      02/14 0701 - 02/15 0700 02/15 0701 - 02/16 0700   P.O. 0    I.V. (mL/kg) 768 (8.5) 110 (1.2)   Blood 670    NG/GT 200    Total Intake(mL/kg) 1638 (18.1) 110 (1.2)   Urine (mL/kg/hr) 1175 (0.5) 500 (1.5)   Stool 80 (0)    Total Output 1255 500   Net +383 -390          PHYSICAL EXAMINATION: General: RASS 0, + F/C Neuro: No focal deficits HEENT: WNL Cardiovascular: Reg, no M  Lungs: Coarse bilaterally. Abdomen: BS x 4, soft, NT/ND.  Musculoskeletal: Chronic stasis changes, 1+ LE pitting edema.    LABS: I have reviewed  all of today's lab results. Relevant abnormalities are discussed in the A/P section  CXR: 2/12 [place] edema pattern with effusions  ASSESSMENT / PLAN:  PULMONARY OETT  2/9 >> 2/12 A: Acute respiratory failure in post operative setting PAH - Peak pressure 73 on echo 2014 Pulmonary edema  P:   Supp O2  DNI/DNR  - confirmed with daughters 2/12 If unable to be comfortable with the above measures, initiate PRN morphine for dyspnea  CARDIOVASCULAR A:  Chronic AF (no chronic anticoagulation due to prior GIB) - rate controlled Elevated troponin - suspect demand ischemia Post op hypotension  Acute on Chronic Diastolic CHF Prolonged QTc H/o HTN P:  Cont telemetry monitoring Cardiology following Lopressor IV prn  RENAL A:   AKI, nonoliguric CKD Lactic acidosis, resolved Lasix dependent at home P:   Monitor BMET intermittently Monitor I/Os Correct electrolytes as indicated Lasix on hold - resume once renal fn improves  GASTROINTESTINAL A:   Post extubation dysphagia  P:   SUP: N/I post extubation NPO for now. Advance diet per swallow eval  HEMATOLOGIC A:   Anemia, chronic Acute blood loss anemia (peri-op) - no active bleeding presently Mild thrombocytopenia P:  DVT px: SQ heparin Monitor CBC intermittently Transfuse per usual ICU guidelines  INFECTIOUS A:  2/10 urine >>E coli  R to levaquin  c diff neg  2/12 P:   Allergic to PCN & sulfa, need ID input here  ENDOCRINE A:   No acute issues P:   Follow glucose on chemistry  NEUROLOGIC A:   Acute encephalopathy, resolved Post op pain P:   RASS goal 0 Low dose PRN morphine for pain  Appears comfortable, did not order labs, if continues to deteriorate then transition to comfort care, transferred to [facility], PCCM will sign off, please call back if needed.   12/15/2014 10:43 AM

## 2014-12-15 NOTE — Progress Notes (Signed)
Patient Name: [name] [name] Date of Encounter: 12/15/2014  Principal Problem:   Closed left hip fracture Active Problems:   CKD (chronic kidney disease) stage 3, GFR 30-59 ml/min   Anemia   HTN (hypertension)   Dyslipidemia   Moderate aortic stenosis   Closed fracture of left hip   Hip fracture requiring operative repair   CAD in native artery, s/p CABG 1996 with patent grafts 2006   Acute on chronic diastolic congestive heart failure   Pulmonary arterial hypertension   Demand ischemia   Preoperative cardiovascular examination   Acute respiratory failure   CKD (chronic kidney disease)   AKI (acute kidney injury)   Acute pulmonary edema   Atrial fibrillation with RVR   Acute on chronic respiratory failure with hypoxia   Acute on chronic renal failure   Essential hypertension   Other specified hypothyroidism   Primary Cardiologist: Dr. Gwenlyn Found   Patient Profile: 81 year old male with a history of CABG in 1996. Grafts patent by cath 2006, nuc stress 03/2009 was low risk. Permanent atrial fibrillation, on Coumadin for [facility] but stopped 2014 due to GI bleed.AS, HTN, HLD, CKD 3. Admitted 02/08 w/ hip fx, cards seeing for elevated trop, D-CHF. To OR 02/10, extubated 02/12.  SUBJECTIVE: Awake, pulling at mitts, responds to verbal but not oriented, not able to answer questions.  OBJECTIVE Filed Vitals:   12/15/14 0230 12/15/14 0330 12/15/14 0420 12/15/14 0428  BP: 110/75 117/88 117/66   Pulse: 97 90 95   Temp:   98.2 F (36.8 C)   TempSrc:   Axillary   Resp: 18 17 21    Height:      Weight:    199 lb 1.2 oz (90.3 kg)  SpO2: 100% 100% 100%     Intake/Output Summary (Last 24 hours) at 12/15/14 0746 Last data filed at 12/15/14 0600  Gross per 24 hour  Intake 1637.95 ml  Output   1255 ml  Net 382.95 ml   Filed Weights   12/14/14 0500 12/14/14 1018 12/15/14 0428  Weight: 188 lb 15 oz (85.7 kg) 190 lb 7.6 oz (86.4 kg) 199 lb 1.2 oz (90.3 kg)    PHYSICAL EXAM General:  Well developed, well nourished, male in no acute distress. Head: Normocephalic, atraumatic.  Neck: Supple without bruits, JVD elevated 10 cm. Lungs:  Resp regular and unlabored, rales bases. Heart: Irreg irreg, S1, S2, no S3, S4, 3/6 murmur; no rub. Abdomen: Soft, non-tender, non-distended, BS + x 4.  Extremities: No clubbing, cyanosis, no edema.  Neuro: Alert and oriented X 1. Moves all extremities spontaneously.  LABS: CBC:  Recent Labs  12/14/14 0910 12/14/14 1930  WBC 10.2 9.9  NEUTROABS 8.8* 8.2*  HGB 7.5* 7.9*  HCT 23.9* 25.3*  MCV 88.5 87.8  PLT 127* 637*   Basic Metabolic Panel:  Recent Labs  12/13/14 0400 12/14/14 0415 12/14/14 0910  NA 147* 149*  --   K 3.9 4.0  --   CL 110 111  --   CO2 27 25  --   GLUCOSE 102* 79  --   BUN 88* 94*  --   CREATININE 4.35* 4.52*  --   CALCIUM 8.4 8.4  --   MG  --   --  1.8   Liver Function Tests:  Recent Labs  12/14/14 1930  ALBUMIN 2.1*   Cardiac Enzymes: Lab Results  Component Value Date   TROPONINI 1.37* 12/10/2014   BNP: B NATRIURETIC PEPTIDE  Date Value  Ref Range Status  12/08/2014 800.1* 0.0 - 100.0 pg/mL Final   PRO B NATRIURETIC PEPTIDE (BNP)  Date/Time Value Ref Range Status  10/18/2013 01:50 PM 18021.0* 0 - 450 pg/mL Final  05/18/2013 06:07 AM 15727.0* 0 - 450 pg/mL Final   TELE:  Atrial fib, frequently RVR; 3 bt run NSVT      Radiology/Studies: Dg Chest Port 1 View 12/14/2014   CLINICAL DATA:  Shortness of Breath  EXAM: PORTABLE CHEST - 1 VIEW  COMPARISON:  December 12, 2014  FINDINGS: Endotracheal tube is no longer present. Central catheter tip is in the superior vena cava. Nasogastric tube is been removed. No pneumothorax.  There are bilateral pleural effusions. There is interstitial and alveolar edema bilaterally. There is consolidation in both lung bases. Heart is enlarged with pulmonary venous hypertension. Patient is status post coronary artery bypass grafting. No adenopathy.  IMPRESSION:  Congestive heart failure. Question alveolar edema causing consolidation in the bases versus superimposed pneumonia. Both entities may exist concurrently. No pneumothorax. Central catheter tip in superior vena cava.   Electronically Signed   By: Lowella Grip III M.D.   On: 12/14/2014 14:51   Current Medications:  . antiseptic oral rinse  7 mL Mouth Rinse q12n4p  . chlorhexidine  15 mL Mouth Rinse BID  . free water  100 mL Per Tube Q4H  . heparin  5,000 Units Subcutaneous 3 times per day  . levothyroxine  75 mcg Oral QAC breakfast  . LORazepam  0.5 mg Intravenous Once  . sodium chloride  10-40 mL Intracatheter Q12H   . sodium chloride 10 mL/hr at 12/13/14 0400  . dextrose 50 mL/hr at 12/15/14 0423  . diltiazem (CARDIZEM) infusion 5 mg/hr (12/15/14 0612)    ASSESSMENT AND PLAN: 02/15 labs pending 1. Closed left hip fracture.  -s/p left hemiarthroplasty 02/10.   2. Acute on Chronic diastolic CHF.  - BNP elevated.  - He had lower extremity edema, improved.  - Urine output improved. I/O 1638/1255 for 02/14, Net negative 1905   - 2D ECHO Shows EF 50-55%, no RWMA, mod AS, mod MR, severe LA dilation, mod-sev TR, PAS 55. -- CXR w/ CHF. -- Lasix 60 mg IV yest afternoon, 20 mg IV last pm - Discuss Lasix dosing w/ MD, he is volume overloaded but Cr is very high and BP is low  3. Demand ischemia with elevated troponins.  - Flat troponin curve. Related to CHF, CKD, respiratory problems and recent stress of fracture.  - No chest pain.  - Ecg without acute ST changes.  4. Afib.  - Rate elevated due to acute illness. - on IV Cardizem 5 mg/hr, not able to titrate due to hypotension  - continue to monitor.  - Not a candidate for long term anticoagulation due to GIB. on [place] heparin q 8 post hip fracture   5. Acute on CKD.  - Creatinine increased 2.3>2.58>3.07> 3.96>4.29>4.52>?. - - Suspect ATN with hypotension intraop.    6. Anemia acute on chronic (anemia of critical illness)  -  Hb has dropped through this hospitalization. - Followed by [facility].   7. HTN - now hypotensive.  - was on phenylephrine, d/c'd 02/12.  8. Moderate aortic stenosis- repeat 2d ECHO Mean gradient (S): 53mm Hg. Valve area (VTI): 0.74 cm^2. Valve area (Vmax): 0.72 cm^2. Valve area (Vmean): 0.7 cm^2.  9. CAD s/p CABG  10. Acute respiratory failure.  - extubated 02/10  11. He is a DNR. - discussions with family regarding comfort care -  he is not eating, has NG tube in place  No further suggestions.    Jonetta Speak , PA-C 7:46 AM 12/15/2014

## 2014-12-15 NOTE — Procedures (Signed)
Objective Swallowing Evaluation: Fiberoptic Endoscopic Evaluation of Swallowing  Patient Details  Name: [name] [name] MRN: [id number] Date of Birth: [birth year]  Today's Date: 12/15/2014 Time: SLP Start Time (ACUTE ONLY): 1325-SLP Stop Time (ACUTE ONLY): 1420 SLP Time Calculation (min) (ACUTE ONLY): 55 min  Past Medical History:  Past Medical History  Diagnosis Date  . Hypertension   . History of prostate cancer 1996    treated with radiation  . Arthritis   . Gout, arthritis 2010    bil feet  . Cancer     PORSTATE-HX OF RADIATION AND SURGERY  . Diverticulosis of colon   . CAD (coronary artery disease)     cabg 1996; echo 03/15/2007-EF 45-50%, LA mod to severe dilated, mod TR, mod pulmonary HTN, mod sclerotic aortic valve; myoview6/23/2010- no ischemia  . A-fib     chronic, no longer on coumadin due to rectal bleeding  . Hyperlipidemia   . Rectal bleed   . CHF (congestive heart failure) 10/18/13   Past Surgical History:  Past Surgical History  Procedure Laterality Date  . Coronary artery bypass graft  04/1995    LIMA to LAD, vein graft to diag branch, vein to 3 sequential marginal branches, and vein to PDA  . Appendectomy  1950s  . Eye surgery  2009    cataract surgery  . Cryoablation of prostate  06/23/2005  . Colonoscopy  11/03/2000  . Cardiac catheterization  05/04/2005    patent grafts  . Cardiac catheterization  04/10/1995    presyncopal episode on way to OR, 3V disease with left main involvement, nl LV, CVTS was contacted  . Hip arthroplasty Left 12/09/2014    Procedure: LEFT HIP HEMIARTHROPLASTY;  Surgeon: Mcarthur Rossetti, MD;  Location: [facility];  Service: Orthopedics;  Laterality: Left;   HPI:  HPI: 81 year old male admitted 12/08/14 after fall at home, sustaining hip fracture. Orders received to evaluate for po readiness  No Data Recorded  Assessment / Plan / Recommendation CHL IP CLINICAL IMPRESSIONS 12/15/2014  Dysphagia Diagnosis Moderate oral phase  dysphagia;Moderate pharyngeal phase dysphagia  Clinical impression Pt presents with a moderate oral and oropharyngeal dysphagia with motor and sensory deficits. Pt with prolonged oral transit due to need for oral respiration. Keeping boluses small (teaspoon) aided in limiting spillage and delay to the valleculae with nectar thick liquids. Oropharyngeal hygiene was severely impaired: pt with dried yellow secretions throughout oropharynx initially. As pt was given nectar thick liquids, most but not all secretions cleared to reveal mildly erythematous tissue, particularly around the arytenoids. There was mild penetration with initial trials, but as secretions cleared, timing and strength improved (pt coughed following each swallow even if penetration did not occur). Pt did have persistent delay to the valleculae and weak base of tongue and epiglottic inversion. Recommend a dys 1 (puree) diet with nectar thick liquids via teaspoon with supervision for a second swallow. Pt may also have ice chips following oral care. SLP will follow for tolerance.       CHL IP TREATMENT RECOMMENDATION 12/15/2014  Treatment Plan Recommendations Therapy as outlined in treatment plan below     CHL IP DIET RECOMMENDATION 12/15/2014  Diet Recommendations Dysphagia 1 (Puree);Nectar-thick liquid  Liquid Administration via Spoon  Medication Administration Crushed with puree  Compensations Slow rate;Small sips/bites;Multiple dry swallows after each bite/sip  Postural Changes and/or Swallow Maneuvers Seated upright 90 degrees     CHL IP OTHER RECOMMENDATIONS 12/15/2014  Recommended Consults (None)  Oral Care Recommendations Oral care Q4  per protocol  Other Recommendations Have oral suction available     CHL IP FOLLOW UP RECOMMENDATIONS 12/15/2014  Follow up Recommendations Skilled Nursing facility     [facility] IP FREQUENCY AND DURATION 12/15/2014  Speech Therapy Frequency (ACUTE ONLY) min 2x/week  Treatment Duration 2 weeks      Pertinent Vitals/Pain NA    SLP Swallow Goals No flowsheet data found.  No flowsheet data found.    CHL IP REASON FOR REFERRAL 12/15/2014  Reason for Referral Objectively evaluate swallowing function     CHL IP ORAL PHASE 12/15/2014  Lips (None)  Tongue (None)  Mucous membranes (None)  Nutritional status (None)  Other (None)  Oxygen therapy (None)  Oral Phase Impaired  Oral - Pudding Teaspoon (None)  Oral - Pudding Cup (None)  Oral - Honey Teaspoon (None)  Oral - Honey Cup (None)  Oral - Honey Syringe (None)  Oral - Nectar Teaspoon Lingual pumping;Reduced posterior propulsion;Delayed oral transit  Oral - Nectar Cup (None)  Oral - Nectar Straw (None)  Oral - Nectar Syringe (None)  Oral - Ice Chips (None)  Oral - Thin Teaspoon (None)  Oral - Thin Cup (None)  Oral - Thin Straw (None)  Oral - Thin Syringe (None)  Oral - Puree Lingual pumping;Reduced posterior propulsion;Delayed oral transit  Oral - Mechanical Soft (None)  Oral - Regular (None)  Oral - Multi-consistency (None)  Oral - Pill (None)  Oral Phase - Comment (None)      CHL IP PHARYNGEAL PHASE 12/15/2014  Pharyngeal Phase Impaired  Pharyngeal - Pudding Teaspoon (None)  Penetration/Aspiration details (pudding teaspoon) (None)  Pharyngeal - Pudding Cup (None)  Penetration/Aspiration details (pudding cup) (None)  Pharyngeal - Honey Teaspoon (None)  Penetration/Aspiration details (honey teaspoon) (None)  Pharyngeal - Honey Cup (None)  Penetration/Aspiration details (honey cup) (None)  Pharyngeal - Honey Syringe (None)  Penetration/Aspiration details (honey syringe) (None)  Pharyngeal - Nectar Teaspoon Delayed swallow initiation;Reduced epiglottic inversion;Reduced tongue base retraction;Penetration/Aspiration before swallow;Pharyngeal residue - valleculae  Penetration/Aspiration details (nectar teaspoon) Material enters airway, remains ABOVE vocal cords then ejected out;Material enters airway, CONTACTS cords then  ejected out;Material does not enter airway  Pharyngeal - Nectar Cup (None)  Penetration/Aspiration details (nectar cup) (None)  Pharyngeal - Nectar Straw (None)  Penetration/Aspiration details (nectar straw) (None)  Pharyngeal - Nectar Syringe (None)  Penetration/Aspiration details (nectar syringe) (None)  Pharyngeal - Ice Chips Delayed swallow initiation;Reduced epiglottic inversion;Lateral channel residue  Penetration/Aspiration details (ice chips) (None)  Pharyngeal - Thin Teaspoon (None)  Penetration/Aspiration details (thin teaspoon) (None)  Pharyngeal - Thin Cup (None)  Penetration/Aspiration details (thin cup) (None)  Pharyngeal - Thin Straw (None)  Penetration/Aspiration details (thin straw) (None)  Pharyngeal - Thin Syringe (None)  Penetration/Aspiration details (thin syringe') (None)  Pharyngeal - Puree Delayed swallow initiation;Reduced epiglottic inversion;Reduced tongue base retraction;Pharyngeal residue - valleculae  Penetration/Aspiration details (puree) (None)  Pharyngeal - Mechanical Soft (None)  Penetration/Aspiration details (mechanical soft) (None)  Pharyngeal - Regular (None)  Penetration/Aspiration details (regular) (None)  Pharyngeal - Multi-consistency (None)  Penetration/Aspiration details (multi-consistency) (None)  Pharyngeal - Pill (None)  Penetration/Aspiration details (pill) (None)  Pharyngeal Comment (None)     No flowsheet data found.  No flowsheet data found.        Supervised and reviewed by [facility] MA CCC-SLP  May Ozment, Katherene Ponto 12/15/2014, 2:48 PM

## 2014-12-16 DIAGNOSIS — E871 Hypo-osmolality and hyponatremia: Secondary | ICD-10-CM | POA: Insufficient documentation

## 2014-12-16 DIAGNOSIS — I272 Pulmonary hypertension, unspecified: Secondary | ICD-10-CM | POA: Insufficient documentation

## 2014-12-16 DIAGNOSIS — I270 Primary pulmonary hypertension: Secondary | ICD-10-CM

## 2014-12-16 DIAGNOSIS — I5033 Acute on chronic diastolic (congestive) heart failure: Secondary | ICD-10-CM | POA: Insufficient documentation

## 2014-12-16 LAB — COMPREHENSIVE METABOLIC PANEL
ALT: 45 U/L (ref 0–53)
AST: 31 U/L (ref 0–37)
Albumin: 1.9 g/dL — ABNORMAL LOW (ref 3.5–5.2)
Alkaline Phosphatase: 72 U/L (ref 39–117)
Anion gap: 13 (ref 5–15)
BILIRUBIN TOTAL: 1.5 mg/dL — AB (ref 0.3–1.2)
BUN: 94 mg/dL — AB (ref 6–23)
CALCIUM: 8.1 mg/dL — AB (ref 8.4–10.5)
CHLORIDE: 107 mmol/L (ref 96–112)
CO2: 25 mmol/L (ref 19–32)
CREATININE: 3.97 mg/dL — AB (ref 0.50–1.35)
GFR calc Af Amer: 14 mL/min — ABNORMAL LOW (ref >90)
GFR, EST NON AFRICAN AMERICAN: 12 mL/min — AB (ref >90)
GLUCOSE: 246 mg/dL — AB (ref 70–99)
POTASSIUM: 3.3 mmol/L — AB (ref 3.5–5.1)
Sodium: 145 mmol/L (ref 135–145)
Total Protein: 6.1 g/dL (ref 6.0–8.3)

## 2014-12-16 LAB — TYPE AND SCREEN
ABO/RH(D): O NEG
ANTIBODY SCREEN: NEGATIVE
UNIT DIVISION: 0
UNIT DIVISION: 0

## 2014-12-16 LAB — CBC
HEMATOCRIT: 31.1 % — AB (ref 39.0–52.0)
HEMOGLOBIN: 10 g/dL — AB (ref 13.0–17.0)
MCH: 27.9 pg (ref 26.0–34.0)
MCHC: 32.2 g/dL (ref 30.0–36.0)
MCV: 86.6 fL (ref 78.0–100.0)
Platelets: 130 10*3/uL — ABNORMAL LOW (ref 150–400)
RBC: 3.59 MIL/uL — AB (ref 4.22–5.81)
RDW: 17.2 % — ABNORMAL HIGH (ref 11.5–15.5)
WBC: 8 10*3/uL (ref 4.0–10.5)

## 2014-12-16 MED ORDER — FUROSEMIDE 10 MG/ML IJ SOLN
80.0000 mg | Freq: Once | INTRAMUSCULAR | Status: AC
  Administered 2014-12-16: 80 mg via INTRAVENOUS
  Filled 2014-12-16: qty 8

## 2014-12-16 MED ORDER — FUROSEMIDE 10 MG/ML IJ SOLN
INTRAMUSCULAR | Status: AC
  Filled 2014-12-16: qty 4

## 2014-12-16 MED ORDER — ADULT MULTIVITAMIN W/MINERALS CH
1.0000 | ORAL_TABLET | Freq: Every day | ORAL | Status: DC
  Administered 2014-12-16 – 2014-12-22 (×7): 1 via ORAL
  Filled 2014-12-16 (×7): qty 1

## 2014-12-16 MED ORDER — PRO-STAT SUGAR FREE PO LIQD
30.0000 mL | Freq: Two times a day (BID) | ORAL | Status: DC
  Administered 2014-12-16 – 2014-12-21 (×9): 30 mL via ORAL
  Filled 2014-12-16 (×12): qty 30

## 2014-12-16 MED ORDER — DILTIAZEM HCL ER 90 MG PO CP12
90.0000 mg | ORAL_CAPSULE | Freq: Two times a day (BID) | ORAL | Status: DC
  Administered 2014-12-16 – 2014-12-18 (×4): 90 mg via ORAL
  Filled 2014-12-16 (×6): qty 1

## 2014-12-16 MED ORDER — FUROSEMIDE 10 MG/ML IJ SOLN
60.0000 mg | Freq: Every day | INTRAMUSCULAR | Status: DC
  Administered 2014-12-17 – 2014-12-19 (×3): 60 mg via INTRAVENOUS
  Filled 2014-12-16 (×3): qty 6

## 2014-12-16 MED ORDER — DILTIAZEM HCL ER 90 MG PO CP12
90.0000 mg | ORAL_CAPSULE | Freq: Two times a day (BID) | ORAL | Status: DC
  Filled 2014-12-16 (×2): qty 1

## 2014-12-16 MED ORDER — FUROSEMIDE 10 MG/ML IJ SOLN: 60.0000 mg | Freq: Every day | INTRAMUSCULAR | Status: DC

## 2014-12-16 NOTE — Clinical Social Work Note (Signed)
Patients family (daughter Arbie Cookey) is agreeable to SNF stay.  CSW initiated bed search- family preference for [facility].  CSW will continue to follow.  Domenica Reamer, [place] Social Worker [phone]

## 2014-12-16 NOTE — Progress Notes (Signed)
Speech Language Pathology Treatment: Dysphagia  Patient Details Name: [name] [name] MRN: [id number] DOB: 20-Nov-1920 Today's Date: 12/16/2014 Time: 5170-0174 SLP Time Calculation (min) (ACUTE ONLY): 14 min  Assessment / Plan / Recommendation Clinical Impression     Goal of session was to determine pt tolerance of current dys 1/ nectar thick liquid diet. Pt was responsive during session. PO trials with teaspoon amounts of purees and nectar thin liquids did not elicit s/s of aspiration. Baseline cough was observed intermittently. Recommend pt continue on dys 1/ nectar thick liquid diet; only teaspoon amounts. Discussed with nurse tech that pt should only be given teaspoon amounts of food. Speech will follow-up for diet tolerance and possible upgrade   HPI HPI: 81 year old male admitted 12/08/14 after fall at home, sustaining hip fracture. Orders received to evaluate for po readiness   Pertinent Vitals    SLP Plan  Continue with current plan of care    Recommendations Diet recommendations: Nectar-thick liquid;Dysphagia 1 (puree) Liquids provided via: Teaspoon Medication Administration: Crushed with puree Supervision: Staff to assist with self feeding Compensations: Slow rate;Small sips/bites Postural Changes and/or Swallow Maneuvers: Seated upright 90 degrees              Oral Care Recommendations: Oral care Q4 per protocol Follow up Recommendations: Skilled Nursing facility Plan: Continue with current plan of care    GO     [name] [name] Zerbe 12/16/2014, 10:49 AM

## 2014-12-16 NOTE — Progress Notes (Signed)
[facility] TEAM 1 - Stepdown/ICU TEAM Progress Note  [name] [name] PQD:[id number] DOB: 03/17/1921 DOA: 12/08/2014 PCP: [name], MD  Admit HPI / Brief Narrative: [name] [name] is a 81 y.o. male, who lives at home and has home health aides assist him. PMHx chronic respiratory failure on home O2,  moderate aortic stenosis with severe pulmonary hypertension and RV dilatation chronic diastolic heart failure, atrial fibrillation not on anticoagulation secondary to GI bleed, S/P CABG 1996 prostate cancer, S/P surgery + XRT, chronic kidney disease, After taking a bath today and while attempting to put on his clothes, he slipped and fell and was complaining of left hip pain. Upon presentation to the ER he was found to have an acute subcapital fracture of the left hip. Orthopedic surgery has been consulted by the EDP. Patient has a history of skipping diuretic dosages because of chronic urinary dribbling/incontinence. Recently he has developed progression of respiratory symptoms and is now requiring oxygen at home. His chest x-ray in the ER revealed mild interstitial edema and associated small bilateral pleural effusions. He is requiring oxygen in the emergency department.    HPI/Subjective: 2/16  A/O 1, patient alert, sitting comfortably in bed interacting appropriately with family and staff. Family states that patient did it is breakfast this morning. Family stated patient had been standing with the help of physical therapy however had not walked yet.   Assessment/Plan: Closed fracture of left hip  -Per surgery can be up with therapy with full weight bearing and no hip precautions.  Acute encephalopathy  -Most likely multifactorial to include electrolyte abnormalities, acute on chronic respiratory failure, acute on chronic renal failure poorly controlled atrial fib resulting in hypotension -Encephalopathy starting to clear, however patient still A/O 1  -Neuro check q  4hr  Hyponatremia -Improving  -See acute encephalopathy  Acute on chronic hypoxemic respiratory failure -Multifactorial to include Moderate aortic stenosis,severe pulmonary hypertension, and /RV dilatation  Atrial fibrillation with RVR -DC Cardizem drip, convert to Cardizem  90 mg BID -Continue Lopressor PRN -Not on anticoagulation by: High fall risk  Acute on Chronic diastolic heart failure, NYHA class 1 -Unkown Base Weight -Current heart exacerbation seems driven by patient noncompliance with diuretics at home -BNP 800 -Continue Foley catheter and begin Lasix 60 mg IV daily -Continue supportive care with oxygen maintain SPO2>93% -Transfuse for hemoglobin<8 -2/14 Transfuse 2 units PRBC, Lasix 20 units to be administered between units -2/15 transfuse 1 unit PRBC   Pulmonary HTN -See Acute  On Chronic Diastolic CHF   HTN  -Patient BP remain soft most likely secondary to poor cardiac function, poorly controlled atrial fibrillation, acute on chronic renal failure  -Gently diuresis; Lasix 60 mg daily   CAD/CABG 1996  Acute on Chronic Renal failure(Baseline ~2.03) -2/14 Patient's Cr creatinine has continued to trend up since admission. Consult nephrology if no improvement of renal function with Blood products and gentle diuresis -Strict in and out; since admission -4.6 L -Strict daily weight admission bed weight= 90.4 kg                      2/16 bed weight= 86.7 kg -2/14 Patient fluid overloaded  Lasix 60 mg x 1  -Would start scheduled gentle diuresis in the Am  History of prostatic problems and urinary incontinence -Check urinalysis and culture  Anemia -Baseline hemoglobin around 9.1 -See acute on chronic diastolic heart failure  Dyslipidemia -No medications prior to admission  Hypothyroidism -Continue Synthroid -2/8 TSH; within normal limit  Code Status: FULL Family Communication: Family present   Disposition Plan: SNF? Hospice?    Consultants: Dr.  Mcarthur Rossetti (orthopedic surgery) Dr. Peter M Martinique (Cardiology) Dr.David B Simonds (PCCM)   Procedure/Significant Events: 2/10 echocardiogram;Left ventricle: moderate concentric hypertrophy. LVEF= 50% to 55%.  - Aortic valve: moderate stenosis.  - Mitral valve: moderate regurgitation.  - Left/Right atrium: severely dilated. - Right ventricle: moderately dilated.  -Tricuspid valve:moderate-severe regurgitation. - Pulmonary arteries:PA peak pressure: 55 mm Hg (S).   Culture NA  Antibiotics: None  DVT prophylaxis: SCD   Devices   LINES / TUBES:      Continuous Infusions: . dextrose 30 mL (12/15/14 1538)  . diltiazem (CARDIZEM) infusion 5 mg/hr (12/16/14 0039)    Objective: VITAL SIGNS: Temp: 97.9 F (36.6 C) (02/16 0838) Temp Source: Oral (02/16 0838) BP: 118/70 mmHg (02/16 0838) Pulse Rate: 99 (02/16 0838) SPO2; FIO2:   Intake/Output Summary (Last 24 hours) at 12/16/14 1408 Last data filed at 12/16/14 0841  Gross per 24 hour  Intake 602.67 ml  Output   3200 ml  Net -2597.33 ml     Exam: General: A/O 1 (does not know where, when, why), awake alert follows all commands no respiratory distress  Lungs: diffuse bilateral rhonchi (significantly improved over the last 48 hours), decreased bibasilar breath sounds, negative crackles Cardiovascular: Irregular irregular rhythm and rate, without murmur gallop or rub normal S1 and S2 Abdomen: Nontender, nondistended, soft, bowel sounds positive, no rebound, no ascites, no appreciable mass Extremities: No significant cyanosis, clubbing. Bilateral pedal edema 1-2+ ,    Data Reviewed: Basic Metabolic Panel:  Recent Labs Lab 12/12/14 0400 12/13/14 0400 12/14/14 0415 12/14/14 0910 12/15/14 0550 12/16/14 0500  NA 143 147* 149*  --  149* 145  K 3.4* 3.9 4.0  --  4.1 3.3*  CL 111 110 111  --  112 107  CO2 22 27 25   --  28 25  GLUCOSE 137* 102* 79  --  111* 246*  BUN 83* 88* 94*  --  101* 94*   CREATININE 4.29* 4.35* 4.52*  --  4.42* 3.97*  CALCIUM 8.2* 8.4 8.4  --  8.5 8.1*  MG  --   --   --  1.8 1.9  --    Liver Function Tests:  Recent Labs Lab 12/11/14 0400 12/14/14 1930 12/15/14 0550 12/16/14 0500  AST 95*  --  40* 31  ALT 79*  --  57* 45  ALKPHOS 72  --  77 72  BILITOT 0.8  --  1.7* 1.5*  PROT 5.5*  --  6.3 6.1  ALBUMIN 2.1* 2.1* 1.9* 1.9*   No results for input(s): LIPASE, AMYLASE in the last 168 hours. No results for input(s): AMMONIA in the last 168 hours. CBC:  Recent Labs Lab 12/11/14 0400 12/12/14 0400 12/14/14 0910 12/14/14 1930 12/16/14 0500  WBC 9.7 7.7 10.2 9.9 8.0  NEUTROABS 8.2*  --  8.8* 8.2*  --   HGB 8.4* 8.1* 7.5* 7.9* 10.0*  HCT 26.1* 24.8* 23.9* 25.3* 31.1*  MCV 87.3 85.5 88.5 87.8 86.6  PLT 127* 118* 127* 141* 130*   Cardiac Enzymes:  Recent Labs Lab 12/09/14 1810 12/09/14 2146 12/10/14 0144 12/10/14 0422 12/10/14 0644  TROPONINI 1.35* 1.41* 1.44* 1.42* 1.37*   BNP (last 3 results)  Recent Labs  12/08/14 1211  BNP 800.1*    ProBNP (last 3 results) No results for input(s): PROBNP in the last 8760 hours.  CBG:  Recent Labs Lab 12/11/14  6387 12/11/14 0755 12/11/14 1127 12/11/14 1549 12/11/14 1914  GLUCAP 110* 114* 136* 136* 123*    Recent Results (from the past 240 hour(s))  Urine culture     Status: None   Collection Time: 12/09/14  1:10 AM  Result Value Ref Range Status   Specimen Description URINE, RANDOM  Final   Special Requests NONE  Final   Colony Count   Final    >=100,000 COLONIES/ML Performed at [facility]    Culture   Final    ESCHERICHIA COLI Performed at [facility]    Report Status 12/11/2014 FINAL  Final   Organism ID, Bacteria ESCHERICHIA COLI  Final      Susceptibility   Escherichia coli - MIC*    AMPICILLIN 4 SENSITIVE Sensitive     CEFAZOLIN <=4 SENSITIVE Sensitive     CEFTRIAXONE <=1 SENSITIVE Sensitive     CIPROFLOXACIN >=4 RESISTANT Resistant      GENTAMICIN <=1 SENSITIVE Sensitive     LEVOFLOXACIN >=8 RESISTANT Resistant     NITROFURANTOIN <=16 SENSITIVE Sensitive     TOBRAMYCIN <=1 SENSITIVE Sensitive     TRIMETH/SULFA <=20 SENSITIVE Sensitive     PIP/TAZO <=4 SENSITIVE Sensitive     * ESCHERICHIA COLI  MRSA PCR Screening     Status: Abnormal   Collection Time: 12/09/14 11:42 PM  Result Value Ref Range Status   MRSA by PCR POSITIVE (A) NEGATIVE Final    Comment:        The GeneXpert MRSA Assay (FDA approved for NASAL specimens only), is one component of a comprehensive MRSA colonization surveillance program. It is not intended to diagnose MRSA infection nor to guide or monitor treatment for MRSA infections. RESULT CALLED TO, READ BACK BY AND VERIFIED WITH: CALLED TO RN Emerson Monte [phone] @[phone]  THANEY   Urine culture     Status: None   Collection Time: 12/10/14 12:55 PM  Result Value Ref Range Status   Specimen Description URINE, CATHETERIZED  Final   Special Requests NONE  Final   Colony Count   Final    >=100,000 COLONIES/ML Performed at [facility]    Culture   Final    ESCHERICHIA COLI Performed at [facility]    Report Status 12/12/2014 FINAL  Final   Organism ID, Bacteria ESCHERICHIA COLI  Final      Susceptibility   Escherichia coli - MIC*    AMPICILLIN 4 SENSITIVE Sensitive     CEFAZOLIN <=4 SENSITIVE Sensitive     CEFTRIAXONE <=1 SENSITIVE Sensitive     CIPROFLOXACIN >=4 RESISTANT Resistant     GENTAMICIN <=1 SENSITIVE Sensitive     LEVOFLOXACIN >=8 RESISTANT Resistant     NITROFURANTOIN <=16 SENSITIVE Sensitive     TOBRAMYCIN <=1 SENSITIVE Sensitive     TRIMETH/SULFA <=20 SENSITIVE Sensitive     PIP/TAZO <=4 SENSITIVE Sensitive     * ESCHERICHIA COLI  Clostridium Difficile by PCR     Status: None   Collection Time: 12/12/14  4:14 PM  Result Value Ref Range Status   C difficile by pcr NEGATIVE NEGATIVE Final     Studies:  Recent x-ray studies have been reviewed in  detail by the Attending Physician  Scheduled Meds:  Scheduled Meds: . antiseptic oral rinse  7 mL Mouth Rinse q12n4p  . chlorhexidine  15 mL Mouth Rinse BID  . furosemide      . heparin  5,000 Units Subcutaneous 3 times per day  . levothyroxine  75 mcg Oral QAC breakfast  . LORazepam  0.5 mg Intravenous Once    Time spent on care of this patient: 40 mins   Allie Bossier [facility]  [facility] Office  [phone] Pager - [phone]  On-Call/Text Page:      [email or web address]      password [email or web address]  If 7PM-7AM, please contact night-coverage [email or web address] Password [email or web address] 12/16/2014, 2:08 PM   LOS: 8 days   Care during the described time interval was provided by me .  I have reviewed this patient's available data, including medical history, events of note, physical examination, radiology studies and test results as part of my evaluation  Dia Crawford, MD [phone] Pager

## 2014-12-16 NOTE — Progress Notes (Signed)
Physical Therapy Treatment Patient Details Name: [name] [name] MRN: [id number] DOB: 09/12/1921 Today's Date: 12/16/2014    History of Present Illness 81 y.o. M who sustained left hip fx on 2/8 after mechanical fall at home. Taken to OR 2/9 for left hip hemiarthroplasty and following OR, returned to ICU on vent. Course complicated by AKI. Was living at home with hospice care.    PT Comments    Pt making slow progress with mobility.  Follow Up Recommendations  SNF     Equipment Recommendations  None recommended by PT    Recommendations for Other Services       Precautions / Restrictions Precautions Precautions: None (per ortho MD, no hip precautions) Restrictions LLE Weight Bearing: Weight bearing as tolerated    Mobility  Bed Mobility Overal bed mobility: Needs Assistance Bed Mobility: Supine to Sit;Sit to Supine     Supine to sit: +2 for physical assistance;Mod assist Sit to supine: +2 for physical assistance;Max assist   General bed mobility comments: Assist to bring legs over and elevate trunk into sitting. Assist to lower trunk and bring legs back up into bed to return to supine.  Transfers Overall transfer level: Needs assistance Equipment used: Rolling walker (2 wheeled);Ambulation equipment used Charlaine Dalton) Transfers: Sit to/from Stand Sit to Stand: +2 physical assistance;Max assist         General transfer comment: Heavy assist to bring hips up. Pt with flexed trunk and leaning to the rt.  Pt's RLE pushing forward coming to stand even when we attempted to block foot resulting in rt foot too far in front of pt resulting in posterior lean in pt.  Stood x 2 with Stedy and x 1 with walker.  Ambulation/Gait                 Stairs            Wheelchair Mobility    Modified Rankin (Stroke Patients Only)       Balance                                    Cognition Arousal/Alertness: Awake/alert Behavior During Therapy: WFL for  tasks assessed/performed Overall Cognitive Status: Impaired/Different from baseline Area of Impairment: Following commands;Problem solving;Memory     Memory: Decreased short-term memory Following Commands: Follows one step commands with increased time     Problem Solving: Slow processing;Requires verbal cues;Requires tactile cues      Exercises      General Comments        Pertinent Vitals/Pain Faces Pain Scale: Hurts little more Pain Location: lt hip Pain Descriptors / Indicators: Sore Pain Intervention(s): Limited activity within patient's tolerance;Monitored during session;Repositioned    Home Living                      Prior Function            PT Goals (current goals can now be found in the care plan section) Progress towards PT goals: Progressing toward goals    Frequency  Min 3X/week    PT Plan Current plan remains appropriate    Co-evaluation             End of Session Equipment Utilized During Treatment: Gait belt;Oxygen Activity Tolerance: Patient limited by fatigue Patient left: in bed;with call bell/phone within reach;with family/visitor present     Time: 7782-4235 PT Time Calculation (min) (  ACUTE ONLY): 29 min  Charges:  $Gait Training: 23-37 mins                    G Codes:      [name] [name] 12-21-2014, 2:11 PM  [facility] PT [phone]

## 2014-12-16 NOTE — Progress Notes (Signed)
NUTRITION FOLLOW UP  Intervention:   Provide Multivitamin with minerals daily Provide 30 ml of Pro-stat BID, each dose provides 15 grams of protein and 100 kcal Encourage adequate/healthful PO intake RD to continue to monitor for PO adequacy  Nutrition Dx:   Inadequate oral intake related to inability to eat as evidenced by NPO/Vent status; discontinued  New Nutrition Dx:   Increased nutrient needs related to wound healing and fracture healing as evidenced by estimated needs.   Goal: Enteral nutrition to provide 60-70% of estimated calorie needs (22-25 kcals/kg ideal body weight) and 100% of estimated protein needs, based on ASPEN guidelines for hypocaloric, high protein feeding in critically ill obese individuals; discontinued  New Goal: Pt to meet >/= 90% of their estimated nutrition needs   Monitor:   Vent status, TF initiation/tolerance, weight trend, labs  Assessment:   81 year old patient with a history of severe pulmonary hypertension, moderate aortic stenosis, chronic diastolic heart failure with a close left hip fracture following a mechanical fall. Taken to OR 2/9 for left hip hemiarthroplasty and following OR, returned to ICU on vent.  Pt was extubated on 2/12 and diet was advanced to Dysphagia 1 with nectar-thick liquids yesterday afternoon. Per nursing notes, pt only ate 5% of dinner last night. Today, pt states that his appetite is good and he is eating well. Per family at bedside, pt ate 100% of lunch. Pt's edema has improved. His weight has dropped 11 lbs since admission. Pt is unsure how much he usually weighs; he reports eating well PTA.   Labs: low GFR, high BUN, low hemoglobin, low potassium, elevated glucose, low calcium, low albumin   Height: Ht Readings from Last 1 Encounters:  12/09/14 5\' 5"  (1.651 m)    Weight Status:   Wt Readings from Last 1 Encounters:  12/16/14 191 lb 2.2 oz (86.7 kg)    Re-estimated needs:  Kcal: 1600-1800 Protein: 90-105  grams Fluid: 2.5 L/day  Skin: stage 1 pressure ulcer on mid lower sacrum; +1 generalized edema; +2 RLE and LLE edema  Diet Order: DIET - DYS 1 , Nectar-thick liquids    Intake/Output Summary (Last 24 hours) at 12/16/14 1458 Last data filed at 12/16/14 0841  Gross per 24 hour  Intake 602.67 ml  Output   3200 ml  Net -2597.33 ml    Last BM: 2/14   Labs:   Recent Labs Lab 12/14/14 0415 12/14/14 0910 12/15/14 0550 12/16/14 0500  NA 149*  --  149* 145  K 4.0  --  4.1 3.3*  CL 111  --  112 107  CO2 25  --  28 25  BUN 94*  --  101* 94*  CREATININE 4.52*  --  4.42* 3.97*  CALCIUM 8.4  --  8.5 8.1*  MG  --  1.8 1.9  --   GLUCOSE 79  --  111* 246*    CBG (last 3)  No results for input(s): GLUCAP in the last 72 hours.  Scheduled Meds: . antiseptic oral rinse  7 mL Mouth Rinse q12n4p  . chlorhexidine  15 mL Mouth Rinse BID  . diltiazem  90 mg Oral Q12H  . furosemide      . [START ON 12/17/2014] furosemide  60 mg Intravenous Daily  . heparin  5,000 Units Subcutaneous 3 times per day  . levothyroxine  75 mcg Oral QAC breakfast  . LORazepam  0.5 mg Intravenous Once    Continuous Infusions: . dextrose 30 mL (12/15/14 1538)  . diltiazem (  CARDIZEM) infusion 5 mg/hr (12/16/14 0039)    Pryor Ochoa RD, LDN Inpatient Clinical Dietitian Pager: [phone] After Hours Pager: [phone]

## 2014-12-16 NOTE — Progress Notes (Signed)
Patient Name: [name] [name] Date of Encounter: 12/16/2014  Principal Problem:   Closed left hip fracture Active Problems:   CKD (chronic kidney disease) stage 3, GFR 30-59 ml/min   Anemia   HTN (hypertension)   Dyslipidemia   Moderate aortic stenosis   Closed fracture of left hip   Hip fracture requiring operative repair   CAD in native artery, s/p CABG 1996 with patent grafts 2006   Acute on chronic diastolic congestive heart failure   Pulmonary arterial hypertension   Demand ischemia   Preoperative cardiovascular examination   Acute respiratory failure   CKD (chronic kidney disease)   AKI (acute kidney injury)   Acute pulmonary edema   Atrial fibrillation with RVR   Acute on chronic respiratory failure with hypoxia   Acute on chronic renal failure   Essential hypertension   Other specified hypothyroidism   Primary Cardiologist: Dr. Gwenlyn Found   Patient Profile: 81 year old male with a history of CABG in 1996. Grafts patent by cath 2006, nuc stress 03/2009 was low risk. Permanent atrial fibrillation, on Coumadin for [facility] but stopped 2014 due to GI bleed.AS, HTN, HLD, CKD 3. Admitted 02/08 w/ hip fx, cards seeing for elevated trop, D-CHF. To OR 02/10, extubated 02/12.  SUBJECTIVE:Feeling less SOB. Much improved today OBJECTIVE Filed Vitals:   12/16/14 0420 12/16/14 0425 12/16/14 0501 12/16/14 0838  BP:    118/70  Pulse:    99  Temp:    97.9 F (36.6 C)  TempSrc:    Oral  Resp:    22  Height:      Weight:      SpO2: 89% 92% 97% 94%    Intake/Output Summary (Last 24 hours) at 12/16/14 1152 Last data filed at 12/16/14 0841  Gross per 24 hour  Intake 757.67 ml  Output   3300 ml  Net -2542.33 ml   Filed Weights   12/14/14 1018 12/15/14 0428 12/16/14 0400  Weight: 190 lb 7.6 oz (86.4 kg) 199 lb 1.2 oz (90.3 kg) 191 lb 2.2 oz (86.7 kg)    PHYSICAL EXAM General: Well developed, well nourished, male in no acute distress. Head: Normocephalic, atraumatic. Neck:  Supple without bruits, JVD elevated 10 cm. Lungs: Resp regular and unlabored, rales bases. Heart: Irreg irreg, S1, S2, no S3, S4, 3/6 murmur; no rub. Abdomen: Soft, non-tender, non-distended, BS + x 4.  Extremities: No clubbing, cyanosis, no edema.  Neuro: Alert and oriented X 1. Moves all extremities spontaneously.  LABS: CBC:  Recent Labs  12/14/14 0910 12/14/14 1930 12/16/14 0500  WBC 10.2 9.9 8.0  NEUTROABS 8.8* 8.2*  --   HGB 7.5* 7.9* 10.0*  HCT 23.9* 25.3* 31.1*  MCV 88.5 87.8 86.6  PLT 127* 141* 413*   Basic Metabolic Panel:  Recent Labs  12/14/14 0910 12/15/14 0550 12/16/14 0500  NA  --  149* 145  K  --  4.1 3.3*  CL  --  112 107  CO2  --  28 25  GLUCOSE  --  111* 246*  BUN  --  101* 94*  CREATININE  --  4.42* 3.97*  CALCIUM  --  8.5 8.1*  MG 1.8 1.9  --    Liver Function Tests:  Recent Labs  12/15/14 0550 12/16/14 0500  AST 40* 31  ALT 57* 45  ALKPHOS 77 72  BILITOT 1.7* 1.5*  PROT 6.3 6.1  ALBUMIN 1.9* 1.9*   Cardiac Enzymes: Lab Results  Component Value Date  TROPONINI 1.37* 12/10/2014   BNP: B NATRIURETIC PEPTIDE  Date Value Ref Range Status  12/08/2014 800.1* 0.0 - 100.0 pg/mL Final   PRO B NATRIURETIC PEPTIDE (BNP)  Date/Time Value Ref Range Status  10/18/2013 01:50 PM 18021.0* 0 - 450 pg/mL Final  05/18/2013 06:07 AM 15727.0* 0 - 450 pg/mL Final   TELE:  Atrial fib, frequently RVR; 3 bt run NSVT      Radiology/Studies: Dg Chest Port 1 View 12/14/2014   CLINICAL DATA:  Shortness of Breath  EXAM: PORTABLE CHEST - 1 VIEW  COMPARISON:  December 12, 2014  FINDINGS: Endotracheal tube is no longer present. Central catheter tip is in the superior vena cava. Nasogastric tube is been removed. No pneumothorax.  There are bilateral pleural effusions. There is interstitial and alveolar edema bilaterally. There is consolidation in both lung bases. Heart is enlarged with pulmonary venous hypertension. Patient is status post coronary artery  bypass grafting. No adenopathy.  IMPRESSION: Congestive heart failure. Question alveolar edema causing consolidation in the bases versus superimposed pneumonia. Both entities may exist concurrently. No pneumothorax. Central catheter tip in superior vena cava.   Electronically Signed   By: Lowella Grip III M.D.   On: 12/14/2014 14:51   Current Medications:  . antiseptic oral rinse  7 mL Mouth Rinse q12n4p  . chlorhexidine  15 mL Mouth Rinse BID  . heparin  5,000 Units Subcutaneous 3 times per day  . levothyroxine  75 mcg Oral QAC breakfast  . LORazepam  0.5 mg Intravenous Once   . dextrose 30 mL (12/15/14 1538)  . diltiazem (CARDIZEM) infusion 5 mg/hr (12/16/14 0039)    ASSESSMENT AND PLAN:   1. Closed left hip fracture.  -s/p left hemiarthroplasty 02/10.   2. Acute on Chronic diastolic CHF.  - BNP elevated.  - He had lower extremity edema, improved.  - Urine output improved  Net negative 4.7L. Neg 2 L after 80 mg IV Lasix - 2D ECHO Shows EF 50-55%, no RWMA, mod AS, mod MR, severe LA dilation, mod-sev TR, PAS 55. - CXR w/ CHF. - Much improved in terms of volume overload. Will give another 80mg  IV Lasix and reassess tomorrow.    3. Demand ischemia with elevated troponins.  - Flat troponin curve. Related to CHF, CKD, respiratory problems and recent stress of fracture.  - No chest pain.  - Ecg without acute ST changes.  4. Afib  - Rate elevated due to acute illness. - on IV Cardizem 5 mg/hr, not able to titrate due to hypotension  - continue to monitor.  - Not a candidate for long term anticoagulation due to GIB. on [place] heparin q 8 post hip fracture   5. Acute on CKD.  - Creatinine increased 2.3>2.58>3.07> 3.96>4.29>4.52>3.97. - - Suspect ATN with hypotension intraop.    6. Anemia acute on chronic (anemia of critical illness)  - Now stable 10.0/31.1 post transfusion. Per IM   7. HTN - now hypotensive.  - was on phenylephrine, d/c'd 02/12.  8. Moderate aortic  stenosis- repeat 2d ECHO Mean gradient (S): 80mm Hg. Valve area (VTI): 0.74 cm^2. Valve area (Vmax): 0.72 cm^2. Valve area (Vmean): 0.7 cm^2.  9. CAD s/p CABG  10. Acute respiratory failure.  - extubated 02/10  11. He is a DNR. - discussions with family regarding comfort care - he is not eating, has NG tube in place    Judy Pimple , PA-C 11:52 AM 12/16/2014

## 2014-12-16 NOTE — Progress Notes (Signed)
Patient ID: [name] [name], male   DOB: 02/19/1921, 81 y.o.   MRN: [id number] No acute changes.  Creatinine a little better post blood transfusion.  Left hip dressing clean and dry.  Leg lengths equal.  Does follow basic commands.  From an ortho standpoint, he can be up with therapy with full weight bearing and no hip precautions.

## 2014-12-17 LAB — CBC
HEMATOCRIT: 31.8 % — AB (ref 39.0–52.0)
Hemoglobin: 9.9 g/dL — ABNORMAL LOW (ref 13.0–17.0)
MCH: 27.3 pg (ref 26.0–34.0)
MCHC: 31.1 g/dL (ref 30.0–36.0)
MCV: 87.6 fL (ref 78.0–100.0)
PLATELETS: 154 10*3/uL (ref 150–400)
RBC: 3.63 MIL/uL — ABNORMAL LOW (ref 4.22–5.81)
RDW: 17 % — AB (ref 11.5–15.5)
WBC: 9.3 10*3/uL (ref 4.0–10.5)

## 2014-12-17 LAB — BASIC METABOLIC PANEL
Anion gap: 6 (ref 5–15)
BUN: 93 mg/dL — AB (ref 6–23)
CO2: 31 mmol/L (ref 19–32)
Calcium: 8.1 mg/dL — ABNORMAL LOW (ref 8.4–10.5)
Chloride: 109 mmol/L (ref 96–112)
Creatinine, Ser: 3.74 mg/dL — ABNORMAL HIGH (ref 0.50–1.35)
GFR calc Af Amer: 15 mL/min — ABNORMAL LOW (ref >90)
GFR, EST NON AFRICAN AMERICAN: 13 mL/min — AB (ref >90)
Glucose, Bld: 239 mg/dL — ABNORMAL HIGH (ref 70–99)
POTASSIUM: 3.2 mmol/L — AB (ref 3.5–5.1)
Sodium: 146 mmol/L — ABNORMAL HIGH (ref 135–145)

## 2014-12-17 NOTE — Progress Notes (Signed)
       Patient Name: EMERIC NOVINGER Date of Encounter: 12/17/2014    SUBJECTIVE:He is confused. He denies chest pain.  TELEMETRY:  A fib with moderate rate control.: Filed Vitals:   12/17/14 0020 12/17/14 0400 12/17/14 0635 12/17/14 0731  BP: 92/42 90/47 107/51 102/58  Pulse: 101 105 106 110  Temp: 98.1 F (36.7 C) 99.9 F (37.7 C)  98.2 F (36.8 C)  TempSrc: Oral Axillary  Axillary  Resp: 24 24 17 23   Height:      Weight:  185 lb 6.5 oz (84.1 kg)    SpO2: 97% 92% 97% 98%    Intake/Output Summary (Last 24 hours) at 12/17/14 1001 Last data filed at 12/17/14 0926  Gross per 24 hour  Intake    750 ml  Output   1575 ml  Net   -825 ml   LABS: Basic Metabolic Panel:  Recent Labs  12/15/14 0550 12/16/14 0500  NA 149* 145  K 4.1 3.3*  CL 112 107  CO2 28 25  GLUCOSE 111* 246*  BUN 101* 94*  CREATININE 4.42* 3.97*  CALCIUM 8.5 8.1*  MG 1.9  --    CBC:  Recent Labs  12/14/14 1930 12/16/14 0500  WBC 9.9 8.0  NEUTROABS 8.2*  --   HGB 7.9* 10.0*  HCT 25.3* 31.1*  MCV 87.8 86.6  PLT 141* 130*     Radiology/Studies:  No new data  Physical Exam: Blood pressure 102/58, pulse 110, temperature 98.2 F (36.8 C), temperature source Axillary, resp. rate 23, height 5\' 5"  (1.651 m), weight 185 lb 6.5 oz (84.1 kg), SpO2 98 %. Weight change: -5 lb 11.7 oz (-2.6 kg)  Wt Readings from Last 3 Encounters:  12/17/14 185 lb 6.5 oz (84.1 kg)  10/23/13 168 lb 14 oz (76.6 kg)  07/25/13 162 lb (73.483 kg)    Positive rhonchi IIRR Still edema presacral.  ASSESSMENT:  1. Acute on chronic diastolic heart failure. 2. Acute on chronic kidney failure, with no labs yet today.  Plan:  Still needs diuresis but will wait on todays labs before order.  Demetrios Isaacs 12/17/2014, 10:01 AM

## 2014-12-17 NOTE — Discharge Instructions (Signed)
Can get left hip incision wet in the shower. New dry dressing daily. Full weight-bearing as tolerated. No hip precautions.

## 2014-12-17 NOTE — Clinical Social Work Placement (Signed)
Clinical Social Work Department CLINICAL SOCIAL WORK PLACEMENT NOTE 12/17/2014  Patient:  [name] [name], [name] [name]  Account Number:  [id number] Admit date:  12/08/2014  Clinical Social Worker:  Domenica Reamer, CLINICAL SOCIAL WORKER  Date/time:  12/16/2014 03:00 PM  Clinical Social Work is seeking post-discharge placement for this patient at the following level of care:   SKILLED NURSING   (*CSW will update this form in Epic as items are completed)   12/16/2014  Patient/family provided with [place] Department of Clinical Social Work's list of facilities offering this level of care within the geographic area requested by the patient (or if unable, by the patient's family).  12/16/2014  Patient/family informed of their freedom to choose among providers that offer the needed level of care, that participate in Medicare, Medicaid or managed care program needed by the patient, have an available bed and are willing to accept the patient.  12/16/2014  Patient/family informed of [facility]' ownership interest in [facility], as well as of the fact that they are under no obligation to receive care at this facility.  PASARR submitted to EDS on 12/16/2014 PASARR number received on 12/16/2014  FL2 transmitted to all facilities in geographic area requested by pt/family on  12/16/2014 FL2 transmitted to all facilities within larger geographic area on   Patient informed that his/her managed care company has contracts with or will negotiate with  certain facilities, including the following:     Patient/family informed of bed offers received:   Patient chooses bed at  Physician recommends and patient chooses bed at    Patient to be transferred to  on   Patient to be transferred to facility by  Patient and family notified of transfer on  Name of family member notified:    The following physician request were entered in Epic:   Additional Comments: Domenica Reamer, [place] Social  Worker [phone]

## 2014-12-17 NOTE — Clinical Social Work Note (Addendum)
12:00pm Patients family chooses [facility]- facility aware  10:20am [facility] SN[facility] (pt family first choice) is able to extend a bed offer- CSW attempted to contact family to inform of bed availability- left message.  CSW will continue to follow.  Domenica Reamer, [place] Social Worker [phone]

## 2014-12-17 NOTE — Clinical Social Work Psychosocial (Signed)
Clinical Social Work Department BRIEF PSYCHOSOCIAL ASSESSMENT 12/17/2014  Patient:  CARLISLE, ENKE     Account Number:  [id number]     Admit date:  12/08/2014  Clinical Social Worker:  Domenica Reamer, [place]  Date/Time:  12/16/2014 03:00 PM  Referred by:  Physician  Date Referred:  12/16/2014 Referred for  SNF Placement   Other Referral:   Interview type:  Family Other interview type:    PSYCHOSOCIAL DATA Living Status:  ALONE Admitted from facility:   Level of care:   Primary support name:  Bethena Roys Primary support relationship to patient:  CHILD, ADULT Degree of support available:   high level of support- patient had daughter carolyn at bedside with several other friends    CURRENT CONCERNS Current Concerns  Post-Acute Placement   Other Concerns:    SOCIAL WORK ASSESSMENT / PLAN CSW spoke with patients family concerning SNF placement- patients family is agreeable to SNF and understand that patient is now too high a level of care to be transitioned back home with home health services.  Patients family are very hopeful that patient will recover with rehab services- per dr note patient might need to be palliative so there might be some unrealistic expectations for the patients recovery.   Assessment/plan status:  Psychosocial Support/Ongoing Assessment of Needs Other assessment/ plan:   FL2   Information/referral to community resources:    PATIENT'S/FAMILY'S RESPONSE TO PLAN OF CARE: Patients family are hopeful that patient can go to [facility] for rehab and are hopeful for patient to be able to return home once his rehab is complete.       Domenica Reamer, [place] Social Worker [phone]

## 2014-12-17 NOTE — Progress Notes (Signed)
[place] TEAM 1 - Stepdown/ICU TEAM Progress Note  [name] [name] IRS:[id number] DOB: 1921/02/15 DOA: 12/08/2014 PCP: [name], MD  Admit HPI / Brief Narrative: [name] [name] is a 81 y.o. male, who lives at home and has home health aides assist him. PMHx chronic respiratory failure on home O2,  moderate aortic stenosis with severe pulmonary hypertension and RV dilatation chronic diastolic heart failure, atrial fibrillation not on anticoagulation secondary to GI bleed, S/P CABG 1996 prostate cancer, S/P surgery + XRT, chronic kidney disease, After taking a bath today and while attempting to put on his clothes, he slipped and fell and was complaining of left hip pain. Upon presentation to the ER he was found to have an acute subcapital fracture of the left hip. Orthopedic surgery has been consulted by the EDP. Patient has a history of skipping diuretic dosages because of chronic urinary dribbling/incontinence. Recently he has developed progression of respiratory symptoms and is now requiring oxygen at home. His chest x-ray in the ER revealed mild interstitial edema and associated small bilateral pleural effusions. He is requiring oxygen in the emergency department.    HPI/Subjective: 2/17 patient is sitting up in bed, coughing, having breakfast this morning. He complains of shortness of breath. Noted to be mildly hypotensive on monitor. No chest pain or palpitations.   Assessment/Plan: Closed fracture of left hip  -Per surgery can be up with therapy with full weight bearing and no hip precautions.  Acute encephalopathy  -Most likely multifactorial to include electrolyte abnormalities, acute on chronic respiratory failure, acute on chronic renal failure poorly controlled atrial fib resulting in hypotension -Encephalopathy starting to clear, however patient still A/O 1  -Neuro check q 4hr  Hyponatremia -Resolved -See acute encephalopathy  Acute on chronic hypoxemic respiratory  failure -Multifactorial to include Moderate aortic stenosis,severe pulmonary hypertension, and /RV dilatation  Atrial fibrillation with RVR -DC Cardizem drip, convert to Cardizem  90 mg BID -Continue Lopressor PRN -Not on anticoagulation by: High fall risk  Acute on Chronic diastolic heart failure, NYHA class 1 -Unkown Base Weight -Current heart exacerbation seems driven by patient noncompliance with diuretics at home -BNP 800 -Continue Foley catheter and begin Lasix 60 mg IV daily -Continue supportive care with oxygen maintain SPO2>93% -Transfuse for hemoglobin<8 -2/14 Transfuse 2 units PRBC, Lasix 20 units to be administered between units -2/15 transfuse 1 unit PRBC  -Cardiology following, appreciate assitance -He still has evidence of volume overload, continue diuresis.  Pulmonary HTN -See Acute  On Chronic Diastolic CHF   HTN  -Patient BP remain soft most likely secondary to poor cardiac function, poorly controlled atrial fibrillation, acute on chronic renal failure  -Gently diuresis; Lasix 60 mg daily   CAD/CABG 1996  Acute on Chronic Renal failure(Baseline ~2.03) -2/14 Patient's Cr creatinine has continued to trend up since admission. Consult nephrology if no improvement of renal function with Blood products and gentle diuresis -Strict in and out; since admission -5.5 L -Strict daily weight admission bed weight= 90.4 kg                      2/17 bed weight= 84.1 kg -2/14 Patient fluid overloaded  Lasix 60 mg x 1  -Would start scheduled gentle diuresis in the Am 2/17 Creatinine is gradually trending down. Continue current treatments.  History of prostatic problems and urinary incontinence -Check urinalysis and culture  Anemia -Baseline hemoglobin around 9.1 -See acute on chronic diastolic heart failure  Dyslipidemia -No medications prior to admission  Hypothyroidism -Continue Synthroid -2/8 TSH; within normal limit     Code Status: FULL Family  Communication: Family present   Disposition Plan: SNF? Hospice?    Consultants: Dr. Mcarthur Rossetti (orthopedic surgery) Dr. Peter M Martinique (Cardiology) Dr.David B Simonds (PCCM)   Procedure/Significant Events: 2/10 echocardiogram;Left ventricle: moderate concentric hypertrophy. LVEF= 50% to 55%.  - Aortic valve: moderate stenosis.  - Mitral valve: moderate regurgitation.  - Left/Right atrium: severely dilated. - Right ventricle: moderately dilated.  -Tricuspid valve:moderate-severe regurgitation. - Pulmonary arteries:PA peak pressure: 55 mm Hg (S).   Culture NA  Antibiotics: None  DVT prophylaxis: SCD   Devices   LINES / TUBES:      Continuous Infusions: . dextrose 30 mL/hr at 12/16/14 2143    Objective: VITAL SIGNS: Temp: 98.7 F (37.1 C) (02/17 1145) Temp Source: Oral (02/17 1145) BP: 113/56 mmHg (02/17 1149) Pulse Rate: 98 (02/17 1145) SPO2; FIO2:   Intake/Output Summary (Last 24 hours) at 12/17/14 1431 Last data filed at 12/17/14 1427  Gross per 24 hour  Intake    870 ml  Output   1726 ml  Net   -856 ml     Exam: General: A/O 1 (does not know where, when, why), awake alert follows all commands no respiratory distress  Lungs: diffuse bilateral rhonchi, decreased bibasilar breath sounds, negative crackles Cardiovascular: Irregular irregular rhythm and rate, without murmur gallop or rub normal S1 and S2 Abdomen: Nontender, nondistended, soft, bowel sounds positive, no rebound, no ascites, no appreciable mass Extremities: No significant cyanosis, clubbing. Bilateral pedal edema 1+ ,    Data Reviewed: Basic Metabolic Panel:  Recent Labs Lab 12/13/14 0400 12/14/14 0415 12/14/14 0910 12/15/14 0550 12/16/14 0500 12/17/14 1055  NA 147* 149*  --  149* 145 146*  K 3.9 4.0  --  4.1 3.3* 3.2*  CL 110 111  --  112 107 109  CO2 27 25  --  28 25 31   GLUCOSE 102* 79  --  111* 246* 239*  BUN 88* 94*  --  101* 94* 93*  CREATININE 4.35*  4.52*  --  4.42* 3.97* 3.74*  CALCIUM 8.4 8.4  --  8.5 8.1* 8.1*  MG  --   --  1.8 1.9  --   --    Liver Function Tests:  Recent Labs Lab 12/11/14 0400 12/14/14 1930 12/15/14 0550 12/16/14 0500  AST 95*  --  40* 31  ALT 79*  --  57* 45  ALKPHOS 72  --  77 72  BILITOT 0.8  --  1.7* 1.5*  PROT 5.5*  --  6.3 6.1  ALBUMIN 2.1* 2.1* 1.9* 1.9*   No results for input(s): LIPASE, AMYLASE in the last 168 hours. No results for input(s): AMMONIA in the last 168 hours. CBC:  Recent Labs Lab 12/11/14 0400 12/12/14 0400 12/14/14 0910 12/14/14 1930 12/16/14 0500 12/17/14 1055  WBC 9.7 7.7 10.2 9.9 8.0 9.3  NEUTROABS 8.2*  --  8.8* 8.2*  --   --   HGB 8.4* 8.1* 7.5* 7.9* 10.0* 9.9*  HCT 26.1* 24.8* 23.9* 25.3* 31.1* 31.8*  MCV 87.3 85.5 88.5 87.8 86.6 87.6  PLT 127* 118* 127* 141* 130* 154   Cardiac Enzymes: No results for input(s): CKTOTAL, CKMB, CKMBINDEX, TROPONINI in the last 168 hours. BNP (last 3 results)  Recent Labs  12/08/14 1211  BNP 800.1*    ProBNP (last 3 results) No results for input(s): PROBNP in the last 8760 hours.  CBG:  Recent Labs  Lab 12/11/14 0333 12/11/14 0755 12/11/14 1127 12/11/14 1549 12/11/14 1914  GLUCAP 110* 114* 136* 136* 123*    Recent Results (from the past 240 hour(s))  Urine culture     Status: None   Collection Time: 12/09/14  1:10 AM  Result Value Ref Range Status   Specimen Description URINE, RANDOM  Final   Special Requests NONE  Final   Colony Count   Final    >=100,000 COLONIES/ML Performed at [facility]    Culture   Final    ESCHERICHIA COLI Performed at [facility]    Report Status 12/11/2014 FINAL  Final   Organism ID, Bacteria ESCHERICHIA COLI  Final      Susceptibility   Escherichia coli - MIC*    AMPICILLIN 4 SENSITIVE Sensitive     CEFAZOLIN <=4 SENSITIVE Sensitive     CEFTRIAXONE <=1 SENSITIVE Sensitive     CIPROFLOXACIN >=4 RESISTANT Resistant     GENTAMICIN <=1 SENSITIVE Sensitive      LEVOFLOXACIN >=8 RESISTANT Resistant     NITROFURANTOIN <=16 SENSITIVE Sensitive     TOBRAMYCIN <=1 SENSITIVE Sensitive     TRIMETH/SULFA <=20 SENSITIVE Sensitive     PIP/TAZO <=4 SENSITIVE Sensitive     * ESCHERICHIA COLI  MRSA PCR Screening     Status: Abnormal   Collection Time: 12/09/14 11:42 PM  Result Value Ref Range Status   MRSA by PCR POSITIVE (A) NEGATIVE Final    Comment:        The GeneXpert MRSA Assay (FDA approved for NASAL specimens only), is one component of a comprehensive MRSA colonization surveillance program. It is not intended to diagnose MRSA infection nor to guide or monitor treatment for MRSA infections. RESULT CALLED TO, READ BACK BY AND VERIFIED WITH: CALLED TO RN Emerson Monte [id number] @[phone]  THANEY   Urine culture     Status: None   Collection Time: 12/10/14 12:55 PM  Result Value Ref Range Status   Specimen Description URINE, CATHETERIZED  Final   Special Requests NONE  Final   Colony Count   Final    >=100,000 COLONIES/ML Performed at [facility]    Culture   Final    ESCHERICHIA COLI Performed at [facility]    Report Status 12/12/2014 FINAL  Final   Organism ID, Bacteria ESCHERICHIA COLI  Final      Susceptibility   Escherichia coli - MIC*    AMPICILLIN 4 SENSITIVE Sensitive     CEFAZOLIN <=4 SENSITIVE Sensitive     CEFTRIAXONE <=1 SENSITIVE Sensitive     CIPROFLOXACIN >=4 RESISTANT Resistant     GENTAMICIN <=1 SENSITIVE Sensitive     LEVOFLOXACIN >=8 RESISTANT Resistant     NITROFURANTOIN <=16 SENSITIVE Sensitive     TOBRAMYCIN <=1 SENSITIVE Sensitive     TRIMETH/SULFA <=20 SENSITIVE Sensitive     PIP/TAZO <=4 SENSITIVE Sensitive     * ESCHERICHIA COLI  Clostridium Difficile by PCR     Status: None   Collection Time: 12/12/14  4:14 PM  Result Value Ref Range Status   C difficile by pcr NEGATIVE NEGATIVE Final     Studies:  Recent x-ray studies have been reviewed in detail by the Attending  Physician  Scheduled Meds:  Scheduled Meds: . antiseptic oral rinse  7 mL Mouth Rinse q12n4p  . chlorhexidine  15 mL Mouth Rinse BID  . diltiazem  90 mg Oral Q12H  . feeding supplement (PRO-STAT SUGAR FREE 64)  30 mL Oral  BID WC  . furosemide  60 mg Intravenous Daily  . heparin  5,000 Units Subcutaneous 3 times per day  . levothyroxine  75 mcg Oral QAC breakfast  . LORazepam  0.5 mg Intravenous Once  . multivitamin with minerals  1 tablet Oral Daily    Time spent on care of this patient: 40 mins   [facility] Office  [phone] Pager - [phone]  On-Call/Text Page:      [email or web address]      password [email or web address]  If 7PM-7AM, please contact night-coverage [email or web address] Password [email or web address] 12/17/2014, 2:31 PM   LOS: 9 days

## 2014-12-18 ENCOUNTER — Inpatient Hospital Stay (HOSPITAL_COMMUNITY): Payer: Medicare Other

## 2014-12-18 DIAGNOSIS — G9340 Encephalopathy, unspecified: Secondary | ICD-10-CM | POA: Insufficient documentation

## 2014-12-18 DIAGNOSIS — E870 Hyperosmolality and hypernatremia: Secondary | ICD-10-CM

## 2014-12-18 DIAGNOSIS — I5033 Acute on chronic diastolic (congestive) heart failure: Secondary | ICD-10-CM | POA: Insufficient documentation

## 2014-12-18 DIAGNOSIS — E785 Hyperlipidemia, unspecified: Secondary | ICD-10-CM | POA: Insufficient documentation

## 2014-12-18 DIAGNOSIS — E876 Hypokalemia: Secondary | ICD-10-CM | POA: Insufficient documentation

## 2014-12-18 LAB — CBC
HEMATOCRIT: 27.6 % — AB (ref 39.0–52.0)
HEMOGLOBIN: 8.7 g/dL — AB (ref 13.0–17.0)
MCH: 28.2 pg (ref 26.0–34.0)
MCHC: 31.5 g/dL (ref 30.0–36.0)
MCV: 89.3 fL (ref 78.0–100.0)
Platelets: 165 10*3/uL (ref 150–400)
RBC: 3.09 MIL/uL — ABNORMAL LOW (ref 4.22–5.81)
RDW: 17.4 % — ABNORMAL HIGH (ref 11.5–15.5)
WBC: 10.2 10*3/uL (ref 4.0–10.5)

## 2014-12-18 LAB — BASIC METABOLIC PANEL
Anion gap: 5 (ref 5–15)
BUN: 94 mg/dL — ABNORMAL HIGH (ref 6–23)
CO2: 35 mmol/L — ABNORMAL HIGH (ref 19–32)
Calcium: 8.3 mg/dL — ABNORMAL LOW (ref 8.4–10.5)
Chloride: 110 mmol/L (ref 96–112)
Creatinine, Ser: 3.38 mg/dL — ABNORMAL HIGH (ref 0.50–1.35)
GFR calc Af Amer: 17 mL/min — ABNORMAL LOW (ref >90)
GFR calc non Af Amer: 15 mL/min — ABNORMAL LOW (ref >90)
Glucose, Bld: 121 mg/dL — ABNORMAL HIGH (ref 70–99)
POTASSIUM: 3 mmol/L — AB (ref 3.5–5.1)
Sodium: 150 mmol/L — ABNORMAL HIGH (ref 135–145)

## 2014-12-18 LAB — POTASSIUM: Potassium: 3.6 mmol/L (ref 3.5–5.1)

## 2014-12-18 LAB — MAGNESIUM: Magnesium: 1.7 mg/dL (ref 1.5–2.5)

## 2014-12-18 MED ORDER — DILTIAZEM HCL ER 60 MG PO CP12
120.0000 mg | ORAL_CAPSULE | Freq: Two times a day (BID) | ORAL | Status: DC
  Administered 2014-12-18 – 2014-12-22 (×8): 120 mg via ORAL
  Filled 2014-12-18 (×9): qty 2

## 2014-12-18 MED ORDER — RESOURCE THICKENUP CLEAR PO POWD
ORAL | Status: DC | PRN
Start: 2014-12-18 — End: 2014-12-22
  Filled 2014-12-18 (×2): qty 125

## 2014-12-18 MED ORDER — FREE WATER
100.0000 mL | Status: DC
  Administered 2014-12-18 – 2014-12-19 (×5): 100 mL

## 2014-12-18 MED ORDER — STARCH (THICKENING) PO POWD
ORAL | Status: DC | PRN
  Filled 2014-12-18: qty 227

## 2014-12-18 MED ORDER — DIGOXIN 250 MCG PO TABS
0.2500 mg | ORAL_TABLET | Freq: Every day | ORAL | Status: AC
  Administered 2014-12-18 – 2014-12-19 (×2): 0.25 mg via ORAL
  Filled 2014-12-18 (×2): qty 1

## 2014-12-18 MED ORDER — DIGOXIN 0.0625 MG HALF TABLET
0.0625 mg | ORAL_TABLET | ORAL | Status: DC
  Administered 2014-12-20 – 2014-12-22 (×2): 0.0625 mg via ORAL
  Filled 2014-12-18 (×2): qty 1

## 2014-12-18 MED ORDER — POTASSIUM CHLORIDE 10 MEQ/100ML IV SOLN
10.0000 meq | INTRAVENOUS | Status: AC
  Administered 2014-12-18 (×3): 10 meq via INTRAVENOUS
  Filled 2014-12-18: qty 100

## 2014-12-18 NOTE — Progress Notes (Signed)
Gastric tube placed without difficulty. Two nurses verified placement. Patient tolerated procedure.

## 2014-12-18 NOTE — Progress Notes (Signed)
Speech Language Pathology Treatment: Dysphagia  Patient Details Name: [name] [name] MRN: [id number] DOB: 1921-08-15 Today's Date: 12/18/2014 Time: 5170-0174 SLP Time Calculation (min) (ACUTE ONLY): 22 min  Assessment / Plan / Recommendation Clinical Impression  Goal of session was to determine pt tolerance of dys 1/ nectar thick liquid diet. Pt was found to be more alert than in prior session.  Pt coughed intermittently following po trials following teaspoon sips of nectar thick liquids and puree. Cough was noted at baseline, however,  cough has increased from baseline, and pt intermittently coughs immediately following po's of both nectar thick and puree consistencies. Increased cough is concern for aspiration. Recommend an objective study of swallowing function to determine if pt is tolerating current diet of only teaspoon amounts and if patient can upgrade. Recommend pt continue on current diet. Speech will follow-up with objective swallow study findings.    HPI HPI: 81 year old male admitted 12/08/14 after fall at home, sustaining hip fracture. Orders received to evaluate for po readiness   Pertinent Vitals    SLP Plan  Continue with current plan of care    Recommendations Diet recommendations: Nectar-thick liquid;Dysphagia 1 (puree) Liquids provided via: Teaspoon Medication Administration: Crushed with puree Compensations: Slow rate;Small sips/bites Postural Changes and/or Swallow Maneuvers: Seated upright 90 degrees              Oral Care Recommendations: Oral care Q4 per protocol Follow up Recommendations: Skilled Nursing facility Plan: Continue with current plan of care    GO     Bermudez-Bosch, Kandas Oliveto 12/18/2014, 9:19 AM

## 2014-12-18 NOTE — Progress Notes (Signed)
       Patient Name: [name] [name] Date of Encounter: 12/18/2014    SUBJECTIVE:Confused with no good history.  TELEMETRY:  A fib with poor rate control. Filed Vitals:   12/17/14 1642 12/17/14 2000 12/18/14 0000 12/18/14 0500  BP: 112/59 95/70    Pulse: 92 113    Temp: 98.3 F (36.8 C) 98.5 F (36.9 C) 98.2 F (36.8 C)   TempSrc: Oral Oral Axillary   Resp: 21 21    Height:      Weight:    185 lb 10 oz (84.2 kg)  SpO2: 94% 96%      Intake/Output Summary (Last 24 hours) at 12/18/14 0828 Last data filed at 12/18/14 0000  Gross per 24 hour  Intake    660 ml  Output    451 ml  Net    209 ml   LABS: Basic Metabolic Panel:  Recent Labs  12/17/14 1055 12/18/14 0500  NA 146* 150*  K 3.2* 3.0*  CL 109 110  CO2 31 35*  GLUCOSE 239* 121*  BUN 93* 94*  CREATININE 3.74* 3.38*  CALCIUM 8.1* 8.3*   CBC:  Recent Labs  12/17/14 1055 12/18/14 0500  WBC 9.3 10.2  HGB 9.9* 8.7*  HCT 31.8* 27.6*  MCV 87.6 89.3  PLT 154 165   Radiology/Studies:  No new data  Physical Exam: Blood pressure 95/70, pulse 113, temperature 98.2 F (36.8 C), temperature source Axillary, resp. rate 21, height 5\' 5"  (1.651 m), weight 185 lb 10 oz (84.2 kg), SpO2 96 %. Weight change: 3.5 oz (0.1 kg)  Wt Readings from Last 3 Encounters:  12/18/14 185 lb 10 oz (84.2 kg)  10/23/13 168 lb 14 oz (76.6 kg)  07/25/13 162 lb (73.483 kg)    Still with 3rd space fluid in presacral region  ASSESSMENT:  1. A/C DHF with volume excess 2. Aortic stenosis, severe 3. A/C kidney disease, stable numbers this AM 4. A fib with poor rate control, on oral dilt  Plan:  1. Diuresis as tolerated 2. Poor prognosis 3. Low dose dig to help rate control. Will use .o625 mg M,W, F  Demetrios Isaacs 12/18/2014, 8:28 AM

## 2014-12-18 NOTE — Progress Notes (Signed)
Physical Therapy Treatment Patient Details Name: [name] [name] MRN: [id number] DOB: [birth year] Today's Date: 12/18/2014    History of Present Illness 81 y.o. M who sustained left hip fx on 2/8 after mechanical fall at home. Taken to OR 2/9 for left hip hemiarthroplasty and following OR, returned to ICU on vent. Course complicated by AKI. Was living at home with hospice care.    PT Comments    Patient seen for progression of activity and OOB. Patient tolerated extended time standing this session during hygiene and pericare (pt incontinent of large amount of stool). Patient attempted ambulation but could not weight shift on RLE. Difficulty advancing LE as well. Will continue to see and progress as tolerated. ST SNF remains appropriate discharge venue.   Follow Up Recommendations  SNF     Equipment Recommendations  None recommended by PT    Recommendations for Other Services OT consult     Precautions / Restrictions Precautions Precautions: None (per ortho MD, no hip precautions) Restrictions Weight Bearing Restrictions: Yes LLE Weight Bearing: Weight bearing as tolerated    Mobility  Bed Mobility Overal bed mobility: Needs Assistance Bed Mobility: Supine to Sit     Supine to sit: Mod assist     General bed mobility comments: Vcs for initiation and seqeuncing, assist to elevate trunk and position EOB, Assist to rotate hips to EOB  Transfers Overall transfer level: Needs assistance Equipment used: Rolling walker (2 wheeled);Ambulation equipment used Transfers: Sit to/from [facility] Sit to Stand: Mod assist;+2 physical assistance Stand pivot transfers: Mod assist;+2 physical assistance       General transfer comment: Assist to elevate to upright, patient able to initiate power up to standing with assist via gait belt and chuck pad, performed x4 secondary to incontinence and need for hygiene and pericare  Ambulation/Gait                  Stairs            Wheelchair Mobility    Modified Rankin (Stroke Patients Only)       Balance Overall balance assessment: Needs assistance Sitting-balance support: Bilateral upper extremity supported Sitting balance-Leahy Scale: Fair Sitting balance - Comments: patient tolerated EOB sitting without physical assist for several moments.   Standing balance support: Bilateral upper extremity supported;During functional activity Standing balance-Leahy Scale: Poor Standing balance comment: bilateral UE support on RW, increased physical assist required to maintain static standing. Patient with tolerated static standing for increased time with assist >5 minutes for hygiene purposes, max multimodal cues to faciliate upright posture                    Cognition Arousal/Alertness: Awake/alert Behavior During Therapy: WFL for tasks assessed/performed Overall Cognitive Status: Impaired/Different from baseline Area of Impairment: Following commands;Problem solving;Memory     Memory: Decreased short-term memory Following Commands: Follows one step commands with increased time     Problem Solving: Slow processing;Requires verbal cues;Requires tactile cues      Exercises General Exercises - Lower Extremity Ankle Circles/Pumps: AROM;Both;20 reps;Supine (VCs to attend to task)    General Comments General comments (skin integrity, edema, etc.): HR elevated to 117 with activity, BP and SpO2 stable on 3 liters      Pertinent Vitals/Pain Pain Assessment: No/denies pain    Home Living                      Prior Function  PT Goals (current goals can now be found in the care plan section) Acute Rehab PT Goals Patient Stated Goal: none stated PT Goal Formulation: With family Time For Goal Achievement: 12/27/14 Potential to Achieve Goals: Fair Progress towards PT goals: Progressing toward goals    Frequency  Min 3X/week    PT Plan Current plan  remains appropriate    Co-evaluation             End of Session Equipment Utilized During Treatment: Gait belt;Oxygen Activity Tolerance: Patient limited by fatigue Patient left: in chair;with call bell/phone within reach;with nursing/sitter in room     Time: 0939-1008 PT Time Calculation (min) (ACUTE ONLY): 29 min  Charges:  $Therapeutic Activity: 23-37 mins                    G CodesDuncan Dull 12-26-14, 1:16 PM Alben Deeds, [place] DPT  [phone]

## 2014-12-18 NOTE — Progress Notes (Signed)
[place] TEAM 1 - Stepdown/ICU TEAM Progress Note  [name] [name] [id number]:[id number] DOB: October 29, 1921 DOA: 12/08/2014 PCP: [name], MD  Admit HPI / Brief Narrative: [name] [name] is a 81 y.o. male, who lives at home and has home health aides assist him. PMHx chronic respiratory failure on home O2,  moderate aortic stenosis with severe pulmonary hypertension and RV dilatation chronic diastolic heart failure, atrial fibrillation not on anticoagulation secondary to GI bleed, S/P CABG 1996 prostate cancer, S/P surgery + XRT, chronic kidney disease, After taking a bath today and while attempting to put on his clothes, he slipped and fell and was complaining of left hip pain. Upon presentation to the ER he was found to have an acute subcapital fracture of the left hip. Orthopedic surgery has been consulted by the EDP. Patient has a history of skipping diuretic dosages because of chronic urinary dribbling/incontinence. Recently he has developed progression of respiratory symptoms and is now requiring oxygen at home. His chest x-ray in the ER revealed mild interstitial edema and associated small bilateral pleural effusions. He is requiring oxygen in the emergency department.    HPI/Subjective: 2/18  A/O 1, patient alert, sitting comfortably in Chair. Patient agreed to allow placement of NG tube   Assessment/Plan: Closed fracture of left hip  -Per surgery can be up with therapy with full weight bearing and no hip precautions.  Acute encephalopathy  -Most likely multifactorial to include electrolyte abnormalities, acute on chronic respiratory failure, acute on chronic renal failure poorly controlled atrial fib resulting in hypotension -Encephalopathy continues,patient still A/O 1  -Neuro check q 4hr  Hypernatremia -Worsening since patient self DC'd NG tube  -Replace NG tube -Start free water 125ml q 4hr   -See acute encephalopathy  Acute on chronic hypoxemic respiratory failure -Multifactorial  to include Moderate aortic stenosis,severe pulmonary hypertension, and /RV dilatation  Atrial fibrillation with RVR -increase Cardizem  120 mg BID -continue digoxin 0.25 mg x2 doses, then starting Saturday 2/20 0.0625 mg QOD -Continue Lopressor PRN -Not on anticoagulation by: High fall risk  Acute on Chronic diastolic heart failure, NYHA class 1 -Unkown Base Weight -Current heart exacerbation seems driven by patient noncompliance with diuretics at home -BNP 800 -Continue Foley catheter and continue Lasix 60 mg IV daily -Continue supportive care with oxygen maintain SPO2>93% -Transfuse for hemoglobin<8 -2/14 Transfuse 2 units PRBC, -2/15 transfuse 1 unit PRBC   Pulmonary HTN -See Acute  On Chronic Diastolic CHF   HTN  -Patient BP remain soft most likely secondary to poor cardiac function, poorly controlled atrial fibrillation, acute on chronic renal failure  -Gently diuresis; Lasix 60 mg daily   CAD/CABG 1996  Acute on Chronic Renal failure(Baseline ~2.03) -2/14 Patient's Cr creatinine has continued to trend up since admission. Consult nephrology if no improvement of renal function with Blood products and gentle diuresis -Strict in and out; since admission -5.9 L -Strict daily weight admission bed weight= 90.4 kg                      2/18 bed weight= 84.2 kg  History of prostatic problems and urinary incontinence -Check urinalysis and culture  Anemia -Baseline hemoglobin around 9.1 -See acute on chronic diastolic heart failure  Dyslipidemia -No medications prior to admission  Hypothyroidism -Continue Synthroid -2/8 TSH; within normal limit  Hypokalemia -Potassium IV 9meq x 3 runs -Repeat potassium and magnesium 1400    Code Status: FULL Family Communication: Family present   Disposition Plan: SNF? Hospice?  Consultants: Dr. Mcarthur Rossetti (orthopedic surgery) Dr. Peter M Martinique (Cardiology) Dr.David B Simonds (PCCM)   Procedure/Significant  Events: 2/10 echocardiogram;Left ventricle: moderate concentric hypertrophy. LVEF= 50% to 55%.  - Aortic valve: moderate stenosis.  - Mitral valve: moderate regurgitation.  - Left/Right atrium: severely dilated. - Right ventricle: moderately dilated.  -Tricuspid valve:moderate-severe regurgitation. - Pulmonary arteries:PA peak pressure: 55 mm Hg (S).   Culture NA  Antibiotics: None  DVT prophylaxis: SCD   Devices   LINES / TUBES:      Continuous Infusions: . dextrose 30 mL/hr at 12/18/14 0232    Objective: VITAL SIGNS: Temp: 98.1 F (36.7 C) (02/18 0845) Temp Source: Oral (02/18 0845) BP: 109/52 mmHg (02/18 0845) Pulse Rate: 102 (02/18 0959) SPO2; FIO2:   Intake/Output Summary (Last 24 hours) at 12/18/14 1027 Last data filed at 12/18/14 0849  Gross per 24 hour  Intake    300 ml  Output    801 ml  Net   -501 ml     Exam: General: A/O 1 (does not know where, when, why), awake alert follows all commands no respiratory distress  Lungs: diffuse bilateral rhonchi, mild expiratory wheezing, decreased right base breath sounds, negative crackles Cardiovascular: Irregular irregular rhythm and rate, without murmur gallop or rub normal S1 and S2 Abdomen: Nontender, nondistended, soft, bowel sounds positive, no rebound, no ascites, no appreciable mass Extremities: No significant cyanosis, clubbing. Bilateral pedal edema resolved    Data Reviewed: Basic Metabolic Panel:  Recent Labs Lab 12/14/14 0415 12/14/14 0910 12/15/14 0550 12/16/14 0500 12/17/14 1055 12/18/14 0500  NA 149*  --  149* 145 146* 150*  K 4.0  --  4.1 3.3* 3.2* 3.0*  CL 111  --  112 107 109 110  CO2 25  --  28 25 31  35*  GLUCOSE 79  --  111* 246* 239* 121*  BUN 94*  --  101* 94* 93* 94*  CREATININE 4.52*  --  4.42* 3.97* 3.74* 3.38*  CALCIUM 8.4  --  8.5 8.1* 8.1* 8.3*  MG  --  1.8 1.9  --   --   --    Liver Function Tests:  Recent Labs Lab 12/14/14 1930 12/15/14 0550  12/16/14 0500  AST  --  40* 31  ALT  --  57* 45  ALKPHOS  --  77 72  BILITOT  --  1.7* 1.5*  PROT  --  6.3 6.1  ALBUMIN 2.1* 1.9* 1.9*   No results for input(s): LIPASE, AMYLASE in the last 168 hours. No results for input(s): AMMONIA in the last 168 hours. CBC:  Recent Labs Lab 12/14/14 0910 12/14/14 1930 12/16/14 0500 12/17/14 1055 12/18/14 0500  WBC 10.2 9.9 8.0 9.3 10.2  NEUTROABS 8.8* 8.2*  --   --   --   HGB 7.5* 7.9* 10.0* 9.9* 8.7*  HCT 23.9* 25.3* 31.1* 31.8* 27.6*  MCV 88.5 87.8 86.6 87.6 89.3  PLT 127* 141* 130* 154 165   Cardiac Enzymes: No results for input(s): CKTOTAL, CKMB, CKMBINDEX, TROPONINI in the last 168 hours. BNP (last 3 results)  Recent Labs  12/08/14 1211  BNP 800.1*    ProBNP (last 3 results) No results for input(s): PROBNP in the last 8760 hours.  CBG:  Recent Labs Lab 12/11/14 1127 12/11/14 1549 12/11/14 1914  GLUCAP 136* 136* 123*    Recent Results (from the past 240 hour(s))  Urine culture     Status: None   Collection Time: 12/09/14  1:10 AM  Result Value Ref Range Status   Specimen Description URINE, RANDOM  Final   Special Requests NONE  Final   Colony Count   Final    >=100,000 COLONIES/ML Performed at [facility]    Culture   Final    ESCHERICHIA COLI Performed at [facility]    Report Status 12/11/2014 FINAL  Final   Organism ID, Bacteria ESCHERICHIA COLI  Final      Susceptibility   Escherichia coli - MIC*    AMPICILLIN 4 SENSITIVE Sensitive     CEFAZOLIN <=4 SENSITIVE Sensitive     CEFTRIAXONE <=1 SENSITIVE Sensitive     CIPROFLOXACIN >=4 RESISTANT Resistant     GENTAMICIN <=1 SENSITIVE Sensitive     LEVOFLOXACIN >=8 RESISTANT Resistant     NITROFURANTOIN <=16 SENSITIVE Sensitive     TOBRAMYCIN <=1 SENSITIVE Sensitive     TRIMETH/SULFA <=20 SENSITIVE Sensitive     PIP/TAZO <=4 SENSITIVE Sensitive     * ESCHERICHIA COLI  MRSA PCR Screening     Status: Abnormal   Collection Time:  12/09/14 11:42 PM  Result Value Ref Range Status   MRSA by PCR POSITIVE (A) NEGATIVE Final    Comment:        The GeneXpert MRSA Assay (FDA approved for NASAL specimens only), is one component of a comprehensive MRSA colonization surveillance program. It is not intended to diagnose MRSA infection nor to guide or monitor treatment for MRSA infections. RESULT CALLED TO, READ BACK BY AND VERIFIED WITH: CALLED TO RN Emerson Monte [id number] @0508  THANEY   Urine culture     Status: None   Collection Time: 12/10/14 12:55 PM  Result Value Ref Range Status   Specimen Description URINE, CATHETERIZED  Final   Special Requests NONE  Final   Colony Count   Final    >=100,000 COLONIES/ML Performed at [facility]    Culture   Final    ESCHERICHIA COLI Performed at [facility]    Report Status 12/12/2014 FINAL  Final   Organism ID, Bacteria ESCHERICHIA COLI  Final      Susceptibility   Escherichia coli - MIC*    AMPICILLIN 4 SENSITIVE Sensitive     CEFAZOLIN <=4 SENSITIVE Sensitive     CEFTRIAXONE <=1 SENSITIVE Sensitive     CIPROFLOXACIN >=4 RESISTANT Resistant     GENTAMICIN <=1 SENSITIVE Sensitive     LEVOFLOXACIN >=8 RESISTANT Resistant     NITROFURANTOIN <=16 SENSITIVE Sensitive     TOBRAMYCIN <=1 SENSITIVE Sensitive     TRIMETH/SULFA <=20 SENSITIVE Sensitive     PIP/TAZO <=4 SENSITIVE Sensitive     * ESCHERICHIA COLI  Clostridium Difficile by PCR     Status: None   Collection Time: 12/12/14  4:14 PM  Result Value Ref Range Status   C difficile by pcr NEGATIVE NEGATIVE Final     Studies:  Recent x-ray studies have been reviewed in detail by the Attending Physician  Scheduled Meds:  Scheduled Meds: . antiseptic oral rinse  7 mL Mouth Rinse q12n4p  . chlorhexidine  15 mL Mouth Rinse BID  . [START ON 12/20/2014] digoxin  0.0625 mg Oral QODAY  . digoxin  0.25 mg Oral Daily  . diltiazem  90 mg Oral Q12H  . feeding supplement (PRO-STAT SUGAR FREE 64)  30  mL Oral BID WC  . furosemide  60 mg Intravenous Daily  . heparin  5,000 Units Subcutaneous 3 times per day  . levothyroxine  75 mcg  Oral QAC breakfast  . LORazepam  0.5 mg Intravenous Once  . multivitamin with minerals  1 tablet Oral Daily    Time spent on care of this patient: 40 mins   Allie Bossier [facility]  [facility] Office  [phone] Pager - [phone]  On-Call/Text Page:      [email or web address]      password [email or web address]  If 7PM-7AM, please contact night-coverage [email or web address] Password [email or web address] 12/18/2014, 10:27 AM   LOS: 10 days   Care during the described time interval was provided by me .  I have reviewed this patient's available data, including medical history, events of note, physical examination, radiology studies and test results as part of my evaluation  Dia Crawford, MD [phone] Pager

## 2014-12-18 NOTE — Progress Notes (Signed)
Pt has bright red blood in loose stool, passing information along to day shift nurse.

## 2014-12-19 LAB — URINE MICROSCOPIC-ADD ON

## 2014-12-19 LAB — URINALYSIS, ROUTINE W REFLEX MICROSCOPIC
BILIRUBIN URINE: NEGATIVE
Glucose, UA: NEGATIVE mg/dL
Ketones, ur: NEGATIVE mg/dL
Nitrite: POSITIVE — AB
Protein, ur: 30 mg/dL — AB
Specific Gravity, Urine: 1.011 (ref 1.005–1.030)
UROBILINOGEN UA: 0.2 mg/dL (ref 0.0–1.0)
pH: 5 (ref 5.0–8.0)

## 2014-12-19 LAB — BASIC METABOLIC PANEL
ANION GAP: 7 (ref 5–15)
BUN: 94 mg/dL — ABNORMAL HIGH (ref 6–23)
CALCIUM: 8.3 mg/dL — AB (ref 8.4–10.5)
CO2: 32 mmol/L (ref 19–32)
CREATININE: 3.31 mg/dL — AB (ref 0.50–1.35)
Chloride: 108 mmol/L (ref 96–112)
GFR calc non Af Amer: 15 mL/min — ABNORMAL LOW (ref >90)
GFR, EST AFRICAN AMERICAN: 17 mL/min — AB (ref >90)
Glucose, Bld: 119 mg/dL — ABNORMAL HIGH (ref 70–99)
Potassium: 3.3 mmol/L — ABNORMAL LOW (ref 3.5–5.1)
Sodium: 147 mmol/L — ABNORMAL HIGH (ref 135–145)

## 2014-12-19 LAB — DIGOXIN LEVEL: DIGOXIN LVL: 0.9 ng/mL (ref 0.8–2.0)

## 2014-12-19 MED ORDER — POTASSIUM CHLORIDE CRYS ER 20 MEQ PO TBCR
20.0000 meq | EXTENDED_RELEASE_TABLET | Freq: Every day | ORAL | Status: DC
  Administered 2014-12-19 – 2014-12-21 (×3): 20 meq via ORAL
  Filled 2014-12-19 (×4): qty 1

## 2014-12-19 MED ORDER — FUROSEMIDE 10 MG/ML IJ SOLN: 40.0000 mg | Freq: Every day | INTRAMUSCULAR | Status: DC

## 2014-12-19 MED ORDER — FUROSEMIDE 20 MG PO TABS
20.0000 mg | ORAL_TABLET | Freq: Every day | ORAL | Status: DC
  Administered 2014-12-20: 20 mg via ORAL
  Filled 2014-12-19: qty 1

## 2014-12-19 MED ORDER — FUROSEMIDE 10 MG/ML IJ SOLN: 20.0000 mg | Freq: Every day | INTRAMUSCULAR | Status: DC

## 2014-12-19 NOTE — Progress Notes (Signed)
NP Baltazar Najjar advised to leave out Ng tube for now, pts NA down to 147 this am with stat labs drawn. Will pass along to day shift.

## 2014-12-19 NOTE — Progress Notes (Signed)
[name] [name] - Stepdown/ICU TEAM Progress Note  [name] [name] YKZ:[id number] DOB: July 06, 1921 DOA: 12/08/2014 PCP: [name] Reel, MD  Admit HPI / Brief Narrative: 81 y.o. M who lives at home and has home health aides assist him. Hx chronic respiratory failure on home O2,  moderate aortic stenosis with severe pulmonary hypertension and RV dilatation, chronic diastolic heart failure, atrial fibrillation not on anticoagulation secondary to GI bleed, S/P CABG 1996, prostate cancer S/P surgery + XRT, and chronic kidney disease who was taking a bath and while attempting to put on his clothes, slipped and fell and developed left hip pain.   Upon presentation to the ER he was found to have an acute subcapital fracture of the left hip. His chest x-ray revealed mild interstitial edema and associated small bilateral pleural effusions.   HPI/Subjective: Pt is up in bedside chair with no complaints at this time.  He denies cp, n/v, or adom pain.  He is somewhat confused but not agitated.    Assessment/Plan:  Closed fracture of left hip  -s/p L hip hemiarthroplasty - Ortho following - full weight bearing and no hip precautions per Ortho - for d/c to SNF for rehab   Acute encephalopathy  -Most likely multifactorial to include hypernatremia, acute on chronic respiratory failure, acute on chronic renal failure w/ severe uremia, poorly controlled atrial fib, and hypotension -cleared by SLP for Pureed diet w/ nectar thick liquids  E coli UTI Pt was given 3 doses of cipro - I fear this would not be adequate in a male w/ a complex medical condition - recheck UA  Hypernatremia  -has been receiving free water via NG tube - pt pulled his NG last night - Na improving - push oral intake and recheck in AM   Acute on chronic hypoxemic respiratory failure -Multifactorial to include moderate aortic stenosis, severe pulmonary hypertension, and RV dilatation - appears stable at present - wean to RA as able   Atrial  fibrillation with RVR -Cardizem120 mg BID -continue digoxin 0.25 mg x2 doses, then starting Saturday 2/20 0.0625 mg QOD -Continue Lopressor PRN -Not on anticoagulation due to high fall risk  Acute on Chronic diastolic heart failure, NYHA class [name] -Unknown Base Weight - weight at admit 90.4kg - current weight 84.7kg -Transfuse for hemoglobin<8 - Transfused 3 units PRBC thus far -slow diuresis and follow    Pulmonary HTN -See Acute  On Chronic Diastolic CHF   HTN  -Patient currently modestly hypotensive most likely secondary to poor cardiac function, poorly controlled atrial fibrillation, acute on chronic renal failure  -Gently diuresing   CAD/CABG 1996  Acute on Chronic Renal failure (Baseline ~2.03) -Patient's Cr has begun to slowly improve   History of prostatic problems and urinary incontinence  Anemia -Baseline hemoglobin around 9.[name] -s/p 3U PRBC thus far   Dyslipidemia -No medications prior to admission  Hypothyroidism -Continue Synthroid -TSH within normal limits  Hypokalemia -cont to replace and follow   Code Status: FULL Family Communication:   Disposition Plan:   Consultants: Dr. Mcarthur Rossetti (Orthopedic surgery) Dr. Peter M Martinique (Cardiology) Dr.David B Simonds (PCCM)  Procedure/Significant Events: 2/10 echocardiogram;Left ventricle: moderate concentric hypertrophy. LVEF= 50% to 55%.  - Aortic valve: moderate stenosis.  - Mitral valve: moderate regurgitation.  - Left/Right atrium: severely dilated. - Right ventricle: moderately dilated.  -Tricuspid valve:moderate-severe regurgitation. - Pulmonary arteries:PA peak pressure: 55 mm Hg (S).  Antibiotics: None  DVT prophylaxis: SCD  Objective: Blood pressure 94/44, pulse 89, temperature  63 F (36.7 C), temperature source Oral, resp. rate 23, height 5\' 5"  ([name].651 m), weight 84.7 kg (186 lb 11.7 oz), SpO2 99 %.  Intake/Output Summary (Last 24 hours) at 12/19/14 1748 Last data filed at  12/19/14 1730  Gross per 24 hour  Intake   1100 ml  Output   1725 ml  Net   -625 ml   Exam: General: No acute respiratory distress Lungs: Clear to auscultation bilaterally without wheezes or crackles Cardiovascular: Regular rate without murmur gallop or rub normal S1 and S2 Abdomen: Nontender, nondistended, soft, bowel sounds positive, no rebound, no ascites, no appreciable mass Extremities: No significant cyanosis, clubbing, or edema bilateral lower extremities  Data Reviewed: Basic Metabolic Panel:  Recent Labs Lab 12/14/14 0910 12/15/14 0550 12/16/14 0500 12/17/14 1055 12/18/14 0500 12/18/14 1644 12/19/14 0500  NA  --  149* 145 146* 150*  --  147*  K  --  4.[name] 3.3* 3.2* 3.0* 3.6 3.3*  CL  --  112 107 109 110  --  108  CO2  --  28 25 31  35*  --  32  GLUCOSE  --  111* 246* 239* 121*  --  119*  BUN  --  101* 94* 93* 94*  --  94*  CREATININE  --  4.42* 3.97* 3.74* 3.38*  --  3.31*  CALCIUM  --  8.5 8.[name]* 8.[name]* 8.3*  --  8.3*  MG [name].8 [name].9  --   --   --  [name].7  --    Liver Function Tests:  Recent Labs Lab 12/14/14 1930 12/15/14 0550 12/16/14 0500  AST  --  40* 31  ALT  --  57* 45  ALKPHOS  --  77 72  BILITOT  --  [name].7* [name].5*  PROT  --  6.3 6.[name]  ALBUMIN 2.[name]* [name].9* [name].9*   CBC:  Recent Labs Lab 12/14/14 0910 12/14/14 1930 12/16/14 0500 12/17/14 1055 12/18/14 0500  WBC 10.2 9.9 8.0 9.3 10.2  NEUTROABS 8.8* 8.2*  --   --   --   HGB 7.5* 7.9* 10.0* 9.9* 8.7*  HCT 23.9* 25.3* 31.[name]* 31.8* 27.6*  MCV 88.5 87.8 86.6 87.6 89.3  PLT 127* 141* 130* 154 165    Recent Results (from the past 240 hour(s))  MRSA PCR Screening     Status: Abnormal   Collection Time: 12/09/14 11:42 PM  Result Value Ref Range Status   MRSA by PCR POSITIVE (A) NEGATIVE Final    Comment:        The GeneXpert MRSA Assay (FDA approved for NASAL specimens only), is one component of a comprehensive MRSA colonization surveillance program. It is not intended to diagnose MRSA infection nor to  guide or monitor treatment for MRSA infections. RESULT CALLED TO, READ BACK BY AND VERIFIED WITH: CALLED TO RN Emerson Monte [id number] @0508  THANEY   Urine culture     Status: None   Collection Time: 12/10/14 12:55 PM  Result Value Ref Range Status   Specimen Description URINE, CATHETERIZED  Final   Special Requests NONE  Final   Colony Count   Final    >=100,000 COLONIES/ML Performed at [facility]    Culture   Final    ESCHERICHIA COLI Performed at [facility]    Report Status 12/12/2014 FINAL  Final   Organism ID, Bacteria ESCHERICHIA COLI  Final      Susceptibility   Escherichia coli - MIC*    AMPICILLIN 4 SENSITIVE Sensitive  CEFAZOLIN <=4 SENSITIVE Sensitive     CEFTRIAXONE <=[name] SENSITIVE Sensitive     CIPROFLOXACIN >=4 RESISTANT Resistant     GENTAMICIN <=[name] SENSITIVE Sensitive     LEVOFLOXACIN >=8 RESISTANT Resistant     NITROFURANTOIN <=16 SENSITIVE Sensitive     TOBRAMYCIN <=[name] SENSITIVE Sensitive     TRIMETH/SULFA <=20 SENSITIVE Sensitive     PIP/TAZO <=4 SENSITIVE Sensitive     * ESCHERICHIA COLI  Clostridium Difficile by PCR     Status: None   Collection Time: 12/12/14  4:14 PM  Result Value Ref Range Status   C difficile by pcr NEGATIVE NEGATIVE Final     Studies:  Recent x-ray studies have been reviewed in detail by the Attending Physician  Scheduled Meds:  Scheduled Meds: . antiseptic oral rinse  7 mL Mouth Rinse q12n4p  . chlorhexidine  15 mL Mouth Rinse BID  . [START ON 12/20/2014] digoxin  0.0625 mg Oral QODAY  . diltiazem  120 mg Oral Q12H  . feeding supplement (PRO-STAT SUGAR FREE 64)  30 mL Oral BID WC  . free water  100 mL Per Tube Q4H  . [START ON 12/20/2014] furosemide  40 mg Intravenous Daily  . heparin  5,000 Units Subcutaneous 3 times per day  . levothyroxine  75 mcg Oral QAC breakfast  . LORazepam  0.5 mg Intravenous Once  . multivitamin with minerals  [name] tablet Oral Daily    Time spent on care of this patient: 35  minutes   Cherene Altes, MD [facility] For Consults/Admissions - Flow Manager - [phone] Office  [phone]  Contact MD directly via text page:      [email or web address]      password [facility]  12/19/2014, 5:48 PM   LOS: 11 days

## 2014-12-19 NOTE — Progress Notes (Signed)
NP made aware that pt woke up confused and pulled out NG tube that was present for water flushes.

## 2014-12-19 NOTE — Progress Notes (Signed)
Speech Language Pathology Treatment: Dysphagia  Patient Details Name: [name] [name] MRN: [id number] DOB: 04-14-1921 Today's Date: 12/19/2014 Time: 1224-8250 SLP Time Calculation (min) (ACUTE ONLY): 21 min  Assessment / Plan / Recommendation Clinical Impression  Pt tolerating upgraded diet very well though total assist feeding and moderate verbal cues to fully clear oral cavity still needed. No evidence of aspiration observed today, not even any baseline cough as seen in prior assessments and treats. Recommend pt continue current diet with full supervision and esophageal precautions. SLP will f/u for possibility of upgrade to thin liquids next week.    HPI HPI: 81 year old male admitted 12/08/14 after fall at home, sustaining hip fracture. Orders received to evaluate for po readiness   Pertinent Vitals Pain Assessment: No/denies pain  SLP Plan  Continue with current plan of care    Recommendations Diet recommendations: Dysphagia 2 (fine chop);Nectar-thick liquid Liquids provided via: Cup Medication Administration: Crushed with puree Supervision: Staff to assist with self feeding Compensations: Slow rate;Small sips/bites Postural Changes and/or Swallow Maneuvers: Seated upright 90 degrees;Upright 30-60 min after meal              Oral Care Recommendations: Oral care BID Follow up Recommendations: Skilled Nursing facility Plan: Continue with current plan of care    GO    Supervised and reviewed by [facility] MA CCC-SLP  Tamura Lasky, Katherene Ponto 12/19/2014, 9:48 AM

## 2014-12-19 NOTE — Progress Notes (Signed)
Physical Therapy Treatment Patient Details Name: [name] [name] MRN: [id number] DOB: 1920/11/24 Today's Date: 12/19/2014    History of Present Illness 81 y.o. M who sustained left hip fx on 2/8 after mechanical fall at home. Taken to OR 2/9 for left hip hemiarthroplasty and following OR, returned to ICU on vent. Course complicated by AKI. Was living at home with hospice care.    PT Comments    Patient progressing slowly; still needs assist for anterior weight shifts and scooting edge of bed.  Was stating eagerness to return home.  Encouraged participation in hopes he can go home, but feel may need increased level of care even following SNF rehab.  Follow Up Recommendations  SNF     Equipment Recommendations  None recommended by PT    Recommendations for Other Services       Precautions / Restrictions Precautions Precautions: Fall Precaution Comments: contact Restrictions LLE Weight Bearing: Weight bearing as tolerated    Mobility  Bed Mobility   Bed Mobility: Supine to Sit     Supine to sit: Mod assist     General bed mobility comments: guidance, verbal cues, increased time to participate with moving legs to edge of bed and lifting trunk upright  Transfers Overall transfer level: Needs assistance Equipment used: Rolling walker (2 wheeled) Transfers: Sit to/from [facility] Sit to Stand: Mod assist;+2 physical assistance Stand pivot transfers: Mod assist;+2 physical assistance       General transfer comment: stood from bed and was incontinent of stool; returned to sit to obtain cleaning equip, stood second time, cues for feet under knees and anterior weight shift; stood for hygiene then pivot with walker and mod assist +2 for safety to chair; cues to hold walker, and stand erect due to reaching for chair before close enough  Ambulation/Gait                 Stairs            Wheelchair Mobility    Modified Rankin (Stroke Patients  Only)       Balance           Standing balance support: Bilateral upper extremity supported;During functional activity Standing balance-Leahy Scale: Poor Standing balance comment: stood with narrow BOS and bilat UE support on walker about 30 seconds with min/mod assist during hygiene                    Cognition Arousal/Alertness: Awake/alert Behavior During Therapy: Flat affect Overall Cognitive Status: Impaired/Different from baseline Area of Impairment: Following commands;Memory     Memory: Decreased short-term memory Following Commands: Follows one step commands with increased time     Problem Solving: Slow processing;Requires verbal cues;Requires tactile cues      Exercises Total Joint Exercises Ankle Circles/Pumps: AROM;Both;5 reps;Supine Short Arc Quad: AROM;Both;5 reps;Supine Heel Slides: AAROM;Both;5 reps;Supine    General Comments        Pertinent Vitals/Pain Faces Pain Scale: Hurts little more Pain Location: lt hip Pain Intervention(s): Monitored during session;Repositioned    Home Living                      Prior Function            PT Goals (current goals can now be found in the care plan section) Progress towards PT goals: Progressing toward goals    Frequency  Min 3X/week    PT Plan Current plan remains appropriate    Co-evaluation  End of Session Equipment Utilized During Treatment: Gait belt;Oxygen Activity Tolerance: Patient limited by fatigue Patient left: in chair;with call bell/phone within reach;with family/visitor present     Time: 8185-9093 PT Time Calculation (min) (ACUTE ONLY): 27 min  Charges:  $Therapeutic Exercise: 8-22 mins $Therapeutic Activity: 8-22 mins                    G Codes:      WYNN,CYNDI 01-07-15, 5:05 PM Magda Kiel, [place] 2015-01-07

## 2014-12-19 NOTE — Progress Notes (Signed)
       Patient Name: [name] [name] Date of Encounter: 12/19/2014    SUBJECTIVE: The patient is confused  TELEMETRY:  A. fib with controlled rate Filed Vitals:   12/19/14 0846 12/19/14 1044 12/19/14 1045 12/19/14 1136  BP: 132/55  131/56 108/51  Pulse: 89 82 84 82  Temp: 97.2 F (36.2 C)     TempSrc: Oral     Resp: 15  15 23   Height:      Weight:      SpO2: 98%  99% 98%    Intake/Output Summary (Last 24 hours) at 12/19/14 1155 Last data filed at 12/19/14 0600  Gross per 24 hour  Intake   1100 ml  Output   1100 ml  Net      0 ml   LABS: Basic Metabolic Panel:  Recent Labs  12/18/14 0500 12/18/14 1644 12/19/14 0500  NA 150*  --  147*  K 3.0* 3.6 3.3*  CL 110  --  108  CO2 35*  --  32  GLUCOSE 121*  --  119*  BUN 94*  --  94*  CREATININE 3.38*  --  3.31*  CALCIUM 8.3*  --  8.3*  MG  --  1.7  --    CBC:  Recent Labs  12/17/14 1055 12/18/14 0500  WBC 9.3 10.2  HGB 9.9* 8.7*  HCT 31.8* 27.6*  MCV 87.6 89.3  PLT 154 165   Radiology/Studies:  No new data  Physical Exam: Blood pressure 108/51, pulse 82, temperature 97.2 F (36.2 C), temperature source Oral, resp. rate 23, height 5\' 5"  (1.651 m), weight 186 lb 11.7 oz (84.7 kg), SpO2 98 %. Weight change: 1 lb 1.6 oz (0.5 kg)  Wt Readings from Last 3 Encounters:  12/19/14 186 lb 11.7 oz (84.7 kg)  10/23/13 168 lb 14 oz (76.6 kg)  07/25/13 162 lb (73.483 kg)   Respiratory pattern is stable with a rate of 16 Decreased skin turgor No edema in legs. Minimal presacral edema noted   ASSESSMENT:  1. Atrial fib with good rate control 2. Acute on chronic diastolic heart failure with minimal volume excess 3. Acute on chronic kidney injury with creatinine in BUN still elevated after initial decline. 4. Severe aortic stenosis  Plan:  1. Check dig level 2. Suggest decrease furosemide dose to 20 or 40 mg per day 3. We will sign off. Please call if we may help further. 4. Poor prognosis  Signed, Sinclair Grooms 12/19/2014, 11:55 AM

## 2014-12-20 LAB — BASIC METABOLIC PANEL
ANION GAP: 9 (ref 5–15)
BUN: 91 mg/dL — AB (ref 6–23)
CALCIUM: 8.4 mg/dL (ref 8.4–10.5)
CO2: 34 mmol/L — AB (ref 19–32)
Chloride: 104 mmol/L (ref 96–112)
Creatinine, Ser: 3.07 mg/dL — ABNORMAL HIGH (ref 0.50–1.35)
GFR calc non Af Amer: 16 mL/min — ABNORMAL LOW (ref >90)
GFR, EST AFRICAN AMERICAN: 19 mL/min — AB (ref >90)
Glucose, Bld: 107 mg/dL — ABNORMAL HIGH (ref 70–99)
POTASSIUM: 3.5 mmol/L (ref 3.5–5.1)
Sodium: 147 mmol/L — ABNORMAL HIGH (ref 135–145)

## 2014-12-20 LAB — CBC
HCT: 29.8 % — ABNORMAL LOW (ref 39.0–52.0)
Hemoglobin: 9.2 g/dL — ABNORMAL LOW (ref 13.0–17.0)
MCH: 27.5 pg (ref 26.0–34.0)
MCHC: 30.9 g/dL (ref 30.0–36.0)
MCV: 89 fL (ref 78.0–100.0)
Platelets: 201 10*3/uL (ref 150–400)
RBC: 3.35 MIL/uL — ABNORMAL LOW (ref 4.22–5.81)
RDW: 17.2 % — AB (ref 11.5–15.5)
WBC: 9 10*3/uL (ref 4.0–10.5)

## 2014-12-20 MED ORDER — SODIUM CHLORIDE 0.9 % IV BOLUS (SEPSIS)
250.0000 mL | Freq: Once | INTRAVENOUS | Status: AC
  Administered 2014-12-20: 250 mL via INTRAVENOUS

## 2014-12-20 NOTE — Progress Notes (Signed)
Report called to [place] at [phone]

## 2014-12-20 NOTE — Progress Notes (Signed)
[place] TEAM 1 - Stepdown/ICU TEAM Progress Note  [name] [name] GBT:[id number] DOB: [birth year] DOA: 12/08/2014 PCP: [name], MD  Admit HPI / Brief Narrative: 81 y.o. M who lives at home and has home health aides assist him. Hx chronic respiratory failure on home O2,  moderate aortic stenosis with severe pulmonary hypertension and RV dilatation, chronic diastolic heart failure, atrial fibrillation not on anticoagulation secondary to GI bleed, S/P CABG 1996, prostate cancer S/P surgery + XRT, and chronic kidney disease who was taking a bath and while attempting to put on his clothes, slipped and fell and developed left hip pain.   Upon presentation to the ER he was found to have an acute subcapital fracture of the left hip. His chest x-ray revealed mild interstitial edema and associated small bilateral pleural effusions.   HPI/Subjective: Pt is more alert and conversant today.  He states he is bored, and is anxious to get home.  He denies cp, n/v, or abdom pain.    Assessment/Plan:  Closed fracture of left hip  -s/p L hip hemiarthroplasty - Ortho following - full weight bearing and no hip precautions per Ortho - for d/c to SNF for rehab, hopefully Monday    Acute encephalopathy  -Most likely multifactorial to include hypernatremia, acute on chronic respiratory failure, acute on chronic renal failure w/ severe uremia, poorly controlled atrial fib, and hypotension -cleared by SLP for Pureed diet w/ nectar thick liquids -mental status is slowly improving   E coli UTI Pt was given 3 doses of cipro, but the organism has proven to be resistant to cipro - recheck UA remains worrisome for UTI - pt is allergic to cephalosporins and PCN - the pt however has not had a fever and has a normal WBC, as well as an indwelling foley - will cont to observe for now w/o abx - attempt to remove foley and follow for retention   Hypernatremia  -had been receiving free water via NG tube - pt pulled his NG  2/18 night - pushing oral intake - Na stable for now   Acute on chronic hypoxemic respiratory failure -Multifactorial to include moderate aortic stenosis, severe pulmonary hypertension, and RV dilatation - appears stable at present - wean to RA as able   Atrial fibrillation with RVR -Cardizem120 mg BID -continue digoxin 0.25 mg x2 doses, then starting 2/20 0.0625 mg QOD -Continue Lopressor PRN -Not on anticoagulation due to high fall risk  Acute on Chronic diastolic heart failure, NYHA class 1 -Unknown Base Weight - weight at admit 90.4kg - current weight 80.2kg -slow diuresis and follow    Pulmonary HTN -See Acute  On Chronic Diastolic CHF   HTN  -Patient currently modestly hypotensive most likely secondary to poor cardiac function, poorly controlled atrial fibrillation, acute on chronic renal failure, and diuresis   CAD/CABG 1996  Acute on Chronic Renal failure (Baseline ~2.03) -Patient's Cr has begun to slowly improve   History of prostatic problems and urinary incontinence  Anemia -Baseline hemoglobin around 9.1 -s/p 3U PRBC thus far - stable at this time   Dyslipidemia -No medications prior to admission  Hypothyroidism -Continue Synthroid -TSH within normal limits  Hypokalemia -cont to replace and follow   Code Status: FULL Family Communication:   Disposition Plan: plan for d/c to SNF for rehab Monday if continues to improve over the weekend - stable for tele bed transfer today   Consultants: Dr. Mcarthur Rossetti (Orthopedic surgery) Dr. Peter M Martinique (Cardiology) Dr.David B  Simonds (PCCM)  Procedure/Significant Events: 2/10 echocardiogram;Left ventricle: moderate concentric hypertrophy. LVEF= 50% to 55%.  - Aortic valve: moderate stenosis.  - Mitral valve: moderate regurgitation.  - Left/Right atrium: severely dilated. - Right ventricle: moderately dilated.  -Tricuspid valve:moderate-severe regurgitation. - Pulmonary arteries:PA peak pressure:  55 mm Hg (S).  Antibiotics: None  DVT prophylaxis: SCD  Objective: Blood pressure 104/55, pulse 91, temperature 97.6 F (36.4 C), temperature source Oral, resp. rate 18, height 5\' 5"  (1.651 m), weight 80.2 kg (176 lb 12.9 oz), SpO2 98 %.  Intake/Output Summary (Last 24 hours) at 12/20/14 1503 Last data filed at 12/20/14 0530  Gross per 24 hour  Intake      0 ml  Output   1500 ml  Net  -1500 ml   Exam: General: No acute respiratory distress Lungs: Clear to auscultation bilaterally without wheezes or crackles Cardiovascular: Regular rate without murmur gallop or rub normal S1 and S2 Abdomen: Nontender, nondistended, soft, bowel sounds positive, no rebound, no ascites, no appreciable mass Extremities: No significant cyanosis, or clubbing;  1+ edema bilateral lower extremities  Data Reviewed: Basic Metabolic Panel:  Recent Labs Lab 12/14/14 0910 12/15/14 0550 12/16/14 0500 12/17/14 1055 12/18/14 0500 12/18/14 1644 12/19/14 0500 12/20/14 0100  NA  --  149* 145 146* 150*  --  147* 147*  K  --  4.1 3.3* 3.2* 3.0* 3.6 3.3* 3.5  CL  --  112 107 109 110  --  108 104  CO2  --  28 25 31  35*  --  32 34*  GLUCOSE  --  111* 246* 239* 121*  --  119* 107*  BUN  --  101* 94* 93* 94*  --  94* 91*  CREATININE  --  4.42* 3.97* 3.74* 3.38*  --  3.31* 3.07*  CALCIUM  --  8.5 8.1* 8.1* 8.3*  --  8.3* 8.4  MG 1.8 1.9  --   --   --  1.7  --   --    Liver Function Tests:  Recent Labs Lab 12/14/14 1930 12/15/14 0550 12/16/14 0500  AST  --  40* 31  ALT  --  57* 45  ALKPHOS  --  77 72  BILITOT  --  1.7* 1.5*  PROT  --  6.3 6.1  ALBUMIN 2.1* 1.9* 1.9*   CBC:  Recent Labs Lab 12/14/14 0910 12/14/14 1930 12/16/14 0500 12/17/14 1055 12/18/14 0500 12/20/14 0100  WBC 10.2 9.9 8.0 9.3 10.2 9.0  NEUTROABS 8.8* 8.2*  --   --   --   --   HGB 7.5* 7.9* 10.0* 9.9* 8.7* 9.2*  HCT 23.9* 25.3* 31.1* 31.8* 27.6* 29.8*  MCV 88.5 87.8 86.6 87.6 89.3 89.0  PLT 127* 141* 130* 154 165 201      Recent Results (from the past 240 hour(s))  Clostridium Difficile by PCR     Status: None   Collection Time: 12/12/14  4:14 PM  Result Value Ref Range Status   C difficile by pcr NEGATIVE NEGATIVE Final     Studies:  Recent x-ray studies have been reviewed in detail by the Attending Physician  Scheduled Meds:  Scheduled Meds: . antiseptic oral rinse  7 mL Mouth Rinse q12n4p  . chlorhexidine  15 mL Mouth Rinse BID  . digoxin  0.0625 mg Oral QODAY  . diltiazem  120 mg Oral Q12H  . feeding supplement (PRO-STAT SUGAR FREE 64)  30 mL Oral BID WC  . furosemide  20 mg Oral Daily  .  heparin  5,000 Units Subcutaneous 3 times per day  . levothyroxine  75 mcg Oral QAC breakfast  . multivitamin with minerals  1 tablet Oral Daily  . potassium chloride  20 mEq Oral Daily    Time spent on care of this patient: 35 minutes   Cherene Altes, MD [facility] For Consults/Admissions - Flow Manager - [phone] Office  [phone]  Contact MD directly via text page:      [email or web address]      password [facility]  12/20/2014, 3:03 PM   LOS: 12 days

## 2014-12-21 LAB — BASIC METABOLIC PANEL
Anion gap: 2 — ABNORMAL LOW (ref 5–15)
BUN: 84 mg/dL — ABNORMAL HIGH (ref 6–23)
CHLORIDE: 107 mmol/L (ref 96–112)
CO2: 37 mmol/L — ABNORMAL HIGH (ref 19–32)
Calcium: 8.5 mg/dL (ref 8.4–10.5)
Creatinine, Ser: 2.98 mg/dL — ABNORMAL HIGH (ref 0.50–1.35)
GFR calc Af Amer: 20 mL/min — ABNORMAL LOW (ref >90)
GFR calc non Af Amer: 17 mL/min — ABNORMAL LOW (ref >90)
Glucose, Bld: 101 mg/dL — ABNORMAL HIGH (ref 70–99)
Potassium: 3.6 mmol/L (ref 3.5–5.1)
Sodium: 146 mmol/L — ABNORMAL HIGH (ref 135–145)

## 2014-12-21 LAB — CBC
HEMATOCRIT: 30.1 % — AB (ref 39.0–52.0)
Hemoglobin: 9.2 g/dL — ABNORMAL LOW (ref 13.0–17.0)
MCH: 27.2 pg (ref 26.0–34.0)
MCHC: 30.6 g/dL (ref 30.0–36.0)
MCV: 89.1 fL (ref 78.0–100.0)
PLATELETS: 252 10*3/uL (ref 150–400)
RBC: 3.38 MIL/uL — ABNORMAL LOW (ref 4.22–5.81)
RDW: 17.4 % — ABNORMAL HIGH (ref 11.5–15.5)
WBC: 8.4 10*3/uL (ref 4.0–10.5)

## 2014-12-21 MED ORDER — LEVOTHYROXINE SODIUM 75 MCG PO TABS
75.0000 ug | ORAL_TABLET | Freq: Every day | ORAL | Status: DC
  Administered 2014-12-22: 75 ug via ORAL
  Filled 2014-12-21 (×2): qty 1

## 2014-12-21 MED ORDER — PRO-STAT SUGAR FREE PO LIQD
30.0000 mL | Freq: Two times a day (BID) | ORAL | Status: DC
  Administered 2014-12-22: 30 mL via ORAL
  Filled 2014-12-21 (×3): qty 30

## 2014-12-21 NOTE — Progress Notes (Signed)
[place] TEAM 1 - Stepdown/ICU TEAM Progress Note  [name] [name] KNL:[id number] DOB: [birth year] DOA: 12/08/2014 PCP: [name], MD  Admit HPI / Brief Narrative: 81 y.o. M who lives at home and has home health aides assist him. Hx chronic respiratory failure on home O2,  moderate aortic stenosis with severe pulmonary hypertension and RV dilatation, chronic diastolic heart failure, atrial fibrillation not on anticoagulation secondary to GI bleed, S/P CABG 1996, prostate cancer S/P surgery + XRT, and chronic kidney disease who was taking a bath and while attempting to put on his clothes, slipped and fell and developed left hip pain.   Upon presentation to the ER he was found to have an acute subcapital fracture of the left hip. His chest x-ray revealed mild interstitial edema and associated small bilateral pleural effusions.   HPI/Subjective: Pt remains alert.  No complaints today.  Remains motivated to be d/c.     Assessment/Plan:  Closed fracture of left hip  -s/p L hip hemiarthroplasty - Ortho following - full weight bearing and no hip precautions per Ortho - for d/c to SNF for rehab, hopefully Monday    Acute encephalopathy  -Most likely multifactorial to include hypernatremia, acute on chronic respiratory failure, acute on chronic renal failure w/ severe uremia, poorly controlled atrial fib, and hypotension -cleared by SLP for Pureed diet w/ nectar thick liquids -mental status is slowly improving   E coli UTI -Pt was given 3 doses of cipro, but the organism has proven to be resistant to cipro - recheck UA remains worrisome for UTI - pt is allergic to cephalosporins and PCN - the pt however has not had a fever and has a normal WBC, as well as an indwelling foley - will cont to observe for now w/o abx - foley had to be placed by Urology due to obstruction, and they recommend it remain in place for 3-4 weeks at which time it should be changed or removed     Hypernatremia  -had been  receiving free water via NG tube - pt pulled his NG 2/18 night - pushing oral intake - Na stable for now   Acute on chronic hypoxemic respiratory failure -Multifactorial to include moderate aortic stenosis, severe pulmonary hypertension, and RV dilatation - appears stable at present - weaned to RA   Atrial fibrillation with RVR -Cardizem120 mg BID -continue digoxin 0.25 mg x2 doses, then starting 2/20 0.0625 mg QOD -Continue Lopressor PRN -Not on anticoagulation due to high fall risk -rate well controlled on current regimen   Acute on Chronic diastolic heart failure, NYHA class 1 -Unknown Base Weight - weight at admit 90.4kg - current weight 80.2kg -stopped diuresis due to relative hypotension - follow    Pulmonary HTN -See Acute  On Chronic Diastolic CHF   HTN  -Patient was modestly hypotensive most likely secondary to poor cardiac function, poorly controlled atrial fibrillation, acute on chronic renal failure, and diuresis - stopped diuretic - BP stable   CAD/CABG 1996  Acute on Chronic Renal failure (Baseline ~2.03) -Patient's Cr continues to slowly improve   History of prostatic problems and urinary incontinence  Anemia -Baseline hemoglobin around 9.1 -s/p 3U PRBC thus far - stable at this time   Dyslipidemia -No medications prior to admission  Hypothyroidism -Continue Synthroid -TSH within normal limits  Hypokalemia -cont to replace as needed and follow   Code Status: FULL Family Communication:   Disposition Plan: plan for d/c to SNF for rehab Monday   Consultants: Dr.  Mcarthur Rossetti (Orthopedic surgery) Dr. Peter M Martinique (Cardiology) Dr.David B Simonds (PCCM)  Procedure/Significant Events: 2/10 echocardiogram;Left ventricle: moderate concentric hypertrophy. LVEF= 50% to 55%.  - Aortic valve: moderate stenosis.  - Mitral valve: moderate regurgitation.  - Left/Right atrium: severely dilated. - Right ventricle: moderately dilated.  -Tricuspid  valve:moderate-severe regurgitation. - Pulmonary arteries:PA peak pressure: 55 mm Hg (S).  Antibiotics: None  DVT prophylaxis: SCD  Objective: Blood pressure 117/56, pulse 74, temperature 97.9 F (36.6 C), temperature source Oral, resp. rate 18, height 5\' 8"  (1.727 m), weight 80.2 kg (176 lb 12.9 oz), SpO2 94 %.  Intake/Output Summary (Last 24 hours) at 12/21/14 1705 Last data filed at 12/21/14 0939  Gross per 24 hour  Intake    330 ml  Output   1080 ml  Net   -750 ml   Exam: General: No acute respiratory distress - alert and pleasant  Lungs: Clear to auscultation bilaterally without wheezes or crackles Cardiovascular: Regular rate without murmur gallop or rub normal S1 and S2 Abdomen: Nontender, nondistended, soft, bowel sounds positive, no rebound, no ascites, no appreciable mass Extremities: No significant cyanosis, or clubbing;  trace edema bilateral lower extremities  Data Reviewed: Basic Metabolic Panel:  Recent Labs Lab 12/15/14 0550  12/17/14 1055 12/18/14 0500 12/18/14 1644 12/19/14 0500 12/20/14 0100 12/21/14 0504  NA 149*  < > 146* 150*  --  147* 147* 146*  K 4.1  < > 3.2* 3.0* 3.6 3.3* 3.5 3.6  CL 112  < > 109 110  --  108 104 107  CO2 28  < > 31 35*  --  32 34* 37*  GLUCOSE 111*  < > 239* 121*  --  119* 107* 101*  BUN 101*  < > 93* 94*  --  94* 91* 84*  CREATININE 4.42*  < > 3.74* 3.38*  --  3.31* 3.07* 2.98*  CALCIUM 8.5  < > 8.1* 8.3*  --  8.3* 8.4 8.5  MG 1.9  --   --   --  1.7  --   --   --   < > = values in this interval not displayed.   Liver Function Tests:  Recent Labs Lab 12/14/14 1930 12/15/14 0550 12/16/14 0500  AST  --  40* 31  ALT  --  57* 45  ALKPHOS  --  77 72  BILITOT  --  1.7* 1.5*  PROT  --  6.3 6.1  ALBUMIN 2.1* 1.9* 1.9*   CBC:  Recent Labs Lab 12/14/14 1930 12/16/14 0500 12/17/14 1055 12/18/14 0500 12/20/14 0100 12/21/14 0504  WBC 9.9 8.0 9.3 10.2 9.0 8.4  NEUTROABS 8.2*  --   --   --   --   --   HGB 7.9*  10.0* 9.9* 8.7* 9.2* 9.2*  HCT 25.3* 31.1* 31.8* 27.6* 29.8* 30.1*  MCV 87.8 86.6 87.6 89.3 89.0 89.1  PLT 141* 130* 154 165 201 252    Recent Results (from the past 240 hour(s))  Clostridium Difficile by PCR     Status: None   Collection Time: 12/12/14  4:14 PM  Result Value Ref Range Status   C difficile by pcr NEGATIVE NEGATIVE Final  Culture, Urine     Status: None (Preliminary result)   Collection Time: 12/19/14  6:52 PM  Result Value Ref Range Status   Specimen Description URINE, RANDOM  Final   Special Requests NONE  Final   Colony Count   Final    >=100,000 COLONIES/ML Performed  at [place] Performed at [facility]    Report Status PENDING  Incomplete     Studies:  Recent x-ray studies have been reviewed in detail by the Attending Physician  Scheduled Meds:  Scheduled Meds: . antiseptic oral rinse  7 mL Mouth Rinse q12n4p  . chlorhexidine  15 mL Mouth Rinse BID  . digoxin  0.0625 mg Oral QODAY  . diltiazem  120 mg Oral Q12H  . feeding supplement (PRO-STAT SUGAR FREE 64)  30 mL Oral BID WC  . heparin  5,000 Units Subcutaneous 3 times per day  . levothyroxine  75 mcg Oral QAC breakfast  . multivitamin with minerals  1 tablet Oral Daily  . potassium chloride  20 mEq Oral Daily    Time spent on care of this patient: 25 minutes   Cherene Altes, MD [facility] For Consults/Admissions - Flow Manager - [phone] Office  [phone]  Contact MD directly via text page:      [email or web address]      password [facility]  12/21/2014, 5:05 PM   LOS: 13 days

## 2014-12-21 NOTE — Significant Event (Signed)
Lt hip dsg saturated with blood and pus. Incision site red ,swollen and warm to touch oozing blood and pus. Incision site cleaned dsg changed. Dr Thereasa Solo  Paged. Will continue to monitor patient.

## 2014-12-21 NOTE — Progress Notes (Deleted)
[place] TEAM 1 - Stepdown/ICU TEAM Progress Note  WYNTER GRAVE WER:[id number] DOB: 07-13-1921 DOA: 12/08/2014 PCP: [name], MD  Admit HPI / Brief Narrative: 81 y.o. M who lives at home and has home health aides assist him. Hx chronic respiratory failure on home O2,  moderate aortic stenosis with severe pulmonary hypertension and RV dilatation, chronic diastolic heart failure, atrial fibrillation not on anticoagulation secondary to GI bleed, S/P CABG 1996, prostate cancer S/P surgery + XRT, and chronic kidney disease who was taking a bath and while attempting to put on his clothes, slipped and fell and developed left hip pain.   Upon presentation to the ER he was found to have an acute subcapital fracture of the left hip. His chest x-ray revealed mild interstitial edema and associated small bilateral pleural effusions.   HPI/Subjective: Pt remains alert.  No complaints today.  Remains motivated to be d/c.     Assessment/Plan:  Closed fracture of left hip  -s/p L hip hemiarthroplasty - Ortho following - full weight bearing and no hip precautions per Ortho - for d/c to SNF for rehab, hopefully Monday    Acute encephalopathy  -Most likely multifactorial to include hypernatremia, acute on chronic respiratory failure, acute on chronic renal failure w/ severe uremia, poorly controlled atrial fib, and hypotension -cleared by SLP for Pureed diet w/ nectar thick liquids -mental status is slowly improving   E coli UTI -Pt was given 3 doses of cipro, but the organism has proven to be resistant to cipro - recheck UA remains worrisome for UTI - pt is allergic to cephalosporins and PCN - the pt however has not had a fever and has a normal WBC, as well as an indwelling foley - will cont to observe for now w/o abx    Hypernatremia  -had been receiving free water via NG tube - pt pulled his NG 2/18 night - pushing oral intake - Na stable for now   Acute on chronic hypoxemic respiratory  failure -Multifactorial to include moderate aortic stenosis, severe pulmonary hypertension, and RV dilatation - appears stable at present - weaned to RA   Atrial fibrillation with RVR -Cardizem120 mg BID -continue digoxin 0.25 mg x2 doses, then starting 2/20 0.0625 mg QOD -Continue Lopressor PRN -Not on anticoagulation due to high fall risk -rate well controlled on current regimen   Acute on Chronic diastolic heart failure, NYHA class 1 -Unknown Base Weight - weight at admit 90.4kg - current weight 80.2kg -stopped diuresis due to relative hypotension - follow    Pulmonary HTN -See Acute  On Chronic Diastolic CHF   HTN  -Patient was modestly hypotensive most likely secondary to poor cardiac function, poorly controlled atrial fibrillation, acute on chronic renal failure, and diuresis - stopped diuretic - BP stable   CAD/CABG 1996  Acute on Chronic Renal failure (Baseline ~2.03) -Patient's Cr continues to slowly improve   History of prostatic problems and urinary incontinence  Anemia -Baseline hemoglobin around 9.1 -s/p 3U PRBC thus far - stable at this time   Dyslipidemia -No medications prior to admission  Hypothyroidism -Continue Synthroid -TSH within normal limits  Hypokalemia -cont to replace as needed and follow   Code Status: FULL Family Communication:   Disposition Plan: plan for d/c to SNF for rehab Monday   Consultants: Dr. Mcarthur Rossetti (Orthopedic surgery) Dr. Peter M Martinique (Cardiology) Dr.David B Simonds (PCCM)  Procedure/Significant Events: 2/10 echocardiogram;Left ventricle: moderate concentric hypertrophy. LVEF= 50% to 55%.  - Aortic valve:  moderate stenosis.  - Mitral valve: moderate regurgitation.  - Left/Right atrium: severely dilated. - Right ventricle: moderately dilated.  -Tricuspid valve:moderate-severe regurgitation. - Pulmonary arteries:PA peak pressure: 55 mm Hg (S).  Antibiotics: None  DVT  prophylaxis: SCD  Objective: Blood pressure 117/56, pulse 74, temperature 97.9 F (36.6 C), temperature source Oral, resp. rate 18, height 5\' 8"  (1.727 m), weight 80.2 kg (176 lb 12.9 oz), SpO2 94 %.  Intake/Output Summary (Last 24 hours) at 12/21/14 1250 Last data filed at 12/21/14 0939  Gross per 24 hour  Intake    570 ml  Output   1480 ml  Net   -910 ml   Exam: General: No acute respiratory distress - alert and pleasant  Lungs: Clear to auscultation bilaterally without wheezes or crackles Cardiovascular: Regular rate without murmur gallop or rub normal S1 and S2 Abdomen: Nontender, nondistended, soft, bowel sounds positive, no rebound, no ascites, no appreciable mass Extremities: No significant cyanosis, or clubbing;  trace edema bilateral lower extremities  Data Reviewed: Basic Metabolic Panel:  Recent Labs Lab 12/15/14 0550  12/17/14 1055 12/18/14 0500 12/18/14 1644 12/19/14 0500 12/20/14 0100 12/21/14 0504  NA 149*  < > 146* 150*  --  147* 147* 146*  K 4.1  < > 3.2* 3.0* 3.6 3.3* 3.5 3.6  CL 112  < > 109 110  --  108 104 107  CO2 28  < > 31 35*  --  32 34* 37*  GLUCOSE 111*  < > 239* 121*  --  119* 107* 101*  BUN 101*  < > 93* 94*  --  94* 91* 84*  CREATININE 4.42*  < > 3.74* 3.38*  --  3.31* 3.07* 2.98*  CALCIUM 8.5  < > 8.1* 8.3*  --  8.3* 8.4 8.5  MG 1.9  --   --   --  1.7  --   --   --   < > = values in this interval not displayed.   Liver Function Tests:  Recent Labs Lab 12/14/14 1930 12/15/14 0550 12/16/14 0500  AST  --  40* 31  ALT  --  57* 45  ALKPHOS  --  77 72  BILITOT  --  1.7* 1.5*  PROT  --  6.3 6.1  ALBUMIN 2.1* 1.9* 1.9*   CBC:  Recent Labs Lab 12/14/14 1930 12/16/14 0500 12/17/14 1055 12/18/14 0500 12/20/14 0100 12/21/14 0504  WBC 9.9 8.0 9.3 10.2 9.0 8.4  NEUTROABS 8.2*  --   --   --   --   --   HGB 7.9* 10.0* 9.9* 8.7* 9.2* 9.2*  HCT 25.3* 31.1* 31.8* 27.6* 29.8* 30.1*  MCV 87.8 86.6 87.6 89.3 89.0 89.1  PLT 141* 130*  154 165 201 252    Recent Results (from the past 240 hour(s))  Clostridium Difficile by PCR     Status: None   Collection Time: 12/12/14  4:14 PM  Result Value Ref Range Status   C difficile by pcr NEGATIVE NEGATIVE Final  Culture, Urine     Status: None (Preliminary result)   Collection Time: 12/19/14  6:52 PM  Result Value Ref Range Status   Specimen Description URINE, RANDOM  Final   Special Requests NONE  Final   Colony Count   Final    >=100,000 COLONIES/ML Performed at [facility]    Culture   Final    ESCHERICHIA COLI Performed at [facility]    Report Status PENDING  Incomplete  Studies:  Recent x-ray studies have been reviewed in detail by the Attending Physician  Scheduled Meds:  Scheduled Meds: . antiseptic oral rinse  7 mL Mouth Rinse q12n4p  . chlorhexidine  15 mL Mouth Rinse BID  . digoxin  0.0625 mg Oral QODAY  . diltiazem  120 mg Oral Q12H  . feeding supplement (PRO-STAT SUGAR FREE 64)  30 mL Oral BID WC  . heparin  5,000 Units Subcutaneous 3 times per day  . levothyroxine  75 mcg Oral QAC breakfast  . multivitamin with minerals  1 tablet Oral Daily  . potassium chloride  20 mEq Oral Daily    Time spent on care of this patient: 25 minutes   Cherene Altes, MD [facility] For Consults/Admissions - Flow Manager - [phone] Office  [phone]  Contact MD directly via text page:      [email or web address]      password [facility]  12/21/2014, 12:50 PM   LOS: 13 days

## 2014-12-22 LAB — BASIC METABOLIC PANEL
Anion gap: 4 — ABNORMAL LOW (ref 5–15)
BUN: 78 mg/dL — ABNORMAL HIGH (ref 6–23)
CALCIUM: 8.1 mg/dL — AB (ref 8.4–10.5)
CO2: 33 mmol/L — ABNORMAL HIGH (ref 19–32)
Chloride: 110 mmol/L (ref 96–112)
Creatinine, Ser: 2.72 mg/dL — ABNORMAL HIGH (ref 0.50–1.35)
GFR calc Af Amer: 22 mL/min — ABNORMAL LOW (ref >90)
GFR calc non Af Amer: 19 mL/min — ABNORMAL LOW (ref >90)
GLUCOSE: 111 mg/dL — AB (ref 70–99)
Potassium: 3.8 mmol/L (ref 3.5–5.1)
Sodium: 147 mmol/L — ABNORMAL HIGH (ref 135–145)

## 2014-12-22 LAB — URINE CULTURE: Colony Count: >=100000

## 2014-12-22 LAB — DIGOXIN LEVEL: Digoxin Level: 0.8 ng/mL (ref 0.8–2.0)

## 2014-12-22 MED ORDER — CIPROFLOXACIN HCL 250 MG PO TABS
250.0000 mg | ORAL_TABLET | Freq: Every day | ORAL | Status: DC
  Administered 2014-12-22: 250 mg via ORAL
  Filled 2014-12-22: qty 1

## 2014-12-22 MED ORDER — POLYETHYLENE GLYCOL 3350 17 G PO PACK: 17.0000 g | PACK | Freq: Every day | ORAL | Status: AC | PRN

## 2014-12-22 MED ORDER — PRO-STAT SUGAR FREE PO LIQD: 30.0000 mL | Freq: Two times a day (BID) | ORAL | Status: AC

## 2014-12-22 MED ORDER — DIGOXIN 62.5 MCG PO TABS: 0.0625 mg | ORAL_TABLET | ORAL | Status: AC

## 2014-12-22 MED ORDER — HEPARIN SODIUM (PORCINE) 5000 UNIT/ML IJ SOLN: 5000.0000 [IU] | Freq: Three times a day (TID) | INTRAMUSCULAR | Status: DC

## 2014-12-22 MED ORDER — CIPROFLOXACIN HCL 250 MG PO TABS: 250.0000 mg | ORAL_TABLET | Freq: Every day | ORAL | Status: DC

## 2014-12-22 MED ORDER — ADULT MULTIVITAMIN W/MINERALS CH: 1.0000 | ORAL_TABLET | Freq: Every day | ORAL | Status: DC

## 2014-12-22 MED ORDER — LEVALBUTEROL HCL 0.63 MG/3ML IN NEBU: 0.6300 mg | INHALATION_SOLUTION | RESPIRATORY_TRACT | Status: DC | PRN

## 2014-12-22 MED ORDER — CIPROFLOXACIN HCL 250 MG PO TABS
250.0000 mg | ORAL_TABLET | Freq: Two times a day (BID) | ORAL | Status: DC
  Filled 2014-12-22 (×3): qty 1

## 2014-12-22 MED ORDER — CIPROFLOXACIN HCL 250 MG PO TABS: 250.0000 mg | ORAL_TABLET | Freq: Two times a day (BID) | ORAL | Status: DC

## 2014-12-22 MED ORDER — DILTIAZEM HCL ER 120 MG PO CP12: 120.0000 mg | ORAL_CAPSULE | Freq: Two times a day (BID) | ORAL | Status: AC

## 2014-12-22 NOTE — Progress Notes (Signed)
Pt weaned to room air. O2 sat 95-98% while resting in bed on room air. RN and NT attempted to stand pt up for an accurate standing weight, however pt not able to tolerate standing for more than a few seconds. Bed weight taken. L hip incision dressing clean, dry, and intact at this time. Will continue to monitor.

## 2014-12-22 NOTE — Discharge Summary (Signed)
DISCHARGE SUMMARY  [name] [name]  MR#: [id number]  DOB:05-06-1921  Date of Admission: 12/08/2014 Date of Discharge: 12/22/2014   Attending Physician:Gaia Gullikson T  Patient's QMG:QQPYP,PJKD M, MD  Consults: Dr. Mcarthur Rossetti (Orthopedic surgery) Dr. Peter M Martinique (Cardiology) Dr.David B Simonds (PCCM)  Disposition: D/C to SNF for long term rehab   Follow-up Appts:     Follow-up Information    Follow up with Mcarthur Rossetti, MD. Schedule an appointment as soon as possible for a visit in 1 week.   Specialty:  Orthopedic Surgery   Contact information:   [address] [place] [address] [phone]      Tests Needing Follow-up: -L lateral hip wound will need to be watched closely - it is edematous and weeping but does not currently appear frankly infected  -foley cath will need to be removed, or at least changed on February 29 -recheck of Na is suggested in 5 days -daily weights/volume status should be followed    Diet: Dysphagia 2 nectar thick liquids due to generalize weakness leading to aspiration risk  Discharge Diagnoses: Closed fracture of left hip  Acute encephalopathy  E coli UTI Hypernatremia  Acute on chronic hypoxemic respiratory failure Atrial fibrillation with RVR Acute on Chronic diastolic heart failure, NYHA class 1 Pulmonary HTN HTN  CAD/CABG 1996 Acute on Chronic Renal failure (Baseline ~2.03) History of prostatic problems and urinary incontinence Anemia Dyslipidemia Hypothyroidism Hypokalemia  Initial presentation: 81 y.o. M who previously lived at home w/ home health aides assisting him. Hx chronic respiratory failure on home O2, moderate aortic stenosis with severe pulmonary hypertension and RV dilatation, chronic diastolic heart failure, atrial fibrillation not on anticoagulation secondary to GI bleed, S/P CABG 1996, prostate cancer S/P surgery + XRT, and chronic kidney disease who was taking a bath and while  attempting to put on his clothes, slipped and fell and developed left hip pain.   Upon presentation to the ER he was found to have an acute subcapital fracture of the left hip. His chest x-ray revealed mild interstitial edema and associated small bilateral pleural effusions.   Hospital Course:  Closed fracture of left hip  -s/p L hip hemiarthroplasty - Ortho following - full weight bearing and no hip precautions per Ortho - for d/c to SNF for rehab - surgical wound inspected by Dr. Thereasa Solo on the date of d/c and found to be quite edematous (bedbound) and red but w/o purulent d/c or dehiscence - it is not felt that the wound is currently infected, but a prophylactic course of cipro is being prescribed for a 7 day course - daily wound inspections and frequent dressing changes will be required - the pt is to f/u w/ his Orthopedist one week post d/c - his staples will be addressed that time   Acute encephalopathy  -Most likely multifactorial to include hypernatremia, acute on chronic respiratory failure, acute on chronic renal failure w/ severe uremia, poorly controlled atrial fib, and hypotension -cleared by SLP for Pureed diet w/ nectar thick liquids -mental status is slowly improving at the time of d/c   E coli UTI -Pt was given 3 doses of cipro, but the organism was proven to be resistant to cipro - recheck UA remained worrisome for UTI - pt is allergic to cephalosporins and PCN - the pt however had not had a fever and had a normal WBC, as well as an indwelling foley - observed w/o additional abx - foley had to be placed by Urology due  to obstruction earlier in the hospital stay (related to phimosis) who recommended it remain in place for 3-4 weeks at which time it should be changed or removed   Hypernatremia  -had been receiving free water via NG tube - pt pulled his NG 2/18 night - pushed oral intake - Na stable at 147 at time of d/c    Acute on chronic hypoxemic respiratory  failure -Multifactorial to include moderate aortic stenosis, severe pulmonary hypertension, and RV dilatation - appears stable at present - weaned to RA when inactive - will likely require O2 support w/ ambulation   Atrial fibrillation with RVR -Cardizem120 mg BID -continue digoxin 0.0625 mg QOD -Not on anticoagulation due to high fall risk -rate well controlled on current regimen   Acute on Chronic diastolic heart failure, NYHA class 1 -Unknown Base Weight - weight at admit 90.4kg - d/c weight 80.7kg -stopped diuresis due to relative hypotension - follow   Pulmonary HTN -See Acuteon Chronic Diastolic CHF  HTN  -Patient was modestly hypotensive most likely secondary to poor cardiac function, poorly controlled atrial fibrillation, acute on chronic renal failure, and diuresis - stopped diuretic - BP stable   CAD/CABG 1996 -asymptomatic   Acute on Chronic Renal failure (Baseline ~2.03) -Patient's Cr continues to slowly improve   History of prostatic problems and urinary incontinence -foley cath placed by Urology on 12/09/14 - cath will need to be removed or replaced on 12/29/14  Anemia -Baseline hemoglobin around 9.1 -s/p 3U PRBC this admit - Hgb stable at 9.2 at time of d/c    Dyslipidemia -resume Lipitor at time of d/c   Hypothyroidism -Continue Synthroid -TSH within normal limits  Hypokalemia -cont to replace as needed and follow     Medication List    STOP taking these medications        budesonide 3 MG 24 hr capsule  Commonly known as:  ENTOCORT EC     febuxostat 40 MG tablet  Commonly known as:  ULORIC     furosemide 20 MG tablet  Commonly known as:  LASIX     gabapentin 100 MG capsule  Commonly known as:  NEURONTIN     levofloxacin 250 MG tablet  Commonly known as:  LEVAQUIN     losartan 100 MG tablet  Commonly known as:  COZAAR     metoprolol 50 MG tablet  Commonly known as:  LOPRESSOR     primidone 50 MG tablet  Commonly known as:  MYSOLINE       TAKE these medications        acetaminophen 325 MG tablet  Commonly known as:  TYLENOL  Take 2 tablets (650 mg total) by mouth every 6 (six) hours as needed.     atorvastatin 10 MG tablet  Commonly known as:  LIPITOR  Take 10 mg by mouth daily.     calcium carbonate 500 MG chewable tablet  Commonly known as:  TUMS - dosed in mg elemental calcium  Chew 1 tablet by mouth at bedtime.     ciprofloxacin 250 MG tablet  Commonly known as:  CIPRO  Take 1 tablet (250 mg total) by mouth daily at 12 noon.     Digoxin 62.5 MCG Tabs  Take 0.0625 mg by mouth every other day.     diltiazem 120 MG 12 hr capsule  Commonly known as:  CARDIZEM SR  Take 1 capsule (120 mg total) by mouth every 12 (twelve) hours.     feeding supplement (PRO-STAT SUGAR  FREE 64) Liqd  Take 30 mLs by mouth 2 (two) times daily with a meal.     heparin 5000 UNIT/ML injection  Inject 1 mL (5,000 Units total) into the skin every 8 (eight) hours.     levalbuterol 0.63 MG/3ML nebulizer solution  Commonly known as:  XOPENEX  Take 3 mLs (0.63 mg total) by nebulization every 3 (three) hours as needed for wheezing or shortness of breath.     levothyroxine 75 MCG tablet  Commonly known as:  SYNTHROID, LEVOTHROID  Take 75 mcg by mouth daily.     multivitamin with minerals Tabs tablet  Take 1 tablet by mouth daily.     polyethylene glycol packet  Commonly known as:  MIRALAX / GLYCOLAX  Take 17 g by mouth daily as needed for mild constipation.        Day of Discharge BP 101/33 mmHg  Pulse 74  Temp(Src) 98.4 F (36.9 C) (Oral)  Resp 18  Ht 5\' 8"  (1.727 m)  Wt 80.7 kg (177 lb 14.6 oz)  BMI 27.06 kg/m2  SpO2 92%  Physical Exam: General: No acute respiratory distress Lungs: Clear to auscultation bilaterally without wheezes or crackles Cardiovascular: Regular rate and rhythm without murmur gallop or rub normal S1 and S2 Abdomen: Nontender, nondistended, soft, bowel sounds positive, no rebound, no ascites,  no appreciable mass Extremities: No significant cyanosis, clubbing, or edema bilateral lower extremities - 2+ dependent edema of B thighs  Cutaenous:  L lateral thigh wound is weeping serosanguinous type fluid - staples are in place - no purulent d/c and none expressed when pressure applied to wound - no induration despite edema - some erythema   Basic Metabolic Panel:  Recent Labs Lab 12/18/14 0500 12/18/14 1644 12/19/14 0500 12/20/14 0100 12/21/14 0504 12/22/14 0451  NA 150*  --  147* 147* 146* 147*  K 3.0* 3.6 3.3* 3.5 3.6 3.8  CL 110  --  108 104 107 110  CO2 35*  --  32 34* 37* 33*  GLUCOSE 121*  --  119* 107* 101* 111*  BUN 94*  --  94* 91* 84* 78*  CREATININE 3.38*  --  3.31* 3.07* 2.98* 2.72*  CALCIUM 8.3*  --  8.3* 8.4 8.5 8.1*  MG  --  1.7  --   --   --   --     Liver Function Tests:  Recent Labs Lab 12/16/14 0500  AST 31  ALT 45  ALKPHOS 72  BILITOT 1.5*  PROT 6.1  ALBUMIN 1.9*   CBC:  Recent Labs Lab 12/16/14 0500 12/17/14 1055 12/18/14 0500 12/20/14 0100 12/21/14 0504  WBC 8.0 9.3 10.2 9.0 8.4  HGB 10.0* 9.9* 8.7* 9.2* 9.2*  HCT 31.1* 31.8* 27.6* 29.8* 30.1*  MCV 86.6 87.6 89.3 89.0 89.1  PLT 130* 154 165 201 252    Recent Results (from the past 240 hour(s))  Clostridium Difficile by PCR     Status: None   Collection Time: 12/12/14  4:14 PM  Result Value Ref Range Status   C difficile by pcr NEGATIVE NEGATIVE Final  Culture, Urine     Status: None (Preliminary result)   Collection Time: 12/19/14  6:52 PM  Result Value Ref Range Status   Specimen Description URINE, RANDOM  Final   Special Requests NONE  Final   Colony Count   Final    >=100,000 COLONIES/ML Performed at [facility]    Culture   Final    ESCHERICHIA COLI Performed at  [facility]    Report Status PENDING  Incomplete      Time spent in discharge (includes decision making & examination of pt): >30 minutes  12/22/2014, 10:23 AM   Cherene Altes, MD [facility] Office  [phone] Pager [phone]  On-Call/Text Page:      [email or web address]      password [facility]

## 2014-12-22 NOTE — Clinical Social Work Placement (Signed)
     Clinical Social Work Department CLINICAL SOCIAL WORK PLACEMENT NOTE 12/22/2014  Patient:  JAMAIR, CATO  Account Number:  [id number] Admit date:  12/08/2014  Clinical Social Worker:  Domenica Reamer, CLINICAL SOCIAL WORKER  Date/time:  12/16/2014 03:00 PM  Clinical Social Work is seeking post-discharge placement for this patient at the following level of care:   [place]   (*CSW will update this form in Epic as items are completed)   12/16/2014  Patient/family provided with [place] Department of Clinical Social Works list of facilities offering this level of care within the geographic area requested by the patient (or if unable, by the patients family).  12/16/2014  Patient/family informed of their freedom to choose among providers that offer the needed level of care, that participate in Medicare, Medicaid or managed care program needed by the patient, have an available bed and are willing to accept the patient.  12/16/2014  Patient/family informed of [facility] ownership interest in [facility], as well as of the fact that they are under no obligation to receive care at this facility.  PASARR submitted to EDS on 12/16/2014 PASARR number received on 12/16/2014  FL2 transmitted to all facilities in geographic area requested by pt/family on  12/16/2014 FL2 transmitted to all facilities within larger geographic area on   Patient informed that his/her managed care company has contracts with or will negotiate with  certain facilities, including the following:   [facility] Medicare     Patient/family informed of bed offers received:  12/21/2014 Patient chooses bed at [place] Physician recommends and patient chooses bed at    Patient to be transferred to [place] on  12/22/2014 Patient to be transferred to facility by Ambulance [facility]) Patient and family notified of transfer on 12/22/2014 Name of family member notified:  Donnel Saxon- Dgt- (message  left)- SNF notified  The following physician request were entered in Epic: Physician Request  Please prepare priority discharge summary and prescriptions.  Please sign FL2.    Additional Comments: 12/22/14  OK per MD for d/c today to SNF.  Patient stated that he is feeling better and is glad to be leaving the hospital.  He was hoping to go home but relates that he knows he is too weak at this time to manage at home. Patient verbalized concerns that he wouldn't be able to get out of the SNF; CSW provided reassurance and support and discussed with him that he could leave anytime he chose and this reassured him.  He stated that he felt much better about this.  Patient's daughters went to sign his admissions paperwork at the facility and CSW left a message for the daughter Hoyle Sauer re: dc.  Nursing notified to call report to facility; CSW signing off.  Lorie Phenix. [place], [place]

## 2014-12-23 ENCOUNTER — Non-Acute Institutional Stay (SKILLED_NURSING_FACILITY): Payer: Medicare Other | Admitting: Registered Nurse

## 2014-12-23 DIAGNOSIS — D638 Anemia in other chronic diseases classified elsewhere: Secondary | ICD-10-CM | POA: Diagnosis not present

## 2014-12-23 DIAGNOSIS — I4891 Unspecified atrial fibrillation: Secondary | ICD-10-CM | POA: Diagnosis not present

## 2014-12-23 DIAGNOSIS — E785 Hyperlipidemia, unspecified: Secondary | ICD-10-CM

## 2014-12-23 DIAGNOSIS — K922 Gastrointestinal hemorrhage, unspecified: Secondary | ICD-10-CM

## 2014-12-23 DIAGNOSIS — I10 Essential (primary) hypertension: Secondary | ICD-10-CM | POA: Diagnosis not present

## 2014-12-23 DIAGNOSIS — Z978 Presence of other specified devices: Secondary | ICD-10-CM

## 2014-12-23 DIAGNOSIS — Z9889 Other specified postprocedural states: Secondary | ICD-10-CM | POA: Diagnosis not present

## 2014-12-23 DIAGNOSIS — L89150 Pressure ulcer of sacral region, unstageable: Secondary | ICD-10-CM | POA: Diagnosis not present

## 2014-12-23 DIAGNOSIS — E039 Hypothyroidism, unspecified: Secondary | ICD-10-CM | POA: Diagnosis not present

## 2014-12-23 DIAGNOSIS — S72002D Fracture of unspecified part of neck of left femur, subsequent encounter for closed fracture with routine healing: Secondary | ICD-10-CM | POA: Diagnosis not present

## 2014-12-23 DIAGNOSIS — N184 Chronic kidney disease, stage 4 (severe): Secondary | ICD-10-CM

## 2014-12-23 DIAGNOSIS — I5033 Acute on chronic diastolic (congestive) heart failure: Secondary | ICD-10-CM

## 2014-12-23 DIAGNOSIS — I25810 Atherosclerosis of coronary artery bypass graft(s) without angina pectoris: Secondary | ICD-10-CM | POA: Diagnosis not present

## 2014-12-23 DIAGNOSIS — R600 Localized edema: Secondary | ICD-10-CM

## 2014-12-23 DIAGNOSIS — L89302 Pressure ulcer of unspecified buttock, stage 2: Secondary | ICD-10-CM

## 2014-12-23 DIAGNOSIS — L89620 Pressure ulcer of left heel, unstageable: Secondary | ICD-10-CM

## 2014-12-23 DIAGNOSIS — Z960 Presence of urogenital implants: Secondary | ICD-10-CM

## 2014-12-23 DIAGNOSIS — E870 Hyperosmolality and hypernatremia: Secondary | ICD-10-CM

## 2014-12-23 DIAGNOSIS — H109 Unspecified conjunctivitis: Secondary | ICD-10-CM

## 2014-12-23 NOTE — Progress Notes (Addendum)
Patient ID: [name] [name], male   DOB: 10/27/1921, 81 y.o.   MRN: [id number]   Place of Service: [facility]  Allergies  Allergen Reactions  . Ancef [Cefazolin] Anaphylaxis  . Penicillins Cross Reactors Anaphylaxis and Other (See Comments)    Anaphylaxix/don't give anything in the "family" of penicillins  . Ace Inhibitors Cough  . Sulfa Antibiotics Other (See Comments)    Don't remember. Didn't want to give me anymore    Code Status: DNR  Goals of Care: Comfort and Quality of Life/STR  Chief Complaint  Patient presents with  . Hospitalization Follow-up    HPI  81 y.o. male with complex medical history including HTN, severe pulmonary HTN, CHF, CAD s/p CABG, AS, diverticulitis, afib, prostate ca, GI bleed among many others is being seen for hospitalization follow-up post hospital admission from 12/08/14 to 12/22/14 with closed left hip fracture after a mechanical fall. He underwent left hip hemiarthroplasty by Dr. [name]. He is here for short-term rehabilitation and his goal is to return to his prior level of functioning and return home. Prior to this, he was under [facility]. Seen in room today. Reported having Left hip pain. No other concerns reported.    Review of Systems Constitutional: Negative for fever and chills. HENT: Negative for ear pain and sore throat Eyes: Negative for eye pain and blurry vision Cardiovascular: Negative for chest pain and palpitation Respiratory: Negative for shortness of breath and wheezing.  Gastrointestinal: Negative for nausea and vomiting. Negative for abdominal pain Musculoskeletal: Negative for back pain. Positive for Left hip pain Neurological: Negative for dizziness and headache Skin: Negative for rash and itchyness   Psychiatric: Negative for depression  Past Medical History  Diagnosis Date  . Hypertension   . History of prostate cancer 1996    treated with radiation  . Arthritis   . Gout, arthritis 2010    bil feet  .  Cancer     PORSTATE-HX OF RADIATION AND SURGERY  . Diverticulosis of colon   . CAD (coronary artery disease)     cabg 1996; echo 03/15/2007-EF 45-50%, LA mod to severe dilated, mod TR, mod pulmonary HTN, mod sclerotic aortic valve; myoview6/23/2010- no ischemia  . A-fib     chronic, no longer on coumadin due to rectal bleeding  . Hyperlipidemia   . Rectal bleed   . CHF (congestive heart failure) 10/18/13    Past Surgical History  Procedure Laterality Date  . Coronary artery bypass graft  04/1995    LIMA to LAD, vein graft to diag branch, vein to 3 sequential marginal branches, and vein to PDA  . Appendectomy  1950s  . Eye surgery  2009    cataract surgery  . Cryoablation of prostate  06/23/2005  . Colonoscopy  11/03/2000  . Cardiac catheterization  05/04/2005    patent grafts  . Cardiac catheterization  04/10/1995    presyncopal episode on way to OR, 3V disease with left main involvement, nl LV, CVTS was contacted  . Hip arthroplasty Left 12/09/2014    Procedure: LEFT HIP HEMIARTHROPLASTY;  Surgeon: Mcarthur Rossetti, MD;  Location: [facility];  Service: Orthopedics;  Laterality: Left;    History   Social History  . Marital Status: Single    Spouse Name: N/A  . Number of Children: N/A  . Years of Education: N/A   Occupational History  . Not on file.   Social History Main Topics  . 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 Smokeless tobacco: Never Used  . Alcohol Use: No  . Drug Use: No  . Sexual Activity: No   Other Topics Concern  . Not on file   Social History Narrative      Medication List       This list is accurate as of: 12/23/14 11:59 PM.  Always use your most recent med list.               acetaminophen 325 MG tablet  Commonly known as:  TYLENOL  Take 2 tablets (650 mg total) by mouth every 6 (six) hours as needed.     atorvastatin 10 MG tablet  Commonly known as:  LIPITOR  Take 10 mg by mouth daily.     calcium carbonate 500 MG  chewable tablet  Commonly known as:  TUMS - dosed in mg elemental calcium  Chew 1 tablet by mouth at bedtime.     ciprofloxacin 250 MG tablet  Commonly known as:  CIPRO  Take 1 tablet (250 mg total) by mouth daily at 12 noon.     Digoxin 62.5 MCG Tabs  Take 0.0625 mg by mouth every other day.     diltiazem 120 MG 12 hr capsule  Commonly known as:  CARDIZEM SR  Take 1 capsule (120 mg total) by mouth every 12 (twelve) hours.     feeding supplement (PRO-STAT SUGAR FREE 64) Liqd  Take 30 mLs by mouth 2 (two) times daily with a meal.     heparin 5000 UNIT/ML injection  Inject 1 mL (5,000 Units total) into the skin every 8 (eight) hours.     levalbuterol 0.63 MG/3ML nebulizer solution  Commonly known as:  XOPENEX  Take 3 mLs (0.63 mg total) by nebulization every 3 (three) hours as needed for wheezing or shortness of breath.     levothyroxine 75 MCG tablet  Commonly known as:  SYNTHROID, LEVOTHROID  Take 75 mcg by mouth daily.     multivitamin with minerals Tabs tablet  Take 1 tablet by mouth daily.     polyethylene glycol packet  Commonly known as:  MIRALAX / GLYCOLAX  Take 17 g by mouth daily as needed for mild constipation.        Physical Exam  BP 124/66 mmHg  Pulse 76  Temp(Src) 99.4 F (37.4 C)  Resp 20  Ht 5\' 6"  (1.676 m)  Wt 171 lb (77.565 kg)  BMI 27.61 kg/m2  SpO2 97%  Constitutional: Frail elderly male in no acute distress. Conversant and pleasant HEENT: Normocephalic and atraumatic. PERRL. EOM intact. No sceral icterus. Bil conjuctiva erythematous with thick yellow drainage L>R. Oral mucosa moist dry. Posterior pharynx clear of any exudate or lesions.  Neck: Supple and nontender. No lymphadenopathy, masses, or thyromegaly. No JVD or carotid bruits. Cardiac: Irregularly irregular with 3/6 systolic murmur murmurs, rubs, or gallops. Distal pulses intact. 1-2+ pitting edema of BLE.  Lungs: No respiratory distress. Breath sounds clear bilaterally without rales,  rhonchi, or wheezes. Abdomen: Audible bowel sounds in all quadrants. Soft, nontender, nondistended. Small to moderate amount of frank blood noted in stool.   Musculoskeletal: able to move all extremities. Generalized weakness with limited ROM throughout. Left hip surgical incision erythematous, indurated, tender to touch, with moderate amount of serosanguineous drainage (mostly serous). Staples in place  Skin: Warm and dry. Hyperpigmentation of BLE, consistent with chronic venous insufficiency. BLE also has a few open ulcers. There is a 3x4.5x0cm unstageable ulcer of the Left heel with  100% black eschar. 1.5x1.5cm unstageable pressure ulcer noted over sacral area with yellow slough. Bilateral buttocks has stage 2 pressure ulcer, both measure at 1x1x0.2cm.  Neurological: Alert Psychiatric: Appropriate mood and affect.   Labs Reviewed  CBC Latest Ref Rng 01/08/2015 01/07/2015 01/02/2015  WBC 4.0 - 10.5 K/uL 13.4(H) 11.7 10.0  Hemoglobin 13.0 - 17.0 g/dL 5.3(LL) 7.8(A) 9.7(A)  Hematocrit 39.0 - 52.0 % 17.0(L) 25(A) 31(A)  Platelets 150 - 400 K/uL 201 239 344     CMP Latest Ref Rng 01/08/2015 12/25/2014 12/22/2014  Glucose 70 - 99 mg/dL 147(H) - 111(H)  BUN 6 - 23 mg/dL 62(H) 57(A) 78(H)  Creatinine 0.50 - 1.35 mg/dL 2.93(H) 2.2(A) 2.72(H)  Sodium 135 - 145 mmol/L 144 148(A) 147(H)  Potassium 3.5 - 5.1 mmol/L 5.7(H) 4.5 3.8  Chloride 96 - 112 mmol/L 110 - 110  CO2 19 - 32 mmol/L 28 - 33(H)  Calcium 8.4 - 10.5 mg/dL 8.3(L) - 8.1(L)  Total Protein 6.0 - 8.3 g/dL 5.8(L) - -  Total Bilirubin 0.3 - 1.2 mg/dL 0.4 - -  Alkaline Phos 39 - 117 U/L 129(H) - -  AST 0 - 37 U/L 17 - -  ALT 0 - 53 U/L 13 - -    Assessment & Plan 1. Acute on chronic diastolic heart failure Appears clinically compensated. Continue daily weight. Continue digoxin 62.68mcg every other day. Continue to monitor his status.   2. Closed left hip fracture, with routine healing, subsequent encounter Continue to work with PT/OT for  gait/strength/balance training to restore and maximize function. Continue cipro 250mg  daily x 7 days for possible wound infection. Continue tylenol 650mg  every six hours as needed for pain and heparin 5000units every 8 hours for DVT prophylaxis. F/u with ortho  3. Lower GI bleed Has hx of diverticulitis and GI bleed. Recheck cbc and continue to monitor his status  4. Dyslipidemia Discontinue lipitor secondary to advanced age and goal of care  5. Hypothyroidism, unspecified hypothyroidism type Continue levothyroxine 75mcg daily  6. Anemia, chronic disease Recent h&h 9.2/30.1. Continue to monitor h&h  7. Essential hypertension Stable. Continue diltiazem 120mg  daily and monitor  8. Foley catheter in place on admission Has hx of UI and prostate cancer. Foley was inserted by urology secondary to obstruction. Continue Foley catheter for now. Will have patient f/u with urology. Nursing staff to provide catheter care and record output each shift.   9. Coronary artery disease involving coronary bypass graft of native heart without angina pectoris Denies any chest pain. Continue statin and BP meds. Monitor clinically   10. Atrial fibrillation, unspecified Rate-controlled. Continue digoxin 62.55mcg every other day with diltiazem 120mg  daily. Is no longer on anticoagulant secondary to GI bleed. Continue to monitor  11. Pressure ulcer, heel, left, unstageable Initiate skin/wound care and pressure ulcer precautions (float heels, frequent off-load, and air mattress). Continue to monitor.   12. Sacral pressure ulcer, unstageable See #11  13. Pressure ulcer, buttock, unspecified laterality, stage II See #11  14. Bilateral conjunctivitis Cipro 0.3% 2 gtts in each eye every 2 hours while awake x 2 days, then every four hours x 5 days. Continue to monitor  15. Bilateral leg edema Mostly likely secondary to chronic venous stasis. Encourage elevating BLE while in bed. Start ACE wrap with curlix for  skin protection.   16. CKD stage 4 Avoid nephrotoxic agents. Continue to monitor renal function.  17. Hypernatremia Recheck Bmet. Continue to monitor his status  Diagnostic Studies/Labs Ordered: cbc w/ diff, cmp  Time spent: >60 minutes on care coordination and counseling   Family/Staff Communication Plan of care discussed with resident and nursing staff. Resident and nursing staff verbalized understanding and agree with plan of care. No additional questions or concerns reported.    Arthur Holms, MSN, AGNP-C [facility] [address] [place], [place] [address] [phone] [8am-5pm] After hours: [phone]

## 2014-12-25 ENCOUNTER — Non-Acute Institutional Stay (SKILLED_NURSING_FACILITY): Payer: Medicare Other | Admitting: Internal Medicine

## 2014-12-25 DIAGNOSIS — S72002S Fracture of unspecified part of neck of left femur, sequela: Secondary | ICD-10-CM | POA: Diagnosis not present

## 2014-12-25 DIAGNOSIS — L8995 Pressure ulcer of unspecified site, unstageable: Secondary | ICD-10-CM | POA: Diagnosis not present

## 2014-12-25 DIAGNOSIS — I5032 Chronic diastolic (congestive) heart failure: Secondary | ICD-10-CM

## 2014-12-25 DIAGNOSIS — I4891 Unspecified atrial fibrillation: Secondary | ICD-10-CM

## 2014-12-25 DIAGNOSIS — K922 Gastrointestinal hemorrhage, unspecified: Secondary | ICD-10-CM

## 2014-12-25 DIAGNOSIS — R600 Localized edema: Secondary | ICD-10-CM

## 2014-12-25 DIAGNOSIS — E46 Unspecified protein-calorie malnutrition: Secondary | ICD-10-CM

## 2014-12-25 DIAGNOSIS — E038 Other specified hypothyroidism: Secondary | ICD-10-CM | POA: Diagnosis not present

## 2014-12-25 DIAGNOSIS — R339 Retention of urine, unspecified: Secondary | ICD-10-CM

## 2014-12-25 DIAGNOSIS — D649 Anemia, unspecified: Secondary | ICD-10-CM | POA: Diagnosis not present

## 2014-12-25 LAB — BASIC METABOLIC PANEL WITH GFR
BUN: 57 mg/dL — AB (ref 4–21)
Creatinine: 2.2 mg/dL — AB (ref 0.6–1.3)
Glucose: 92 mg/dL
Potassium: 4.5 mmol/L (ref 3.4–5.3)
Sodium: 148 mmol/L — AB (ref 137–147)

## 2014-12-29 ENCOUNTER — Non-Acute Institutional Stay (SKILLED_NURSING_FACILITY): Payer: Medicare Other | Admitting: Registered Nurse

## 2014-12-29 ENCOUNTER — Encounter: Payer: Self-pay | Admitting: Registered Nurse

## 2014-12-29 DIAGNOSIS — M1A9XX0 Chronic gout, unspecified, without tophus (tophi): Secondary | ICD-10-CM

## 2014-12-29 NOTE — Progress Notes (Signed)
Patient ID: [name] [name], male   DOB: [birth year], 81 y.o.   MRN: [id number]   Place of Service: [facility]  Allergies  Allergen Reactions  . Ancef [Cefazolin] Anaphylaxis  . Ace Inhibitors Cough  . Penicillins Cross Reactors     Anaphylaxix/don't give anything in the "family" of penicillins  . Sulfa Antibiotics     Don't remember. Didn't want to give me anymore    Code Status: DNR  Goals of Care: Comfort and Quality of Life/STR  Chief Complaint  Patient presents with  . Acute Visit    gout    HPI  81 y.o. male with complex medical history including HTN, severe pulmonary HTN, CHF, CAD s/p CABG, AS, diverticulitis, afib, prostate ca, GI bleed, left hip fracture s/p hemiarthroplasty among many others is being seen for an acute visit at the request of nursing staff for the evaluation of possible gout flare. Per patient and daughter, patient has hx of gout and has been taking uloric 40mg  daily for years. Patient and family would like restart uloric to prevent gouty attack. Seen in room today. Patient reported have pain in his LLE, especially knee-has just received a corticosteroid joint injection to his left knee today at his ortho f/u appt. Denies any other concern.   Review of Systems Constitutional: Negative for fever and chills. HENT: Negative for ear pain and sore throat Eyes: Negative for eye pain and blurry vision Cardiovascular: Negative for chest pain and palpitation Respiratory: Negative for shortness of breath and wheezing.  Gastrointestinal: Negative for nausea and vomiting. Negative for abdominal pain Musculoskeletal: Negative for back pain. Positive for LLE pain Neurological: Negative for dizziness and headache Skin: Negative for rash and itchyness   Psychiatric: Negative for depression  Past Medical History  Diagnosis Date  . Hypertension   . History of prostate cancer 1996    treated with radiation  . Arthritis   . Gout, arthritis 2010    bil feet   . Cancer     PORSTATE-HX OF RADIATION AND SURGERY  . Diverticulosis of colon   . CAD (coronary artery disease)     cabg 1996; echo 03/15/2007-EF 45-50%, LA mod to severe dilated, mod TR, mod pulmonary HTN, mod sclerotic aortic valve; myoview6/23/2010- no ischemia  . A-fib     chronic, no longer on coumadin due to rectal bleeding  . Hyperlipidemia   . Rectal bleed   . CHF (congestive heart failure) 10/18/13    Past Surgical History  Procedure Laterality Date  . Coronary artery bypass graft  04/1995    LIMA to LAD, vein graft to diag branch, vein to 3 sequential marginal branches, and vein to PDA  . Appendectomy  1950s  . Eye surgery  2009    cataract surgery  . Cryoablation of prostate  06/23/2005  . Colonoscopy  11/03/2000  . Cardiac catheterization  05/04/2005    patent grafts  . Cardiac catheterization  04/10/1995    presyncopal episode on way to OR, 3V disease with left main involvement, nl LV, CVTS was contacted  . Hip arthroplasty Left 12/09/2014    Procedure: LEFT HIP HEMIARTHROPLASTY;  Surgeon: Mcarthur Rossetti, MD;  Location: [place];  Service: Orthopedics;  Laterality: Left;    History   Social History  . Marital Status: Single    Spouse Name: N/A  . Number of Children: N/A  . Years of Education: N/A   Occupational History  . Not on file.   Social History Main  Topics  . Smoking status: Former Smoker    Quit date: 09/26/1984  . Smokeless tobacco: Never Used  . Alcohol Use: No  . Drug Use: No  . Sexual Activity: No   Other Topics Concern  . Not on file   Social History Narrative      Medication List       This list is accurate as of: 12/29/14  2:49 PM.  Always use your most recent med list.               acetaminophen 325 MG tablet  Commonly known as:  TYLENOL  Take 2 tablets (650 mg total) by mouth every 6 (six) hours as needed.     atorvastatin 10 MG tablet  Commonly known as:  LIPITOR  Take 10 mg by mouth daily.     calcium carbonate 500 MG  chewable tablet  Commonly known as:  TUMS - dosed in mg elemental calcium  Chew 1 tablet by mouth at bedtime.     ciprofloxacin 250 MG tablet  Commonly known as:  CIPRO  Take 1 tablet (250 mg total) by mouth daily at 12 noon.     Digoxin 62.5 MCG Tabs  Take 0.0625 mg by mouth every other day.     diltiazem 120 MG 12 hr capsule  Commonly known as:  CARDIZEM SR  Take 1 capsule (120 mg total) by mouth every 12 (twelve) hours.     feeding supplement (PRO-STAT SUGAR FREE 64) Liqd  Take 30 mLs by mouth 2 (two) times daily with a meal.     heparin 5000 UNIT/ML injection  Inject 1 mL (5,000 Units total) into the skin every 8 (eight) hours.     levalbuterol 0.63 MG/3ML nebulizer solution  Commonly known as:  XOPENEX  Take 3 mLs (0.63 mg total) by nebulization every 3 (three) hours as needed for wheezing or shortness of breath.     levothyroxine 75 MCG tablet  Commonly known as:  SYNTHROID, LEVOTHROID  Take 75 mcg by mouth daily.     multivitamin with minerals Tabs tablet  Take 1 tablet by mouth daily.     polyethylene glycol packet  Commonly known as:  MIRALAX / GLYCOLAX  Take 17 g by mouth daily as needed for mild constipation.        Physical Exam  BP 135/60 mmHg  Pulse 71  Temp(Src) 98 F (36.7 C)  Resp 19  Ht 5\' 11"  (1.803 m)  Wt 169 lb 12.8 oz (77.021 kg)  BMI 23.69 kg/m2  SpO2 97%  Constitutional: Frail elderly male in no acute distress. Conversant and pleasant HEENT: Normocephalic and atraumatic. PERRL. EOM intact. No sceral icterus. Posterior pharynx clear of any exudate or lesions.  Neck: Supple and nontender. No lymphadenopathy, masses, or thyromegaly. No JVD or carotid bruits. Cardiac: Irregularly irregular with 3/6 systolic murmur murmurs, rubs, or gallops. Distal pulses intact. 1+ pitting edema of BLE.  Lungs: No respiratory distress. Breath sounds clear bilaterally without rales, rhonchi, or wheezes. Abdomen: Audible bowel sounds in all quadrants. Soft,  nontender, nondistended. Musculoskeletal: able to move all extremities. Generalized weakness with limited ROM throughout. Left knee slightly edematous and tender to palpation.  Skin: Warm and dry.  Neurological: Alert Psychiatric: Appropriate mood and affect.   Labs Reviewed  CBC Latest Ref Rng 12/21/2014 12/20/2014 12/18/2014  WBC 4.0 - 10.5 K/uL 8.4 9.0 10.2  Hemoglobin 13.0 - 17.0 g/dL 9.2(L) 9.2(L) 8.7(L)  Hematocrit 39.0 - 52.0 % 30.1(L) 29.8(L) 27.6(L)  Platelets 150 - 400 K/uL 252 201 165     CMP Latest Ref Rng 12/22/2014 12/21/2014 12/20/2014  Glucose 70 - 99 mg/dL 111(H) 101(H) 107(H)  BUN 6 - 23 mg/dL 78(H) 84(H) 91(H)  Creatinine 0.50 - 1.35 mg/dL 2.72(H) 2.98(H) 3.07(H)  Sodium 135 - 145 mmol/L 147(H) 146(H) 147(H)  Potassium 3.5 - 5.1 mmol/L 3.8 3.6 3.5  Chloride 96 - 112 mmol/L 110 107 104  CO2 19 - 32 mmol/L 33(H) 37(H) 34(H)  Calcium 8.4 - 10.5 mg/dL 8.1(L) 8.5 8.4  Total Protein 6.0 - 8.3 g/dL - - -  Total Bilirubin 0.3 - 1.2 mg/dL - - -  Alkaline Phos 39 - 117 U/L - - -  AST 0 - 37 U/L - - -  ALT 0 - 53 U/L - - -    Assessment & Plan 1. Chronic gout without tophus, unspecified cause, unspecified site Restart uloric 40mg  daily per family request. Continue tylenol 650mg  every six hours as needed with norco 5/325mg  1/2-1 tab every six hours as needed for pain. Continue to monitor.   Family/Staff Communication Plan of care discussed with resident and nursing staff. Resident and nursing staff verbalized understanding and agree with plan of care. No additional questions or concerns reported.    Arthur Holms, MSN, AGNP-C [facility] [address] [place], [place] [address] [phone] [8am-5pm] After hours: [phone]

## 2015-01-02 ENCOUNTER — Non-Acute Institutional Stay (SKILLED_NURSING_FACILITY): Payer: Medicare Other | Admitting: Registered Nurse

## 2015-01-02 DIAGNOSIS — R319 Hematuria, unspecified: Secondary | ICD-10-CM

## 2015-01-02 LAB — CBC AND DIFFERENTIAL
HCT: 31 % — AB (ref 41–53)
Hemoglobin: 9.7 g/dL — AB (ref 13.5–17.5)
PLATELETS: 344 10*3/uL (ref 150–399)
WBC: 10 10^3/mL

## 2015-01-03 NOTE — Progress Notes (Signed)
Patient ID: [name] [name], male   DOB: November 06, 1920, 81 y.o.   MRN: [id number]   Place of Service: [facility]  Allergies  Allergen Reactions  . Ancef [Cefazolin] Anaphylaxis  . Ace Inhibitors Cough  . Penicillins Cross Reactors     Anaphylaxix/don't give anything in the "family" of penicillins  . Sulfa Antibiotics     Don't remember. Didn't want to give me anymore    Code Status: DNR  Goals of Care: Comfort and Quality of Life/STR  Chief Complaint  Patient presents with  . Acute Visit    hematuria    HPI  81 y.o. male with complex medical history including HTN, severe pulmonary HTN, CHF, CAD s/p CABG, AS, diverticulitis, afib, prostate ca, GI bleed, left hip fracture s/p hemiarthroplasty among many others is being seen for an acute visit at the request of nursing supervisior for the evaluation of bloody urine in Foley bag, concerning for bleeding as patient is currently on Heparin three times daily for DVT prophylaxis. Review of nursing's notes showed patient has had blood-tinged urine since his Foley catheter was changed by urology. Seen in room today, denies any concerns.   Review of Systems Constitutional: Negative for fever and chills. HEENT: Negative for eye pain and blurry vision Cardiovascular: Negative for chest pain and palpitation Respiratory: Negative for shortness of breath and wheezing.  Gastrointestinal: Negative for nausea and vomiting. Negative for abdominal pain Neurological: Negative for dizziness and headache    Past Medical History  Diagnosis Date  . Hypertension   . History of prostate cancer 1996    treated with radiation  . Arthritis   . Gout, arthritis 2010    bil feet  . Cancer     PORSTATE-HX OF RADIATION AND SURGERY  . Diverticulosis of colon   . CAD (coronary artery disease)     cabg 1996; echo 03/15/2007-EF 45-50%, LA mod to severe dilated, mod TR, mod pulmonary HTN, mod sclerotic aortic valve; myoview6/23/2010- no ischemia  .  A-fib     chronic, no longer on coumadin due to rectal bleeding  . Hyperlipidemia   . Rectal bleed   . CHF (congestive heart failure) 10/18/13    Past Surgical History  Procedure Laterality Date  . Coronary artery bypass graft  04/1995    LIMA to LAD, vein graft to diag branch, vein to 3 sequential marginal branches, and vein to PDA  . Appendectomy  1950s  . Eye surgery  2009    cataract surgery  . Cryoablation of prostate  06/23/2005  . Colonoscopy  11/03/2000  . Cardiac catheterization  05/04/2005    patent grafts  . Cardiac catheterization  04/10/1995    presyncopal episode on way to OR, 3V disease with left main involvement, nl LV, CVTS was contacted  . Hip arthroplasty Left 12/09/2014    Procedure: LEFT HIP HEMIARTHROPLASTY;  Surgeon: Mcarthur Rossetti, MD;  Location: [place];  Service: Orthopedics;  Laterality: Left;    History   Social History  . Marital Status: Single    Spouse Name: N/A  . Number of Children: N/A  . Years of Education: N/A   Occupational History  . Not on file.   Social History Main Topics  . Smoking status: Former Smoker    Quit date: 09/26/1984  . Smokeless tobacco: Never Used  . Alcohol Use: No  . Drug Use: No  . Sexual Activity: No   Other Topics Concern  . Not on file   Social  History Narrative      Medication List       This list is accurate as of: 01/02/15 11:59 PM.  Always use your most recent med list.               acetaminophen 325 MG tablet  Commonly known as:  TYLENOL  Take 2 tablets (650 mg total) by mouth every 6 (six) hours as needed.     atorvastatin 10 MG tablet  Commonly known as:  LIPITOR  Take 10 mg by mouth daily.     calcium carbonate 500 MG chewable tablet  Commonly known as:  TUMS - dosed in mg elemental calcium  Chew 1 tablet by mouth at bedtime.     ciprofloxacin 250 MG tablet  Commonly known as:  CIPRO  Take 1 tablet (250 mg total) by mouth daily at 12 noon.     Digoxin 62.5 MCG Tabs  Take 0.0625  mg by mouth every other day.     diltiazem 120 MG 12 hr capsule  Commonly known as:  CARDIZEM SR  Take 1 capsule (120 mg total) by mouth every 12 (twelve) hours.     feeding supplement (PRO-STAT SUGAR FREE 64) Liqd  Take 30 mLs by mouth 2 (two) times daily with a meal.     heparin 5000 UNIT/ML injection  Inject 1 mL (5,000 Units total) into the skin every 8 (eight) hours.     levalbuterol 0.63 MG/3ML nebulizer solution  Commonly known as:  XOPENEX  Take 3 mLs (0.63 mg total) by nebulization every 3 (three) hours as needed for wheezing or shortness of breath.     levothyroxine 75 MCG tablet  Commonly known as:  SYNTHROID, LEVOTHROID  Take 75 mcg by mouth daily.     multivitamin with minerals Tabs tablet  Take 1 tablet by mouth daily.     polyethylene glycol packet  Commonly known as:  MIRALAX / GLYCOLAX  Take 17 g by mouth daily as needed for mild constipation.        Physical Exam  BP 145/65 mmHg  Pulse 78  Temp(Src) 98.2 F (36.8 C)  Resp 20  SpO2 97%  Constitutional: Frail elderly male in no acute distress. Conversant and pleasant HEENT: Normocephalic and atraumatic. PERRL. EOM intact. No scleral icterus. Oral mucosa dry. Posterior pharynx clear of any exudate or lesions.  Neck: No JVD or carotid bruits. Cardiac: Irregularly irregular with 3/6 systolic murmur. Distal pulses intact. 1+ pitting edema of BLE.  Lungs: No respiratory distress. Breath sounds clear bilaterally without rales, rhonchi, or wheezes. Abdomen: Audible bowel sounds in all quadrants. Soft, nontender, nondistended. Musculoskeletal: able to move all extremities. Generalized weakness with limited ROM throughout.  GU: Foley cath in place. Blood-tinged urine noted.  Skin: Warm and dry.  Neurological: Alert Psychiatric: Appropriate mood and affect.   Labs Reviewed  CBC Latest Ref Rng 12/21/2014 12/20/2014 12/18/2014  WBC 4.0 - 10.5 K/uL 8.4 9.0 10.2  Hemoglobin 13.0 - 17.0 g/dL 9.2(L) 9.2(L) 8.7(L)    Hematocrit 39.0 - 52.0 % 30.1(L) 29.8(L) 27.6(L)  Platelets 150 - 400 K/uL 252 201 165     CMP Latest Ref Rng 12/22/2014 12/21/2014 12/20/2014  Glucose 70 - 99 mg/dL 111(H) 101(H) 107(H)  BUN 6 - 23 mg/dL 78(H) 84(H) 91(H)  Creatinine 0.50 - 1.35 mg/dL 2.72(H) 2.98(H) 3.07(H)  Sodium 135 - 145 mmol/L 147(H) 146(H) 147(H)  Potassium 3.5 - 5.1 mmol/L 3.8 3.6 3.5  Chloride 96 - 112 mmol/L 110 107 104  CO2 19 - 32 mmol/L 33(H) 37(H) 34(H)  Calcium 8.4 - 10.5 mg/dL 8.1(L) 8.5 8.4  Total Protein 6.0 - 8.3 g/dL - - -  Total Bilirubin 0.3 - 1.2 mg/dL - - -  Alkaline Phos 39 - 117 U/L - - -  AST 0 - 37 U/L - - -  ALT 0 - 53 U/L - - -    Assessment & Plan 1. Hematuria VS stable. Asymptomatic. Mostly like secondary to recent Foley catheter replacement. Repeat CBC and continue to monitor.   Labs ordered: CBC  Family/Staff Communication Plan of care discussed with patient and nursing supervisor. Nursing supervisor verbalized understanding and agree with plan of care. No additional questions or concerns reported.   Arthur Holms, MSN, AGNP-C [facility] [address] [place], [place] [address] [phone] [8am-5pm] After hours: [phone]

## 2015-01-04 NOTE — Progress Notes (Signed)
Patient ID: HAMAD WHYTE, male   DOB: 10/15/1921, 81 y.o.   MRN: [id number]     Facility: [facility]    PCP: [name], MD  Code Status: DNR  Allergies  Allergen Reactions  . Ancef [Cefazolin] Anaphylaxis  . Ace Inhibitors Cough  . Penicillins Cross Reactors     Anaphylaxix/don't give anything in the "family" of penicillins  . Sulfa Antibiotics     Don't remember. Didn't want to give me anymore    Chief Complaint  Patient presents with  . New Admit To SNF     HPI:  81 year old patient is here for short term rehabilitation post hospital admission from 12/08/14 to 12/22/14 with closed left hip fracture after a mechanical fall. He underwent left hip hemiarthroplasty by Dr. [name]. He also received 3 u prbc transfusion for blood loss anemia. He has medical history of HTN, severe pulmonary HTN, CHF, CAD s/p CABG, AS, diverticulitis, afib, prostate ca, GI bleed.   He is seen in room today. Pain is under control with current regimen. Denies any concerns.  Review of Systems:  Constitutional: Negative for fever, chills, diaphoresis.  HENT: Negative for headache, congestion, nasal discharge Respiratory: Negative for cough, shortness of breath and wheezing.   Cardiovascular: Negative for chest pain, palpitations, leg swelling.  Gastrointestinal: Negative for heartburn, nausea, vomiting, abdominal pain Genitourinary: Negative for dysuria Musculoskeletal: Negative for back pain, falls in facility Skin: Negative for itching, rash.  Neurological: Negative for dizziness, tingling, focal weakness Psychiatric/Behavioral: Negative for depression    Past Medical History  Diagnosis Date  . Hypertension   . History of prostate cancer 1996    treated with radiation  . Arthritis   . Gout, arthritis 2010    bil feet  . Cancer     PORSTATE-HX OF RADIATION AND SURGERY  . Diverticulosis of colon   . CAD (coronary artery disease)     cabg 1996; echo  03/15/2007-EF 45-50%, LA mod to severe dilated, mod TR, mod pulmonary HTN, mod sclerotic aortic valve; myoview6/23/2010- no ischemia  . A-fib     chronic, no longer on coumadin due to rectal bleeding  . Hyperlipidemia   . Rectal bleed   . CHF (congestive heart failure) 10/18/13   Past Surgical History  Procedure Laterality Date  . Coronary artery bypass graft  04/1995    LIMA to LAD, vein graft to diag branch, vein to 3 sequential marginal branches, and vein to PDA  . Appendectomy  1950s  . Eye surgery  2009    cataract surgery  . Cryoablation of prostate  06/23/2005  . Colonoscopy  11/03/2000  . Cardiac catheterization  05/04/2005    patent grafts  . Cardiac catheterization  04/10/1995    presyncopal episode on way to OR, 3V disease with left main involvement, nl LV, CVTS was contacted  . Hip arthroplasty Left 12/09/2014    Procedure: LEFT HIP HEMIARTHROPLASTY;  Surgeon: Mcarthur Rossetti, MD;  Location: [place];  Service: Orthopedics;  Laterality: Left;   Social History:   reports that he quit smoking about 30 years ago. He has never used smokeless tobacco. He reports that he does not drink alcohol or use illicit drugs.  Family History  Problem Relation Age of Onset  . Heart Problems Mother   . Heart Problems Father   . Cancer Brother   . Cancer Sister     Medications: Patient's Medications  New Prescriptions   No medications on file  Previous Medications   ACETAMINOPHEN (TYLENOL) 325 MG TABLET    Take 2 tablets (650 mg total) by mouth every 6 (six) hours as needed.   AMINO ACIDS-PROTEIN HYDROLYS (FEEDING SUPPLEMENT, PRO-STAT SUGAR FREE 64,) LIQD    Take 30 mLs by mouth 2 (two) times daily with a meal.   ATORVASTATIN (LIPITOR) 10 MG TABLET    Take 10 mg by mouth daily.   CALCIUM CARBONATE (TUMS - DOSED IN MG ELEMENTAL CALCIUM) 500 MG CHEWABLE TABLET    Chew 1 tablet by mouth at bedtime.    CIPROFLOXACIN (CIPRO) 250 MG TABLET    Take 1 tablet (250 mg total) by mouth daily at 12  noon.   DIGOXIN 62.5 MCG TABS    Take 0.0625 mg by mouth every other day.   DILTIAZEM (CARDIZEM SR) 120 MG 12 HR CAPSULE    Take 1 capsule (120 mg total) by mouth every 12 (twelve) hours.   HEPARIN 5000 UNIT/ML INJECTION    Inject 1 mL (5,000 Units total) into the skin every 8 (eight) hours.   LEVALBUTEROL (XOPENEX) 0.63 MG/3ML NEBULIZER SOLUTION    Take 3 mLs (0.63 mg total) by nebulization every 3 (three) hours as needed for wheezing or shortness of breath.   LEVOTHYROXINE (SYNTHROID, LEVOTHROID) 75 MCG TABLET    Take 75 mcg by mouth daily.   MULTIPLE VITAMIN (MULTIVITAMIN WITH MINERALS) TABS TABLET    Take 1 tablet by mouth daily.   POLYETHYLENE GLYCOL (MIRALAX / GLYCOLAX) PACKET    Take 17 g by mouth daily as needed for mild constipation.  Modified Medications   No medications on file  Discontinued Medications   No medications on file     Physical Exam: Filed Vitals:   12/25/14 1551  BP: 108/60  Pulse: 78  Temp: 97.2 F (36.2 C)  Resp: 18  SpO2: 97%    General- elderly male, frail, in no acute distress Head- normocephalic, atraumatic Throat- moist mucus membrane Neck- no cervical lymphadenopathy Cardiovascular- irregular hear rate, systolic murmur present, distal pulses intact Respiratory- bilateral decreased air entry with rhonchi present Abdomen- bowel sounds present, soft, non tender Musculoskeletal- able to move all 4 extremities, left leg rom limited at the hip, leg edema, ace wrap in place Neurological- no focal deficit Skin- warm and dry, multiple pressure ulcer- 2 unstageable in sacrum and left heel, venous stasis in both legs, staples in place at surgical incision site with some serous drainage, erythema at incision site but as per wound nurse this has improved from admission Psychiatry- alert and oriented to person, place and time, normal mood and affect    Labs reviewed: Basic Metabolic Panel:  Recent Labs  12/14/14 0910 12/15/14 0550  12/18/14 1644   12/20/14 0100 12/21/14 0504 12/22/14 0451  NA  --  149*  < >  --   < > 147* 146* 147*  K  --  4.1  < > 3.6  < > 3.5 3.6 3.8  CL  --  112  < >  --   < > 104 107 110  CO2  --  28  < >  --   < > 34* 37* 33*  GLUCOSE  --  111*  < >  --   < > 107* 101* 111*  BUN  --  101*  < >  --   < > 91* 84* 78*  CREATININE  --  4.42*  < >  --   < > 3.07* 2.98* 2.72*  CALCIUM  --  8.5  < >  --   < > 8.4 8.5 8.1*  MG 1.8 1.9  --  1.7  --   --   --   --   < > = values in this interval not displayed. Liver Function Tests:  Recent Labs  12/11/14 0400 12/14/14 1930 12/15/14 0550 12/16/14 0500  AST 95*  --  40* 31  ALT 79*  --  57* 45  ALKPHOS 72  --  77 72  BILITOT 0.8  --  1.7* 1.5*  PROT 5.5*  --  6.3 6.1  ALBUMIN 2.1* 2.1* 1.9* 1.9*   No results for input(s): LIPASE, AMYLASE in the last 8760 hours. No results for input(s): AMMONIA in the last 8760 hours. CBC:  Recent Labs  12/11/14 0400  12/14/14 0910 12/14/14 1930  12/18/14 0500 12/20/14 0100 12/21/14 0504  WBC 9.7  < > 10.2 9.9  < > 10.2 9.0 8.4  NEUTROABS 8.2*  --  8.8* 8.2*  --   --   --   --   HGB 8.4*  < > 7.5* 7.9*  < > 8.7* 9.2* 9.2*  HCT 26.1*  < > 23.9* 25.3*  < > 27.6* 29.8* 30.1*  MCV 87.3  < > 88.5 87.8  < > 89.3 89.0 89.1  PLT 127*  < > 127* 141*  < > 165 201 252  < > = values in this interval not displayed. Cardiac Enzymes:  Recent Labs  12/10/14 0144 12/10/14 0422 12/10/14 0644  TROPONINI 1.44* 1.42* 1.37*   BNP: Invalid input(s): POCBNP CBG:  Recent Labs  12/11/14 1127 12/11/14 1549 12/11/14 1914  GLUCAP 136* 136* 123*    Assessment/Plan  Closed left hip fracture S/p left hip arthroplasty. Will have him work with physical therapy and occupational therapy team to help with gait training and muscle strengthening exercises.fall precautions. Skin care. Encourage to be out of bed. Continue and complete course of ciprofloxacin for possible surgical incision site infection. Continue tylenol 650mg  q6h prn  for pain and heparin for DVT prophylaxis. To follow up with orthopedics.  Lower GI bleed Has hx of diverticulitis and GI bleed. S/p 3 u prbc transfusion in hospital. Recheck cbc  Anemia Has chronic disease along with recent acute blood loss. Monitor h&h  Urinary retention Continue Foley catheter for now and to follow up with urology. Has ckd stage 4  afib Rate controlled. Continue diltiazem 120 mg daily and digoxin 62.45mcg every other day.   Pressure ulcer Continue wound care and pressure ulcer prophylaxis.   Malnutrition Encourage po intake. Continue protein supplements and skin care. Pressure ulcer prophylaxis. Continue mechanical soft diet. To be followed by SLP team.  Bilateral leg edema From chronic venous stasis. Encourage leg elevation at rest and ace wraps  Chronic diastolic heart failure Appears clinically compensated. Continue daily weight.   Hypothyroidism Continue levothyroxine 77mcg daily   Goals of care: short term rehabilitation   Labs/tests ordered: cbc with diff, cmp  Family/ staff Communication: reviewed care plan with patient and nursing supervisor    Blanchie Serve, MD  [facility] [phone] (Monday-Friday 8 am - 5 pm) [phone] (afterhours)

## 2015-01-07 ENCOUNTER — Non-Acute Institutional Stay (SKILLED_NURSING_FACILITY): Payer: Medicare Other | Admitting: Registered Nurse

## 2015-01-07 DIAGNOSIS — K922 Gastrointestinal hemorrhage, unspecified: Secondary | ICD-10-CM

## 2015-01-07 LAB — CBC AND DIFFERENTIAL
HCT: 25 % — AB (ref 41–53)
Hemoglobin: 7.8 g/dL — AB (ref 13.5–17.5)
Platelets: 239 10*3/uL (ref 150–399)
WBC: 11.7 10^3/mL

## 2015-01-07 LAB — POCT INR: INR: 1.2 — AB (ref 0.9–1.1)

## 2015-01-07 NOTE — Progress Notes (Addendum)
Patient ID: [name] [name], male   DOB: [birth year], 81 y.o.   MRN: [id number]   Place of Service: [facility]  Allergies  Allergen Reactions  . Ancef [Cefazolin] Anaphylaxis  . Ace Inhibitors Cough  . Penicillins Cross Reactors     Anaphylaxix/don't give anything in the "family" of penicillins  . Sulfa Antibiotics     Don't remember. Didn't want to give me anymore    Code Status: DNR  Goals of Care: Comfort and Quality of Life/STR  Chief Complaint  Patient presents with  . Acute Visit    GI bleed    HPI  81 y.o. male with complex medical history including HTN, severe pulmonary HTN, CHF, CAD s/p CABG, AS, diverticulosis, afib, prostate ca, GI bleed, left hip fracture s/p hemiarthroplasty among many others is being seen for an acute visit at the request of nursing staff for worsening of rectal bleeding over the past couple days. Has had two large bloody bowel movements with huge clots. His 6am heparin dose was on hold by on-call provider last night. VS remains stable. His h&h on 3/4 was 9.7/31. Seen in room today. Denies any concerns. I called Dr. [name], ortho to discuss about discontinuation of Heparin. He stated that the patient does not need to be on blood thinner from his standpoint. He was not the person who started the patient on Heparin.   Review of Systems Constitutional: Negative for fever and chills. HEENT: Negative for eye pain and blurry vision Cardiovascular: Negative for chest pain and palpitation Respiratory: Negative for shortness of breath and wheezing.  Gastrointestinal: Negative for nausea and vomiting. Negative for abdominal pain Neurological: Negative for dizziness and headache    Past Medical History  Diagnosis Date  . Hypertension   . History of prostate cancer 1996    treated with radiation  . Arthritis   . Gout, arthritis 2010    bil feet  . Cancer     PORSTATE-HX OF RADIATION AND SURGERY  . Diverticulosis of colon   . CAD (coronary  artery disease)     cabg 1996; echo 03/15/2007-EF 45-50%, LA mod to severe dilated, mod TR, mod pulmonary HTN, mod sclerotic aortic valve; myoview6/23/2010- no ischemia  . A-fib     chronic, no longer on coumadin due to rectal bleeding  . Hyperlipidemia   . Rectal bleed   . CHF (congestive heart failure) 10/18/13    Past Surgical History  Procedure Laterality Date  . Coronary artery bypass graft  04/1995    LIMA to LAD, vein graft to diag branch, vein to 3 sequential marginal branches, and vein to PDA  . Appendectomy  1950s  . Eye surgery  2009    cataract surgery  . Cryoablation of prostate  06/23/2005  . Colonoscopy  11/03/2000  . Cardiac catheterization  05/04/2005    patent grafts  . Cardiac catheterization  04/10/1995    presyncopal episode on way to OR, 3V disease with left main involvement, nl LV, CVTS was contacted  . Hip arthroplasty Left 12/09/2014    Procedure: LEFT HIP HEMIARTHROPLASTY;  Surgeon: Mcarthur Rossetti, MD;  Location: [place];  Service: Orthopedics;  Laterality: Left;    History   Social History  . Marital Status: Single    Spouse Name: N/A  . Number of Children: N/A  . Years of Education: N/A   Occupational History  . Not on file.   Social History Main Topics  . Smoking status: Former Smoker  Quit date: 09/26/1984  . Smokeless tobacco: Never Used  . Alcohol Use: No  . Drug Use: No  . Sexual Activity: No   Other Topics Concern  . Not on file   Social History Narrative      Medication List       This list is accurate as of: 01/07/15 12:59 PM.  Always use your most recent med list.               acetaminophen 325 MG tablet  Commonly known as:  TYLENOL  Take 2 tablets (650 mg total) by mouth every 6 (six) hours as needed.     atorvastatin 10 MG tablet  Commonly known as:  LIPITOR  Take 10 mg by mouth daily.     calcium carbonate 500 MG chewable tablet  Commonly known as:  TUMS - dosed in mg elemental calcium  Chew 1 tablet by mouth  at bedtime.     Digoxin 62.5 MCG Tabs  Take 0.0625 mg by mouth every other day.     diltiazem 120 MG 12 hr capsule  Commonly known as:  CARDIZEM SR  Take 1 capsule (120 mg total) by mouth every 12 (twelve) hours.     feeding supplement (PRO-STAT SUGAR FREE 64) Liqd  Take 30 mLs by mouth 2 (two) times daily with a meal.     levalbuterol 0.63 MG/3ML nebulizer solution  Commonly known as:  XOPENEX  Take 3 mLs (0.63 mg total) by nebulization every 3 (three) hours as needed for wheezing or shortness of breath.     levothyroxine 75 MCG tablet  Commonly known as:  SYNTHROID, LEVOTHROID  Take 75 mcg by mouth daily.     multivitamin with minerals Tabs tablet  Take 1 tablet by mouth daily.     pantoprazole 40 MG tablet  Commonly known as:  PROTONIX  Take 40 mg by mouth 2 (two) times daily.     polyethylene glycol packet  Commonly known as:  MIRALAX / GLYCOLAX  Take 17 g by mouth daily as needed for mild constipation.        Physical Exam  BP 148/78 mmHg  Pulse 95  Temp(Src) 97.1 F (36.2 C)  Resp 20  Constitutional: Frail elderly male in no acute distress. Conversant and pleasant HEENT: Normocephalic and atraumatic. PERRL. EOM intact. No scleral icterus. Oral mucosa dry. Posterior pharynx clear of any exudate or lesions.  Neck: No JVD or carotid bruits. Cardiac: Irregularly irregular with 3/6 systolic murmur. Distal pulses intact. 1+ pitting edema of BLE.  Lungs: No respiratory distress. Breath sounds clear bilaterally without rales, rhonchi, or wheezes. Abdomen: Audible bowel sounds in all quadrants. Soft, nontender, nondistended. Large pool of coagulated blood noted in incontinent brief. Musculoskeletal: able to move all extremities. Generalized weakness with limited ROM throughout.  GU: Foley cath in place. Blood-tinged urine noted.  Skin: Warm and dry.  Neurological: Alert Psychiatric: Appropriate mood and affect.   Labs Reviewed  CBC Latest Ref Rng 01/07/2015 01/02/2015  12/21/2014  WBC - 11.7 10.0 8.4  Hemoglobin 13.5 - 17.5 g/dL 7.8(A) 9.7(A) 9.2(L)  Hematocrit 41 - 53 % 25(A) 31(A) 30.1(L)  Platelets 150 - 399 K/L 239 344 252     CMP Latest Ref Rng 12/25/2014 12/22/2014 12/21/2014  Glucose 70 - 99 mg/dL - 111(H) 101(H)  BUN 4 - 21 mg/dL 57(A) 78(H) 84(H)  Creatinine 0.6 - 1.3 mg/dL 2.2(A) 2.72(H) 2.98(H)  Sodium 137 - 147 mmol/L 148(A) 147(H) 146(H)  Potassium 3.4 -  5.3 mmol/L 4.5 3.8 3.6  Chloride 96 - 112 mmol/L - 110 107  CO2 19 - 32 mmol/L - 33(H) 37(H)  Calcium 8.4 - 10.5 mg/dL - 8.1(L) 8.5  Total Protein 6.0 - 8.3 g/dL - - -  Total Bilirubin 0.3 - 1.2 mg/dL - - -  Alkaline Phos 39 - 117 U/L - - -  AST 0 - 37 U/L - - -  ALT 0 - 53 U/L - - -    Assessment & Plan 1. Gastrointestinal hemorrhage, unspecified gastritis, unspecified gastrointestinal hemorrhage type Clinically stable. Most likely colonic diverticular bleeding. Not a candidate for colonoscopy given complex medical issues. INR 1.2 today. Will discontinue heparin. Stat CBC. H&h now 7.8/25. Start protonix 40mg  twice daily for now. Monitor VS every four hours. Repeat CBC in the morning. Will transfuse if Hgb <7. Continue to monitor his status.   Spoke to daughters today about his poor prognosis and poor progress in therapy. Recommended LTC and resuming Hospice care. They also brought his prescription for budesonide 3mg -3 capsules daily for IBD. Will resume this per family request. I asked if they would like for him to be sent to the hospital if he becomes hemodynamically unstable/symptomatics-both daughters agree on "no hospitalization"  Time spent: over 25 minutes on care coordination counseling   Family/Staff Communication Plan of care discussed with patient, daughter, and nursing supervisor. Patient and nursing supervisor verbalized understanding and agree with plan of care. No additional questions or concerns reported.   Arthur Holms, MSN, AGNP-C [facility] [place], [place] [address] [phone] [8am-5pm] After hours: [phone]

## 2015-01-08 ENCOUNTER — Encounter (HOSPITAL_COMMUNITY): Payer: Self-pay | Admitting: *Deleted

## 2015-01-08 ENCOUNTER — Emergency Department (HOSPITAL_COMMUNITY)
Admission: EM | Admit: 2015-01-08 | Discharge: 2015-01-09 | Disposition: A | Discharge status: Home / Self Care | Payer: Medicare Other | Attending: Emergency Medicine | Admitting: Emergency Medicine

## 2015-01-08 ENCOUNTER — Non-Acute Institutional Stay (SKILLED_NURSING_FACILITY): Payer: Medicare Other | Admitting: Registered Nurse

## 2015-01-08 ENCOUNTER — Encounter: Payer: Self-pay | Admitting: Registered Nurse

## 2015-01-08 DIAGNOSIS — Z880 Allergy status to penicillin: Secondary | ICD-10-CM | POA: Insufficient documentation

## 2015-01-08 DIAGNOSIS — I509 Heart failure, unspecified: Secondary | ICD-10-CM | POA: Insufficient documentation

## 2015-01-08 DIAGNOSIS — R195 Other fecal abnormalities: Secondary | ICD-10-CM | POA: Diagnosis not present

## 2015-01-08 DIAGNOSIS — Z951 Presence of aortocoronary bypass graft: Secondary | ICD-10-CM | POA: Diagnosis not present

## 2015-01-08 DIAGNOSIS — E785 Hyperlipidemia, unspecified: Secondary | ICD-10-CM | POA: Diagnosis not present

## 2015-01-08 DIAGNOSIS — D500 Iron deficiency anemia secondary to blood loss (chronic): Secondary | ICD-10-CM | POA: Diagnosis not present

## 2015-01-08 DIAGNOSIS — M1990 Unspecified osteoarthritis, unspecified site: Secondary | ICD-10-CM | POA: Diagnosis not present

## 2015-01-08 DIAGNOSIS — Z8719 Personal history of other diseases of the digestive system: Secondary | ICD-10-CM

## 2015-01-08 DIAGNOSIS — I10 Essential (primary) hypertension: Secondary | ICD-10-CM | POA: Diagnosis not present

## 2015-01-08 DIAGNOSIS — Z79899 Other long term (current) drug therapy: Secondary | ICD-10-CM | POA: Diagnosis not present

## 2015-01-08 DIAGNOSIS — Z9089 Acquired absence of other organs: Secondary | ICD-10-CM | POA: Diagnosis not present

## 2015-01-08 DIAGNOSIS — K922 Gastrointestinal hemorrhage, unspecified: Secondary | ICD-10-CM

## 2015-01-08 DIAGNOSIS — Z9889 Other specified postprocedural states: Secondary | ICD-10-CM | POA: Diagnosis not present

## 2015-01-08 DIAGNOSIS — K625 Hemorrhage of anus and rectum: Secondary | ICD-10-CM | POA: Diagnosis present

## 2015-01-08 DIAGNOSIS — I2510 Atherosclerotic heart disease of native coronary artery without angina pectoris: Secondary | ICD-10-CM | POA: Insufficient documentation

## 2015-01-08 DIAGNOSIS — Z87891 Personal history of nicotine dependence: Secondary | ICD-10-CM | POA: Diagnosis not present

## 2015-01-08 DIAGNOSIS — Z8546 Personal history of malignant neoplasm of prostate: Secondary | ICD-10-CM | POA: Insufficient documentation

## 2015-01-08 DIAGNOSIS — D62 Acute posthemorrhagic anemia: Secondary | ICD-10-CM | POA: Diagnosis not present

## 2015-01-08 LAB — COMPREHENSIVE METABOLIC PANEL
ALBUMIN: 2 g/dL — AB (ref 3.5–5.2)
ALT: 13 U/L (ref 0–53)
ANION GAP: 6 (ref 5–15)
AST: 17 U/L (ref 0–37)
Alkaline Phosphatase: 129 U/L — ABNORMAL HIGH (ref 39–117)
BILIRUBIN TOTAL: 0.4 mg/dL (ref 0.3–1.2)
BUN: 62 mg/dL — AB (ref 6–23)
CALCIUM: 8.3 mg/dL — AB (ref 8.4–10.5)
CO2: 28 mmol/L (ref 19–32)
CREATININE: 2.93 mg/dL — AB (ref 0.50–1.35)
Chloride: 110 mmol/L (ref 96–112)
GFR calc Af Amer: 20 mL/min — ABNORMAL LOW (ref >90)
GFR, EST NON AFRICAN AMERICAN: 17 mL/min — AB (ref >90)
Glucose, Bld: 147 mg/dL — ABNORMAL HIGH (ref 70–99)
Potassium: 5.7 mmol/L — ABNORMAL HIGH (ref 3.5–5.1)
Sodium: 144 mmol/L (ref 135–145)
TOTAL PROTEIN: 5.8 g/dL — AB (ref 6.0–8.3)

## 2015-01-08 LAB — PROTIME-INR
INR: 1.12 (ref 0.00–1.49)
Prothrombin Time: 14.5 seconds (ref 11.6–15.2)

## 2015-01-08 LAB — CBC
HCT: 17 % — ABNORMAL LOW (ref 39.0–52.0)
Hemoglobin: 5.3 g/dL — CL (ref 13.0–17.0)
MCH: 27.7 pg (ref 26.0–34.0)
MCHC: 31.2 g/dL (ref 30.0–36.0)
MCV: 89 fL (ref 78.0–100.0)
Platelets: 201 10*3/uL (ref 150–400)
RBC: 1.91 MIL/uL — ABNORMAL LOW (ref 4.22–5.81)
RDW: 18.3 % — AB (ref 11.5–15.5)
WBC: 13.4 10*3/uL — ABNORMAL HIGH (ref 4.0–10.5)

## 2015-01-08 LAB — POC OCCULT BLOOD, ED: FECAL OCCULT BLD: POSITIVE — AB

## 2015-01-08 LAB — PREPARE RBC (CROSSMATCH)

## 2015-01-08 MED ORDER — TORSEMIDE 10 MG PO TABS
10.0000 mg | ORAL_TABLET | Freq: Every day | ORAL | Status: DC
  Administered 2015-01-08: 10 mg via ORAL
  Filled 2015-01-08: qty 1

## 2015-01-08 MED ORDER — SODIUM CHLORIDE 0.9 % IV SOLN
Freq: Once | INTRAVENOUS | Status: AC
  Administered 2015-01-08: 16:00:00 via INTRAVENOUS

## 2015-01-08 MED ORDER — TORSEMIDE 10 MG PO TABS
10.0000 mg | ORAL_TABLET | Freq: Once | ORAL | Status: AC
  Administered 2015-01-08: 10 mg via ORAL
  Filled 2015-01-08: qty 1

## 2015-01-08 NOTE — Discharge Instructions (Signed)
Bloody Stools °Bloody stools means there is blood in your poop (stool). It is a sign that there is a problem somewhere in the digestive system. It is important for your doctor to find the cause of your bleeding, so the problem can be treated.  °HOME CARE °· Only take medicine as told by your doctor. °· Eat foods with fiber (prunes, bran cereals). °· Drink enough fluids to keep your pee (urine) clear or pale yellow. °· Sit in warm water (sitz bath) for 10 to 15 minutes as told by your doctor. °· Know how to take your medicines (enemas, suppositories) if advised by your doctor. °· Watch for signs that you are getting better or getting worse. °GET HELP RIGHT AWAY IF:  °· You are not getting better. °· You start to get better but then get worse again. °· You have new problems. °· You have severe bleeding from the place where poop comes out (rectum) that does not stop. °· You throw up (vomit) blood. °· You feel weak or pass out (faint). °· You have a fever. °MAKE SURE YOU:  °· Understand these instructions. °· Will watch your condition. °· Will get help right away if you are not doing well or get worse. °Document Released: 10/05/2009 Document Revised: 01/09/2012 Document Reviewed: 03/04/2011 °ExitCare® Patient Information ©2015 [employer]. This information is not intended to replace advice given to you by your health care provider. Make sure you discuss any questions you have with your health care provider. ° °

## 2015-01-08 NOTE — ED Notes (Addendum)
HGB 5.3 PER DENNIS. Salome Arnt MD NOTIFIED

## 2015-01-08 NOTE — ED Provider Notes (Signed)
CSN: [id number]     Arrival date & time 01/08/15  1516 History   First MD Initiated Contact with Patient 01/08/15 1516     Chief Complaint  Patient presents with  . GI Bleeding     (Consider location/radiation/quality/duration/timing/severity/associated sxs/prior Treatment) Patient is a 81 y.o. male presenting with hematochezia. The history is provided by the patient, the EMS personnel and the nursing home.  Rectal Bleeding Quality:  Bright red and maroon Amount:  Moderate Timing:  Intermittent Progression:  Waxing and waning Chronicity:  Recurrent Context comment:  Diverticular disease Similar prior episodes: yes   Relieved by:  None tried Worsened by:  Nothing tried Ineffective treatments:  None tried Associated symptoms: no abdominal pain, no dizziness, no fever, no light-headedness and no loss of consciousness     Past Medical History  Diagnosis Date  . Hypertension   . History of prostate cancer 1996    treated with radiation  . Arthritis   . Gout, arthritis 2010    bil feet  . Cancer     PORSTATE-HX OF RADIATION AND SURGERY  . Diverticulosis of colon   . CAD (coronary artery disease)     cabg 1996; echo 03/15/2007-EF 45-50%, LA mod to severe dilated, mod TR, mod pulmonary HTN, mod sclerotic aortic valve; myoview6/23/2010- no ischemia  . A-fib     chronic, no longer on coumadin due to rectal bleeding  . Hyperlipidemia   . Rectal bleed   . CHF (congestive heart failure) 10/18/13   Past Surgical History  Procedure Laterality Date  . Coronary artery bypass graft  04/1995    LIMA to LAD, vein graft to diag branch, vein to 3 sequential marginal branches, and vein to PDA  . Appendectomy  1950s  . Eye surgery  2009    cataract surgery  . Cryoablation of prostate  06/23/2005  . Colonoscopy  11/03/2000  . Cardiac catheterization  05/04/2005    patent grafts  . Cardiac catheterization  04/10/1995    presyncopal episode on way to OR, 3V disease with left main involvement, nl  LV, CVTS was contacted  . Hip arthroplasty Left 12/09/2014    Procedure: LEFT HIP HEMIARTHROPLASTY;  Surgeon: Mcarthur Rossetti, MD;  Location: [place];  Service: Orthopedics;  Laterality: Left;   Family History  Problem Relation Age of Onset  . Heart Problems Mother   . Heart Problems Father   . Cancer Brother   . Cancer Sister    History  Substance Use Topics  . Smoking status: Former Smoker    Quit date: 09/26/1984  . Smokeless tobacco: Never Used  . Alcohol Use: No    Review of Systems  Constitutional: Negative for fever.  Respiratory: Negative for cough and shortness of breath.   Cardiovascular: Negative for chest pain.  Gastrointestinal: Positive for blood in stool and hematochezia. Negative for nausea, abdominal pain, diarrhea and constipation.  Neurological: Negative for dizziness, loss of consciousness and light-headedness.  All other systems reviewed and are negative.     Allergies  Ancef; Ace inhibitors; Penicillins cross reactors; and Sulfa antibiotics  Home Medications   Prior to Admission medications   Medication Sig Start Date End Date Taking? Authorizing Provider  acetaminophen (TYLENOL) 325 MG tablet Take 2 tablets (650 mg total) by mouth every 6 (six) hours as needed. 04/18/13   Shon Baton, MD  Amino Acids-Protein Hydrolys (FEEDING SUPPLEMENT, PRO-STAT SUGAR FREE 64,) LIQD Take 30 mLs by mouth 2 (two) times daily with a meal. 12/22/14  Cherene Altes, MD  atorvastatin (LIPITOR) 10 MG tablet Take 10 mg by mouth daily.    Historical Provider, MD  calcium carbonate (TUMS - DOSED IN MG ELEMENTAL CALCIUM) 500 MG chewable tablet Chew 1 tablet by mouth at bedtime.     Historical Provider, MD  Digoxin 62.5 MCG TABS Take 0.0625 mg by mouth every other day. 12/22/14   Cherene Altes, MD  diltiazem (CARDIZEM SR) 120 MG 12 hr capsule Take 1 capsule (120 mg total) by mouth every 12 (twelve) hours. 12/22/14   Cherene Altes, MD  levalbuterol Penne Lash) 0.63 MG/3ML  nebulizer solution Take 3 mLs (0.63 mg total) by nebulization every 3 (three) hours as needed for wheezing or shortness of breath. 12/22/14   Cherene Altes, MD  levothyroxine (SYNTHROID, LEVOTHROID) 75 MCG tablet Take 75 mcg by mouth daily. 10/29/14   Historical Provider, MD  Multiple Vitamin (MULTIVITAMIN WITH MINERALS) TABS tablet Take 1 tablet by mouth daily. 12/22/14   Cherene Altes, MD  pantoprazole (PROTONIX) 40 MG tablet Take 40 mg by mouth 2 (two) times daily.    Historical Provider, MD  polyethylene glycol (MIRALAX / GLYCOLAX) packet Take 17 g by mouth daily as needed for mild constipation. 12/22/14   Cherene Altes, MD   BP 102/50 mmHg  Pulse 98  Temp(Src) 98.1 F (36.7 C) (Oral)  Resp 31  SpO2 100% Physical Exam  Constitutional: He appears well-developed and well-nourished. No distress.  Older male in NAD. Pleasant, cooperative. Well hydrated  HENT:  Head: Normocephalic and atraumatic.  Mouth/Throat: Oropharynx is clear and moist. No oropharyngeal exudate.  Eyes: EOM are normal. Pupils are equal, round, and reactive to light.  Pale conjunctiva  Neck: Normal range of motion. Neck supple.  Cardiovascular: Normal rate, regular rhythm, normal heart sounds and intact distal pulses.  Exam reveals no gallop and no friction rub.   No murmur heard. Pulmonary/Chest: Effort normal and breath sounds normal. No respiratory distress. He has no wheezes. He has no rales.  Abdominal: Soft. He exhibits no distension and no mass. There is no tenderness. There is no rebound and no guarding.  Genitourinary: Guaiac positive stool.  Brown stool, guiac positive  Musculoskeletal: Normal range of motion. He exhibits no edema or tenderness.  Lymphadenopathy:    He has no cervical adenopathy.  Skin: Skin is warm and dry. No rash noted. He is not diaphoretic.  Psychiatric: He has a normal mood and affect. His behavior is normal. Judgment and thought content normal.  Nursing note and vitals  reviewed.   ED Course  Procedures (including critical care time) Labs Review Labs Reviewed  CBC - Abnormal; Notable for the following:    WBC 13.4 (*)    RBC 1.91 (*)    Hemoglobin 5.3 (*)    HCT 17.0 (*)    RDW 18.3 (*)    All other components within normal limits  COMPREHENSIVE METABOLIC PANEL - Abnormal; Notable for the following:    Potassium 5.7 (*)    Glucose, Bld 147 (*)    BUN 62 (*)    Creatinine, Ser 2.93 (*)    Calcium 8.3 (*)    Total Protein 5.8 (*)    Albumin 2.0 (*)    Alkaline Phosphatase 129 (*)    GFR calc non Af Amer 17 (*)    GFR calc Af Amer 20 (*)    All other components within normal limits  POC OCCULT BLOOD, ED - Abnormal; Notable for the following:  Fecal Occult Bld POSITIVE (*)    All other components within normal limits  PROTIME-INR  PREPARE RBC (CROSSMATCH)  TYPE AND SCREEN    Imaging Review No results found.   EKG Interpretation None      MDM   Final diagnoses:  None    DAKOTAH ORREGO is a 81 y.o. male who presents with GI bleeding. Sent from SNF with drop in hgb to 6 down from 9 over last few days. With persistent mild GI bleeding with sometimes passage of clots. Patient is DNR and daughter Chauncey Reading) and facility state they do not want the patient admitted.  Bleeding likely from known diverticular disease as this has happened in the past. Brown stool but no bright red blood per rectum. Will re-check CBC and transfuse two units per request from facility. CMP and coags also sent. Torsemide given prior to transfusion to help with volume overload which he has had at the past.  5:18 PM CBC with anemia of hgb of 5.3. Slight hyperkalemia. Will give kayexalate and repeat K before d/c. Remainder of labs near baseline. Blood transfusing.  11:15 PM Blood complete. Remains AFVSS. Will d/c back to facility. F/U with PCP or at facility tomorrow for repeat blood work.     Larence Penning, MD 01/08/15 [place], MD 01/08/15 [phone]

## 2015-01-08 NOTE — Progress Notes (Signed)
Patient ID: [name] [name], male   DOB: 03-19-1921, 81 y.o.   MRN: [id number]   Place of Service: [facility]  Allergies  Allergen Reactions  . Ancef [Cefazolin] Anaphylaxis  . Penicillins Cross Reactors Anaphylaxis and Other (See Comments)    Anaphylaxix/don't give anything in the "family" of penicillins  . Ace Inhibitors Cough  . Sulfa Antibiotics Other (See Comments)    Don't remember. Didn't want to give me anymore    Code Status: DNR  Goals of Care: Comfort and Quality of Life/STR  Chief Complaint  Patient presents with  . Acute Visit    GI bleeding with Hgb 6.0    HPI  81 y.o. male with complex medical history including HTN, severe pulmonary HTN, CHF, CAD s/p CABG, AS, diverticulosis, afib, prostate ca, GI bleed, left hip fracture s/p hemiarthroplasty among many others is being seen for an acute visit for the evaluation of anemia secondary to GI bleed. Repeat hemoglobin 6.0 today. Seen in room today. Denies any pain, discomfort, or any concerns.   Review of Systems Constitutional: Negative for fever and chills. HEENT: Negative for eye pain and blurry vision Cardiovascular: Negative for chest pain and palpitation Respiratory: Negative for shortness of breath and wheezing.  Gastrointestinal: Negative for nausea and vomiting. Negative for abdominal pain. Positive for bloody stool Neurological: Negative for dizziness and headache    Past Medical History  Diagnosis Date  . Hypertension   . History of prostate cancer 1996    treated with radiation  . Arthritis   . Gout, arthritis 2010    bil feet  . Cancer     PORSTATE-HX OF RADIATION AND SURGERY  . Diverticulosis of colon   . CAD (coronary artery disease)     cabg 1996; echo 03/15/2007-EF 45-50%, LA mod to severe dilated, mod TR, mod pulmonary HTN, mod sclerotic aortic valve; myoview6/23/2010- no ischemia  . A-fib     chronic, no longer on coumadin due to rectal bleeding  . Hyperlipidemia   . Rectal bleed    . CHF (congestive heart failure) 10/18/13    Past Surgical History  Procedure Laterality Date  . Coronary artery bypass graft  04/1995    LIMA to LAD, vein graft to diag branch, vein to 3 sequential marginal branches, and vein to PDA  . Appendectomy  1950s  . Eye surgery  2009    cataract surgery  . Cryoablation of prostate  06/23/2005  . Colonoscopy  11/03/2000  . Cardiac catheterization  05/04/2005    patent grafts  . Cardiac catheterization  04/10/1995    presyncopal episode on way to OR, 3V disease with left main involvement, nl LV, CVTS was contacted  . Hip arthroplasty Left 12/09/2014    Procedure: LEFT HIP HEMIARTHROPLASTY;  Surgeon: Mcarthur Rossetti, MD;  Location: [place];  Service: Orthopedics;  Laterality: Left;    History   Social History  . Marital Status: Single    Spouse Name: N/A  . Number of Children: N/A  . Years of Education: N/A   Occupational History  . Not on file.   Social History Main Topics  . Smoking status: Former Smoker    Quit date: 09/26/1984  . Smokeless tobacco: Never Used  . Alcohol Use: No  . Drug Use: No  . Sexual Activity: No   Other Topics Concern  . Not on file   Social History Narrative   Medication List Reviewed. See MAR   Physical Exam  BP 100/66 mmHg  Pulse 100  Temp(Src) 99.5 F (37.5 C)  Resp 20  SpO2 95%  Constitutional: Frail elderly male in no acute distress. Conversant and pleasant HEENT: Normocephalic and atraumatic. PERRL. EOM intact. No scleral icterus. Neck: No JVD or carotid bruits. Cardiac: Irregularly irregular with 3/6 systolic murmur. Distal pulses intact. 1+ pitting edema of BLE.  Lungs: No respiratory distress. Breath sounds clear bilaterally without rales, rhonchi, or wheezes. Abdomen: Audible bowel sounds in all quadrants. Soft, nontender, nondistended. Hematochezia  Musculoskeletal: able to move all extremities. Generalized weakness with limited ROM throughout.  GU: Foley cath in place.  Blood-tinged urine noted.  Skin: Warm and dry.  Neurological: Alert Psychiatric: Appropriate mood and affect.   Labs Reviewed  CBC Latest Ref Rng 01/08/2015 01/07/2015 01/02/2015  WBC 4.0 - 10.5 K/uL 13.4(H) 11.7 10.0  Hemoglobin 13.0 - 17.0 g/dL 5.3(LL) 7.8(A) 9.7(A)  Hematocrit 39.0 - 52.0 % 17.0(L) 25(A) 31(A)  Platelets 150 - 400 K/uL 201 239 344     CMP Latest Ref Rng 01/08/2015 12/25/2014 12/22/2014  Glucose 70 - 99 mg/dL 147(H) - 111(H)  BUN 6 - 23 mg/dL 62(H) 57(A) 78(H)  Creatinine 0.50 - 1.35 mg/dL 2.93(H) 2.2(A) 2.72(H)  Sodium 135 - 145 mmol/L 144 148(A) 147(H)  Potassium 3.5 - 5.1 mmol/L 5.7(H) 4.5 3.8  Chloride 96 - 112 mmol/L 110 - 110  CO2 19 - 32 mmol/L 28 - 33(H)  Calcium 8.4 - 10.5 mg/dL 8.3(L) - 8.1(L)  Total Protein 6.0 - 8.3 g/dL 5.8(L) - -  Total Bilirubin 0.3 - 1.2 mg/dL 0.4 - -  Alkaline Phos 39 - 117 U/L 129(H) - -  AST 0 - 37 U/L 17 - -  ALT 0 - 53 U/L 13 - -    Assessment & Plan 1. Acute blood loss anemia Will send patient to ER for type and cross and transfusion of 2 units of PRBCs as well torsemide 10mg  before and after transfusion. Notified daughter, Arbie Cookey and she would like for him not to be admitted, but return to facility after transfusion.   Family/Staff Communication Plan of care discussed with patient, daughter, Arbie Cookey and Therapist, art. Patient and nursing supervisor verbalized understanding and agree with plan of care. No additional questions or concerns reported.   Arthur Holms, MSN, AGNP-C [facility] [address] [place], [place] [address] [phone] [8am-5pm] After hours: [phone]

## 2015-01-08 NOTE — ED Notes (Signed)
Pt arrives from [facility] via [place]. Pt hgb is 6. Daughter states pt has been passing blood clots as large as footballs. Pt denies pain or any other sx.

## 2015-01-09 LAB — TYPE AND SCREEN
ABO/RH(D): O NEG
ANTIBODY SCREEN: NEGATIVE
Unit division: 0
Unit division: 0

## 2015-01-09 NOTE — ED Notes (Signed)
[facility] has arrived to take the pt back to [facility].

## 2015-01-09 NOTE — ED Notes (Signed)
IV Right AC removed by this RN. Bleeding controlled, site clean dry and intact.

## 2015-01-20 ENCOUNTER — Other Ambulatory Visit: Payer: Self-pay | Admitting: *Deleted

## 2015-01-20 MED ORDER — HYDROCODONE-ACETAMINOPHEN 5-325 MG PO TABS: ORAL_TABLET | ORAL | Status: DC

## 2015-01-20 NOTE — Telephone Encounter (Signed)
[facility] 

## 2015-01-21 ENCOUNTER — Other Ambulatory Visit: Payer: Self-pay | Admitting: *Deleted

## 2015-01-21 ENCOUNTER — Non-Acute Institutional Stay (SKILLED_NURSING_FACILITY): Payer: Medicare Other | Admitting: Registered Nurse

## 2015-01-21 DIAGNOSIS — D62 Acute posthemorrhagic anemia: Secondary | ICD-10-CM | POA: Diagnosis not present

## 2015-01-21 DIAGNOSIS — L89150 Pressure ulcer of sacral region, unstageable: Secondary | ICD-10-CM | POA: Diagnosis not present

## 2015-01-21 DIAGNOSIS — I5032 Chronic diastolic (congestive) heart failure: Secondary | ICD-10-CM | POA: Diagnosis not present

## 2015-01-21 DIAGNOSIS — Z7189 Other specified counseling: Secondary | ICD-10-CM | POA: Diagnosis not present

## 2015-01-21 DIAGNOSIS — N184 Chronic kidney disease, stage 4 (severe): Secondary | ICD-10-CM | POA: Diagnosis not present

## 2015-01-21 DIAGNOSIS — K922 Gastrointestinal hemorrhage, unspecified: Secondary | ICD-10-CM

## 2015-01-21 DIAGNOSIS — S72002S Fracture of unspecified part of neck of left femur, sequela: Secondary | ICD-10-CM

## 2015-01-21 DIAGNOSIS — R339 Retention of urine, unspecified: Secondary | ICD-10-CM | POA: Diagnosis not present

## 2015-01-21 DIAGNOSIS — L89620 Pressure ulcer of left heel, unstageable: Secondary | ICD-10-CM

## 2015-01-21 DIAGNOSIS — E46 Unspecified protein-calorie malnutrition: Secondary | ICD-10-CM | POA: Diagnosis not present

## 2015-01-21 DIAGNOSIS — E039 Hypothyroidism, unspecified: Secondary | ICD-10-CM

## 2015-01-21 LAB — CBC AND DIFFERENTIAL
HCT: 16 % — AB (ref 41–53)
Hemoglobin: 5.2 g/dL — AB (ref 13.5–17.5)
Platelets: 266 10*3/uL (ref 150–399)
WBC: 9.8 10^3/mL

## 2015-01-21 MED ORDER — HYDROCODONE-ACETAMINOPHEN 5-325 MG PO TABS: ORAL_TABLET | ORAL | Status: DC

## 2015-01-21 NOTE — Progress Notes (Signed)
Patient ID: TYBERIUS RYNER, male   DOB: 05/16/1921, 81 y.o.   MRN: [id number]   Place of Service: [facility]  Allergies  Allergen Reactions  . Ancef [Cefazolin] Anaphylaxis  . Penicillins Cross Reactors Anaphylaxis and Other (See Comments)    Anaphylaxix/don't give anything in the "family" of penicillins  . Ace Inhibitors Cough  . Sulfa Antibiotics Other (See Comments)    Don't remember. Didn't want to give me anymore    Code Status: DNR  Goals of Care: Comfort and Quality of Life/STR  Chief Complaint  Patient presents with  . Acute Visit     goals of care discussion  . Medical Management of Chronic Issues    CHF, L hip fx, Lower GI bleed, anemia, hypothyroidism, Unstageable PU, afib     HPI  81 y.o. male with complex medical history including HTN, severe pulmonary HTN, CHF, CAD s/p CABG, AS, diverticulitis, afib, prostate ca, GI bleed, Left hip fracture among many others is being seen for a routine visit for management of his chronic issues. Has 12 lbs weight loss since admission the the facility. Stage 2 pressure ulcers of bilateral buttocks and unstageable pressure ulcer to sacral area resolved. Unstageable PU to Left heel stable. No recent CHF exacerbation. Per nursing staff, GI bleed has improved as less blood was noted in his stool during peri care after recent blood transfusion. However, his anemia is worse with h&h 5.2/16.5. afib stable. Seen in room today. Family at bedside to discuss about his goals of care. Patient reported that he would like to have Preparation H to help him with rectal pain at night and something else for left leg/foot pain as current regimen does not provide adequate pain relief. Denies any other concerns  Review of Systems Constitutional: Negative for fever and chills. HENT: Negative for ear pain and sore throat Eyes: Negative for eye pain and eye discharge Cardiovascular: Negative for chest pain and palpitation Respiratory: Negative for  shortness of breath and wheezing.  Gastrointestinal: Negative for nausea and vomiting. Negative for abdominal pain Musculoskeletal: Negative for back pain. Positive for Left leg/foot pain Neurological: Negative for dizziness and headache Skin: Negative for rash and itchiness   Psychiatric: Negative for depression  Past Medical History  Diagnosis Date  . Hypertension   . History of prostate cancer 1996    treated with radiation  . Arthritis   . Gout, arthritis 2010    bil feet  . Cancer     PORSTATE-HX OF RADIATION AND SURGERY  . Diverticulosis of colon   . CAD (coronary artery disease)     cabg 1996; echo 03/15/2007-EF 45-50%, LA mod to severe dilated, mod TR, mod pulmonary HTN, mod sclerotic aortic valve; myoview6/23/2010- no ischemia  . A-fib     chronic, no longer on coumadin due to rectal bleeding  . Hyperlipidemia   . Rectal bleed   . CHF (congestive heart failure) 10/18/13    Past Surgical History  Procedure Laterality Date  . Coronary artery bypass graft  04/1995    LIMA to LAD, vein graft to diag branch, vein to 3 sequential marginal branches, and vein to PDA  . Appendectomy  1950s  . Eye surgery  2009    cataract surgery  . Cryoablation of prostate  06/23/2005  . Colonoscopy  11/03/2000  . Cardiac catheterization  05/04/2005    patent grafts  . Cardiac catheterization  04/10/1995    presyncopal episode on way to OR, 3V disease with left  main involvement, nl LV, CVTS was contacted  . Hip arthroplasty Left 12/09/2014    Procedure: LEFT HIP HEMIARTHROPLASTY;  Surgeon: Mcarthur Rossetti, MD;  Location: [place];  Service: Orthopedics;  Laterality: Left;    History   Social History  . Marital Status: Single    Spouse Name: N/A  . Number of Children: N/A  . Years of Education: N/A   Occupational History  . Not on file.   Social History Main Topics  . Smoking status: Former Smoker    Quit date: 09/26/1984  . Smokeless tobacco: Never Used  . Alcohol Use: No  .  Drug Use: No  . Sexual Activity: No   Other Topics Concern  . Not on file   Social History Narrative      Medication List       This list is accurate as of: 01/21/15 11:59 PM.  Always use your most recent med list.               acetaminophen 325 MG tablet  Commonly known as:  TYLENOL  Take 650 mg by mouth 3 (three) times daily.     albuterol (2.5 MG/3ML) 0.083% nebulizer solution  Commonly known as:  PROVENTIL  Take 2.5 mg by nebulization every 6 (six) hours as needed for wheezing or shortness of breath.     budesonide 3 MG 24 hr capsule  Commonly known as:  ENTOCORT EC  Take 9 mg by mouth daily.     Calcium Alginate Powd  Apply 1 application topically daily.     collagenase ointment  Commonly known as:  SANTYL  Apply 1 application topically daily.     Digoxin 62.5 MCG Tabs  Take 0.0625 mg by mouth every other day.     diltiazem 120 MG 12 hr capsule  Commonly known as:  CARDIZEM SR  Take 1 capsule (120 mg total) by mouth every 12 (twelve) hours.     feeding supplement (PRO-STAT SUGAR FREE 64) Liqd  Take 30 mLs by mouth 2 (two) times daily with a meal.     levalbuterol 0.63 MG/3ML nebulizer solution  Commonly known as:  XOPENEX  Take 3 mLs (0.63 mg total) by nebulization every 3 (three) hours as needed for wheezing or shortness of breath.     levothyroxine 75 MCG tablet  Commonly known as:  SYNTHROID, LEVOTHROID  Take 75 mcg by mouth daily.     morphine 20 MG/ML concentrated solution  Commonly known as:  ROXANOL  Take 2.5 mg by mouth every 4 (four) hours as needed for moderate pain, severe pain, breakthrough pain, anxiety or shortness of breath.     pantoprazole 40 MG tablet  Commonly known as:  PROTONIX  Take 40 mg by mouth 2 (two) times daily.     polyethylene glycol packet  Commonly known as:  MIRALAX / GLYCOLAX  Take 17 g by mouth daily as needed for mild constipation.     primidone 50 MG tablet  Commonly known as:  MYSOLINE  Take 50 mg by  mouth at bedtime.     shark liver oil-cocoa butter 0.25-3-85.5 % suppository  Commonly known as:  PREPARATION H  Place 1 suppository rectally at bedtime. And BID PRN     ULORIC 40 MG tablet  Generic drug:  febuxostat  Take 40 mg by mouth daily.     Zinc Oxide 30.6 % Crea  Apply 1 application topically as needed (for rash or redness).  Physical Exam  BP 110/52 mmHg  Pulse 83  Temp(Src) 96.9 F (36.1 C)  Resp 20  Ht 5\' 6"  (1.676 m)  Wt 159 lb 4.8 oz (72.258 kg)  BMI 25.72 kg/m2  Constitutional: Frail elderly male in no acute distress. Conversant and pleasant HEENT: Normocephalic and atraumatic. PERRL. EOM intact. No sceral icterus. Pale conjunctiva. Posterior pharynx clear of any exudate or lesions.  Neck: Supple and nontender. No lymphadenopathy, masses, or thyromegaly. No JVD or carotid bruits. Cardiac: Irregularly irregular with 3/6 systolic murmur. Distal pulses intact. 1+ pitting edema of BLE.  Lungs: No respiratory distress. Breath sounds clear bilaterally without rales, rhonchi, or wheezes. Abdomen: Audible bowel sounds in all quadrants. Soft, nontender, nondistended. Musculoskeletal: able to move all extremities. Generalized weakness with limited ROM throughout. LLE tender to palpation. Left hip surgical incision w/o signs of infection. Almost completely healed.  GU: Foley cath in place Skin: Warm and dry. Hyperpigmentation of BLE, consistent with chronic venous insufficiency. BLE also has a few open ulcers. 3x4.5cm unstageable ulcer of the Left heel with 100% light black eschar. LLE boot in place.  Psychiatric: Judgement and insight adequate. Appropriate mood and affect.   Labs Reviewed  CBC Latest Ref Rng 01/21/2015 01/08/2015 01/07/2015  WBC - 9.8 13.4(H) 11.7  Hemoglobin 13.5 - 17.5 g/dL 5.2(A) 5.3(LL) 7.8(A)  Hematocrit 41 - 53 % 16(A) 17.0(L) 25(A)  Platelets 150 - 399 K/L 266 201 239     CMP Latest Ref Rng 01/08/2015 12/25/2014 12/22/2014  Glucose 70 - 99  mg/dL 147(H) - 111(H)  BUN 6 - 23 mg/dL 62(H) 57(A) 78(H)  Creatinine 0.50 - 1.35 mg/dL 2.93(H) 2.2(A) 2.72(H)  Sodium 135 - 145 mmol/L 144 148(A) 147(H)  Potassium 3.5 - 5.1 mmol/L 5.7(H) 4.5 3.8  Chloride 96 - 112 mmol/L 110 - 110  CO2 19 - 32 mmol/L 28 - 33(H)  Calcium 8.4 - 10.5 mg/dL 8.3(L) - 8.1(L)  Total Protein 6.0 - 8.3 g/dL 5.8(L) - -  Total Bilirubin 0.3 - 1.2 mg/dL 0.4 - -  Alkaline Phos 39 - 117 U/L 129(H) - -  AST 0 - 37 U/L 17 - -  ALT 0 - 53 U/L 13 - -    Assessment & Plan 1. Chronic diastolic heart failure Clinically compensated. Continue digoxin 62.36mcg every other day. Continue to monitor weight and his status.   2. Closed left hip fracture, with routine healing, subsequent encounter Change tylenol to 650mg  three time daily routinely. Discontinue norco. Start roxanol 2.5mg  every four hours as needed for pain. Fall risks precautions.   3. Lower GI bleed Ongoing. Has hx of diverticulitis and GI bleed. No improvement in anemia despite recent blood transfusion (2 units of PRBCs). No more blood transfusion and continue budesonide 9mg  daily per family request.   4. Urinary retention Stable. Foley cath in place. Continue cath care.   5. Hypothyroidism, unspecified hypothyroidism type Stable. Continue levothyroxine 41mcg daily  6. Acute blood loss anemia Secondary to GI bleed. See #3  7. Atrial fibrillation, unspecified Rate-controlled. Continue digoxin 62.31mcg every other day with diltiazem 120mg  twice daily. Continue to monitor  8. Pressure ulcer, heel, left, unstageable Stable. Continue skin prep with thick layer of foam dressing every 4 days. Continue pressure ulcer prophylaxis   9. Sacral pressure ulcer, unstageable Resolved. Continue silicone dressing every  4 days to prevent recurrence and other pressure ulcer prophylaxis.   10. Protein Calorie Malnutrition Has 12 lbs weight loss over the past 30 days. Continue mechanical soft diet  and prostat. Patient  stated that he likes chocolate shakes-will add House shake 126mL (chocolate flavor) to each meals for nutritional support.  Continue to monitor.   11. Goals of care discussion/counseling  I had a long talk with the patient, his two daughters, and son-in-law about his goals of care. We discussed in detail his poor prognosis. Hospice consult initiated with patient and family's approval. Orders in place for no hospitalization and no blood work. Start preparation H PR daily at bedtime and twice daily as needed per patient request. Discontinue multivitamins, tums, norco, and recticare per family request.     Family/Staff Communication Plan of care discussed with resident, family, and nursing staff. Resident, family, and nursing staff verbalized understanding and agree with plan of care. No additional questions or concerns reported.    Arthur Holms, MSN, AGNP-C [facility] [address] [place], [place] [address] [phone] [8am-5pm] After hours: [phone]

## 2015-01-22 ENCOUNTER — Encounter: Payer: Self-pay | Admitting: Registered Nurse

## 2015-02-20 ENCOUNTER — Encounter: Payer: Self-pay | Admitting: Registered Nurse

## 2015-02-20 ENCOUNTER — Non-Acute Institutional Stay (SKILLED_NURSING_FACILITY): Payer: Medicare Other | Admitting: Registered Nurse

## 2015-02-20 DIAGNOSIS — R339 Retention of urine, unspecified: Secondary | ICD-10-CM

## 2015-02-20 DIAGNOSIS — D62 Acute posthemorrhagic anemia: Secondary | ICD-10-CM

## 2015-02-20 DIAGNOSIS — R05 Cough: Secondary | ICD-10-CM

## 2015-02-20 DIAGNOSIS — I5032 Chronic diastolic (congestive) heart failure: Secondary | ICD-10-CM

## 2015-02-20 DIAGNOSIS — I4891 Unspecified atrial fibrillation: Secondary | ICD-10-CM

## 2015-02-20 DIAGNOSIS — E039 Hypothyroidism, unspecified: Secondary | ICD-10-CM

## 2015-02-20 DIAGNOSIS — L89620 Pressure ulcer of left heel, unstageable: Secondary | ICD-10-CM | POA: Diagnosis not present

## 2015-02-20 DIAGNOSIS — R635 Abnormal weight gain: Secondary | ICD-10-CM | POA: Diagnosis not present

## 2015-02-20 DIAGNOSIS — E46 Unspecified protein-calorie malnutrition: Secondary | ICD-10-CM | POA: Diagnosis not present

## 2015-02-20 DIAGNOSIS — S72002S Fracture of unspecified part of neck of left femur, sequela: Secondary | ICD-10-CM

## 2015-02-20 DIAGNOSIS — N184 Chronic kidney disease, stage 4 (severe): Secondary | ICD-10-CM | POA: Diagnosis not present

## 2015-02-20 DIAGNOSIS — R059 Cough, unspecified: Secondary | ICD-10-CM

## 2015-02-20 NOTE — Progress Notes (Signed)
Patient ID: [name] [name], male   DOB: [birth year], 81 y.o.   MRN: [id number]   Place of Service: [facility]  Allergies  Allergen Reactions  . Ancef [Cefazolin] Anaphylaxis  . Penicillins Cross Reactors Anaphylaxis and Other (See Comments)    Anaphylaxix/don't give anything in the "family" of penicillins  . Ace Inhibitors Cough  . Sulfa Antibiotics Other (See Comments)    Don't remember. Didn't want to give me anymore    Code Status: DNR  Goals of Care: Comfort and Quality of Life/Hospice (No hospitalization, no lab draws)  Chief Complaint  Patient presents with  . Medical Management of Chronic Issues    Anemia, Left hip fx, L heel ucer, UR, CHF, hypothyroidism     HPI  81 y.o. male with complex medical history including HTN, severe pulmonary HTN, CHF, CAD s/p CABG, AS, diverticulitis, afib, prostate ca, GI bleed, Left hip fracture among many others is being seen for a routine visit for management of his chronic issues. Has significant weight gain of 30lbs over the past 30 days. Unstageable PU to Left heel stable. No recent GI bleed reported. Remains asymptomatic despite anemia. Foley cath is in place for UR. Hypothyroidism stable on synthroid. Seen in room today. Patient stated he would like to get out of bed and sit in his wheelchair. No complaints verbalized.   Review of Systems Constitutional: Negative for fever and chills. HENT: Negative for ear pain and sore throat Eyes: Negative for eye pain and eye discharge Cardiovascular: Negative for chest pain and palpitation. Positive for leg swelling Respiratory: Negative for shortness of breath and wheezing. Positive for cough and orthopnea Gastrointestinal: Negative for nausea and vomiting. Negative for abdominal pain Musculoskeletal: Negative for uncontrolled pain Neurological: Negative for dizziness and headache Skin: Negative for rash and itchiness   Psychiatric: Negative for depression  Past Medical History    Diagnosis Date  . Hypertension   . History of prostate cancer 1996    treated with radiation  . Arthritis   . Gout, arthritis 2010    bil feet  . Cancer     PORSTATE-HX OF RADIATION AND SURGERY  . Diverticulosis of colon   . CAD (coronary artery disease)     cabg 1996; echo 03/15/2007-EF 45-50%, LA mod to severe dilated, mod TR, mod pulmonary HTN, mod sclerotic aortic valve; myoview6/23/2010- no ischemia  . A-fib     chronic, no longer on coumadin due to rectal bleeding  . Hyperlipidemia   . Rectal bleed   . CHF (congestive heart failure) 10/18/13    Past Surgical History  Procedure Laterality Date  . Coronary artery bypass graft  04/1995    LIMA to LAD, vein graft to diag branch, vein to 3 sequential marginal branches, and vein to PDA  . Appendectomy  1950s  . Eye surgery  2009    cataract surgery  . Cryoablation of prostate  06/23/2005  . Colonoscopy  11/03/2000  . Cardiac catheterization  05/04/2005    patent grafts  . Cardiac catheterization  04/10/1995    presyncopal episode on way to OR, 3V disease with left main involvement, nl LV, CVTS was contacted  . Hip arthroplasty Left 12/09/2014    Procedure: LEFT HIP HEMIARTHROPLASTY;  Surgeon: Mcarthur Rossetti, MD;  Location: [place];  Service: Orthopedics;  Laterality: Left;    History   Social History  . Marital Status: Single    Spouse Name: N/A  . Number of Children: N/A  . Years  of Education: N/A   Occupational History  . Not on file.   Social History Main Topics  . Smoking status: Former Smoker    Quit date: 09/26/1984  . Smokeless tobacco: Never Used  . Alcohol Use: No  . Drug Use: No  . Sexual Activity: No   Other Topics Concern  . Not on file   Social History Narrative      Medication List       This list is accurate as of: 02/20/15  2:25 PM.  Always use your most recent med list.               acetaminophen 325 MG tablet  Commonly known as:  TYLENOL  Take 650 mg by mouth 3 (three) times  daily.     albuterol (2.5 MG/3ML) 0.083% nebulizer solution  Commonly known as:  PROVENTIL  Take 2.5 mg by nebulization every 6 (six) hours as needed for wheezing or shortness of breath.     budesonide 3 MG 24 hr capsule  Commonly known as:  ENTOCORT EC  Take 9 mg by mouth daily.     Digoxin 62.5 MCG Tabs  Take 0.0625 mg by mouth every other day.     diltiazem 120 MG 12 hr capsule  Commonly known as:  CARDIZEM SR  Take 1 capsule (120 mg total) by mouth every 12 (twelve) hours.     feeding supplement (PRO-STAT SUGAR FREE 64) Liqd  Take 30 mLs by mouth 2 (two) times daily with a meal.     levothyroxine 75 MCG tablet  Commonly known as:  SYNTHROID, LEVOTHROID  Take 75 mcg by mouth daily.     morphine 20 MG/ML concentrated solution  Commonly known as:  ROXANOL  Take 2.5 mg by mouth every 4 (four) hours as needed for moderate pain, severe pain, breakthrough pain, anxiety or shortness of breath.     pantoprazole 40 MG tablet  Commonly known as:  PROTONIX  Take 40 mg by mouth 2 (two) times daily.     polyethylene glycol packet  Commonly known as:  MIRALAX / GLYCOLAX  Take 17 g by mouth daily as needed for mild constipation.     primidone 50 MG tablet  Commonly known as:  MYSOLINE  Take 50 mg by mouth at bedtime.     shark liver oil-cocoa butter 0.25-3-85.5 % suppository  Commonly known as:  PREPARATION H  Place 1 suppository rectally at bedtime. And BID PRN     ULORIC 40 MG tablet  Generic drug:  febuxostat  Take 40 mg by mouth daily.     Zinc Oxide 30.6 % Crea  Apply 1 application topically as needed (for rash or redness).        Physical Exam  BP 135/76 mmHg  Pulse 85  Temp(Src) 98.6 F (37 C)  Resp 17  Ht 5\' 6"  (1.676 m)  Wt 189 lb 3.2 oz (85.821 kg)  BMI 30.55 kg/m2  Constitutional: Frail elderly male in no acute distress. Conversant and pleasant HEENT: Normocephalic and atraumatic. PERRL. EOM intact. No sceral icterus. Pale conjunctiva. Posterior  pharynx clear of any exudate or lesions.  Neck: Supple and nontender. No lymphadenopathy, masses, or thyromegaly. No JVD or carotid bruits. Cardiac: Irregularly irregular with 3/6 systolic murmur. Distal pulses intact. 2+ pitting edema of BLE. (Ace wrap in place) Lungs: No respiratory distress. Breath sounds coarse bilaterally with rhonchi throughout lung field.  Abdomen: Audible bowel sounds in all quadrants. Soft, nontender, nondistended. Musculoskeletal: able to  move all extremities. Generalized weakness with limited ROM throughout.  GU: Foley cath in place Skin: Warm and dry. 3x4.5cm unstageable ulcer of the Left heel with 100% light black eschar. Psychiatric: Judgement and insight adequate. Appropriate mood and affect.   Labs Reviewed  CBC Latest Ref Rng 01/21/2015 01/08/2015 01/07/2015  WBC - 9.8 13.4(H) 11.7  Hemoglobin 13.5 - 17.5 g/dL 5.2(A) 5.3(LL) 7.8(A)  Hematocrit 41 - 53 % 16(A) 17.0(L) 25(A)  Platelets 150 - 399 K/L 266 201 239     CMP Latest Ref Rng 01/08/2015 12/25/2014 12/22/2014  Glucose 70 - 99 mg/dL 147(H) - 111(H)  BUN 6 - 23 mg/dL 62(H) 57(A) 78(H)  Creatinine 0.50 - 1.35 mg/dL 2.93(H) 2.2(A) 2.72(H)  Sodium 135 - 145 mmol/L 144 148(A) 147(H)  Potassium 3.5 - 5.1 mmol/L 5.7(H) 4.5 3.8  Chloride 96 - 112 mmol/L 110 - 110  CO2 19 - 32 mmol/L 28 - 33(H)  Calcium 8.4 - 10.5 mg/dL 8.3(L) - 8.1(L)  Total Protein 6.0 - 8.3 g/dL 5.8(L) - -  Total Bilirubin 0.3 - 1.2 mg/dL 0.4 - -  Alkaline Phos 39 - 117 U/L 129(H) - -  AST 0 - 37 U/L 17 - -  ALT 0 - 53 U/L 13 - -    Assessment & Plan 1. Chronic diastolic heart failure Volume overload: Abnormal weight gain over the past 30 days, 2+ pitting edema of BLE, and orthopnea. Continue digoxin 62.40mcg every other day. Start lasix 40mg  daily x 3 days with Duoneb Q6H x 3 days then Q6H prn shob/wheezing. Reassess and continue to monitor his status.   2. Closed left hip fracture, sequela Change tylenol to 650mg  three time daily  routinely and roxanol 2.5mg  every four hours as needed for pain. Fall risks precautions.   3. Acute blood loss anemia  Secondary to recent GI bleed. Remains asymptomatics.  Patient has hx of diverticulitis-continue budesonide 9mg  daily. No more blood transfusion or lab draw per patient and family request.   4. Urinary retention Stable. Foley cath in place. Continue cath care and f/u with urology as scheduled.   5. Hypothyroidism, unspecified hypothyroidism type Stable. Continue levothyroxine 77mcg daily  6. Atrial fibrillation, unspecified Rate-controlled. Continue digoxin 62.37mcg every other day with diltiazem 120mg  twice daily.   7. Pressure ulcer, heel, left, unstageable Stable. Continue skin prep with thick layer of foam dressing every 4 days. Continue pressure ulcer prophylaxis   8. Protein Calorie Malnutrition Stable. Continue mechanical soft diet and prostat with add House shake 111mL (chocolate flavor) to each meals for nutritional support.  Continue to monitor.   9. Abnormal weight gain 30 lbs weight gain over the past 30 days. There has been some discrepancies with daily weight. Significant weight gain most likely secondary to human/machine error. However, some of his weight gain is likely from increased appetite and volume overload. See #1  10. CKD, stage 4 Not a candidate for dialysis. Avoid nephrotoxic agents.  11. Cough and congestion Mucinex 600mg  twice daily x 3 days then twice daily as needed.   Family/Staff Communication Plan of care discussed with resident, family, and nursing staff. Resident, family, and nursing staff verbalized understanding and agree with plan of care. No additional questions or concerns reported.    Arthur Holms, MSN, AGNP-C [facility] [address] [place], [place] [address] [phone] [8am-5pm] After hours: [phone]

## 2015-03-18 ENCOUNTER — Other Ambulatory Visit: Payer: Self-pay | Admitting: *Deleted

## 2015-03-18 MED ORDER — MORPHINE SULFATE (CONCENTRATE) 10 MG/0.5ML PO SOLN: ORAL | Status: DC

## 2015-03-20 ENCOUNTER — Encounter: Payer: Self-pay | Admitting: Registered Nurse

## 2015-03-20 ENCOUNTER — Non-Acute Institutional Stay (SKILLED_NURSING_FACILITY): Payer: Medicare Other | Admitting: Registered Nurse

## 2015-03-20 DIAGNOSIS — I5032 Chronic diastolic (congestive) heart failure: Secondary | ICD-10-CM

## 2015-03-20 DIAGNOSIS — M79605 Pain in left leg: Secondary | ICD-10-CM

## 2015-03-20 DIAGNOSIS — L89620 Pressure ulcer of left heel, unstageable: Secondary | ICD-10-CM

## 2015-03-20 DIAGNOSIS — E46 Unspecified protein-calorie malnutrition: Secondary | ICD-10-CM | POA: Diagnosis not present

## 2015-03-20 DIAGNOSIS — E039 Hypothyroidism, unspecified: Secondary | ICD-10-CM

## 2015-03-20 DIAGNOSIS — I4891 Unspecified atrial fibrillation: Secondary | ICD-10-CM | POA: Diagnosis not present

## 2015-03-20 DIAGNOSIS — M79604 Pain in right leg: Secondary | ICD-10-CM | POA: Diagnosis not present

## 2015-03-20 DIAGNOSIS — R339 Retention of urine, unspecified: Secondary | ICD-10-CM

## 2015-03-20 DIAGNOSIS — L89151 Pressure ulcer of sacral region, stage 1: Secondary | ICD-10-CM

## 2015-03-20 NOTE — Progress Notes (Signed)
Patient ID: [name] [name], male   DOB: Jan 05, 1921, 81 y.o.   MRN: [id number]   Place of Service: [facility]  Allergies  Allergen Reactions  . Ancef [Cefazolin] Anaphylaxis  . Penicillins Cross Reactors Anaphylaxis and Other (See Comments)    Anaphylaxix/don't give anything in the "family" of penicillins  . Ace Inhibitors Cough  . Sulfa Antibiotics Other (See Comments)    Don't remember. Didn't want to give me anymore    Code Status: DNR  Goals of Care: Comfort and Quality of Life/Hospice (No hospitalization, no lab draws)  Chief Complaint  Patient presents with  . Medical Management of Chronic Issues    CHF, UR, hypothyroidism, afib, PCM    HPI  81 y.o. male Hospice patient with complex medical history including HTN, severe pulmonary HTN, CHF, CAD s/p CABG, AS, diverticulitis, afib, prostate ca, GI bleed, Left hip fracture among many others is being seen for a routine visit for management of his chronic issues. Weight stable over the past 30 days. Unstageable PU to Left heel resolve. Now has a large stage 1 sacral pressure ulcer 12x8cm. No recent GI bleed reported. No change in behavior or functional status reported. Foley cath remains  in place for UR. Hypothyroidism stable on synthroid. Seen in room today. Patient stated that his bil leg pain is better, but it's still there. No other concerns reported.   Review of Systems Constitutional: Negative for fever and chills. HENT: Negative for ear pain and sore throat Eyes: Negative for eye pain and eye discharge Cardiovascular: Negative for chest pain and palpitation. Positive for leg swelling Respiratory: Positive for cough. Negative for shortness of breath and wheezing.  Gastrointestinal: Negative for nausea and vomiting. Negative for abdominal pain Musculoskeletal: Negative for uncontrolled pain Neurological: Negative for dizziness and headache Skin: Negative for rash and itchiness   Psychiatric: Negative for  depression  Past Medical History  Diagnosis Date  . Hypertension   . History of prostate cancer 1996    treated with radiation  . Arthritis   . Gout, arthritis 2010    bil feet  . Cancer     PORSTATE-HX OF RADIATION AND SURGERY  . Diverticulosis of colon   . CAD (coronary artery disease)     cabg 1996; echo 03/15/2007-EF 45-50%, LA mod to severe dilated, mod TR, mod pulmonary HTN, mod sclerotic aortic valve; myoview6/23/2010- no ischemia  . A-fib     chronic, no longer on coumadin due to rectal bleeding  . Hyperlipidemia   . Rectal bleed   . CHF (congestive heart failure) 10/18/13    Past Surgical History  Procedure Laterality Date  . Coronary artery bypass graft  04/1995    LIMA to LAD, vein graft to diag branch, vein to 3 sequential marginal branches, and vein to PDA  . Appendectomy  1950s  . Eye surgery  2009    cataract surgery  . Cryoablation of prostate  06/23/2005  . Colonoscopy  11/03/2000  . Cardiac catheterization  05/04/2005    patent grafts  . Cardiac catheterization  04/10/1995    presyncopal episode on way to OR, 3V disease with left main involvement, nl LV, CVTS was contacted  . Hip arthroplasty Left 12/09/2014    Procedure: LEFT HIP HEMIARTHROPLASTY;  Surgeon: Mcarthur Rossetti, MD;  Location: [facility];  Service: Orthopedics;  Laterality: Left;    History   Social History  . Marital Status: Single    Spouse Name: N/A  . Number of Children:  N/A  . Years of Education: N/A   Occupational History  . Not on file.   Social History Main Topics  . Smoking status: Former Smoker    Quit date: 09/26/1984  . Smokeless tobacco: Never Used  . Alcohol Use: No  . Drug Use: No  . Sexual Activity: No   Other Topics Concern  . Not on file   Social History Narrative      Medication List       This list is accurate as of: 03/20/15  2:08 PM.  Always use your most recent med list.               budesonide 3 MG 24 hr capsule  Commonly known as:  ENTOCORT EC    Take 9 mg by mouth daily.     Digoxin 62.5 MCG Tabs  Take 0.0625 mg by mouth every other day.     diltiazem 120 MG 12 hr capsule  Commonly known as:  CARDIZEM SR  Take 1 capsule (120 mg total) by mouth every 12 (twelve) hours.     feeding supplement (PRO-STAT SUGAR FREE 64) Liqd  Take 30 mLs by mouth 2 (two) times daily with a meal.     furosemide 40 MG tablet  Commonly known as:  LASIX  Take 40 mg by mouth.     ipratropium-albuterol 0.5-2.5 (3) MG/3ML Soln  Commonly known as:  DUONEB  Take 3 mLs by nebulization 3 (three) times daily. Q8H PRN     levothyroxine 75 MCG tablet  Commonly known as:  SYNTHROID, LEVOTHROID  Take 75 mcg by mouth daily.     morphine 20 MG/ML concentrated solution  Commonly known as:  ROXANOL  Take 2.5 mg by mouth 3 (three) times daily. With Q4H PRN     pantoprazole 40 MG tablet  Commonly known as:  PROTONIX  Take 40 mg by mouth daily.     polyethylene glycol packet  Commonly known as:  MIRALAX / GLYCOLAX  Take 17 g by mouth daily as needed for mild constipation.     primidone 50 MG tablet  Commonly known as:  MYSOLINE  Take 50 mg by mouth at bedtime.     shark liver oil-cocoa butter 0.25-3-85.5 % suppository  Commonly known as:  PREPARATION H  Place 1 suppository rectally at bedtime. And BID PRN     ULORIC 40 MG tablet  Generic drug:  febuxostat  Take 40 mg by mouth daily.     Zinc Oxide 30.6 % Crea  Apply 1 application topically as needed (for rash or redness).        Physical Exam  BP 142/80 mmHg  Pulse 77  Temp(Src) 98.3 F (36.8 C)  Resp 16  Ht 5\' 6"  (1.676 m)  Wt 190 lb (86.183 kg)  BMI 30.68 kg/m2  SpO2 97%  Constitutional: Frail elderly male in no acute distress. Conversant and pleasant HEENT: Normocephalic and atraumatic. PERRL. EOM intact. Pale conjunctiva. Posterior pharynx clear of any exudate or lesions.  Neck: Supple and nontender. No lymphadenopathy, masses, or thyromegaly. No JVD or carotid bruits. Cardiac:  Irregularly irregular with 3/6 systolic murmur. Distal pulses intact. BLE with una boot in place Lungs: No respiratory distress. Breath sounds diminished bilaterally with wheezes throughout lung field.  Abdomen: Audible bowel sounds in all quadrants. Soft, nontender, nondistended. Musculoskeletal: able to move all extremities. Generalized weakness with limited ROM throughout.  GU: Foley cath in place Skin: Warm and dry. Jaundice. 12x8cm stage 1 sacral ulcer-nonblanchable  erythema.  Neuro: Alert and oriented to self Psychiatric: Appropriate mood and affect.   Labs Reviewed  CBC Latest Ref Rng 01/21/2015 01/08/2015 01/07/2015  WBC - 9.8 13.4(H) 11.7  Hemoglobin 13.5 - 17.5 g/dL 5.2(A) 5.3(LL) 7.8(A)  Hematocrit 41 - 53 % 16(A) 17.0(L) 25(A)  Platelets 150 - 399 K/L 266 201 239     CMP Latest Ref Rng 01/08/2015 12/25/2014 12/22/2014  Glucose 70 - 99 mg/dL 147(H) - 111(H)  BUN 6 - 23 mg/dL 62(H) 57(A) 78(H)  Creatinine 0.50 - 1.35 mg/dL 2.93(H) 2.2(A) 2.72(H)  Sodium 135 - 145 mmol/L 144 148(A) 147(H)  Potassium 3.5 - 5.1 mmol/L 5.7(H) 4.5 3.8  Chloride 96 - 112 mmol/L 110 - 110  CO2 19 - 32 mmol/L 28 - 33(H)  Calcium 8.4 - 10.5 mg/dL 8.3(L) - 8.1(L)  Total Protein 6.0 - 8.3 g/dL 5.8(L) - -  Total Bilirubin 0.3 - 1.2 mg/dL 0.4 - -  Alkaline Phos 39 - 117 U/L 129(H) - -  AST 0 - 37 U/L 17 - -  ALT 0 - 53 U/L 13 - -    Assessment & Plan 1. Chronic diastolic heart failure Appears euvolemic on exam. Continue  Lasix 40mg  daily x 3 days. Will change Duoneb from BID to TID with Q8H PRN shob/wheezing. Continue to monitor for symptoms as he is no longer on daily weight r/t to goal of care.   2. Bilateral leg pain Discontinue tylenol as he appears jaundice. Increase roxanol from 2.5mg  BID to TID routinely with additional 2.5mg  every four as needed for pain. Reassess and continue to monitor.   3. Stage 1 sacral ulcer New wound. Continue skin skip with silicone dressing every 4 days. Continue  pressure ulcer prophylaxis  4. Urinary retention No issues. Foley cath in place. Continue cath care and f/u with urology as needed.   5. Hypothyroidism, unspecified hypothyroidism type Stable. Continue levothyroxine 33mcg daily  6. Atrial fibrillation, unspecified Stable. Rate-controlled. Continue digoxin 62.81mcg every other day with diltiazem 120mg  twice daily.   7. Pressure ulcer, heel, left, unstageable Resolved. Continue pressure ulcer prophylaxis   8. Protein Calorie Malnutrition Persists, but weight stable over the past 30 days. Continue current diet and dietary supplement.    Family/Staff Communication Plan of care discussed with resident, family, and nursing staff. Resident, family, and nursing staff verbalized understanding and agree with plan of care. No additional questions or concerns reported.    Arthur Holms, MSN, AGNP-C [facility] [address] [place], [place] [address] [phone] [8am-5pm] After hours: [phone]

## 2015-03-23 ENCOUNTER — Other Ambulatory Visit: Payer: Self-pay | Admitting: *Deleted

## 2015-03-23 MED ORDER — MORPHINE SULFATE (CONCENTRATE) 10 MG /0.5 ML PO SOLN: ORAL | Status: AC

## 2015-03-23 NOTE — Telephone Encounter (Signed)
[facility] 

## 2015-04-01 DEATH — deceased

## 2016-02-16 IMAGING — DX DG CHEST 2V
2 series · 2 of 2 positions shown · non-contrast
Comparison: PA and lateral chest 10/19/2013 and 05/17/2013.

CLINICAL DATA: Status post fall this morning with a left hip
fracture.

EXAM:
CHEST  2 VIEW

[chest lat]
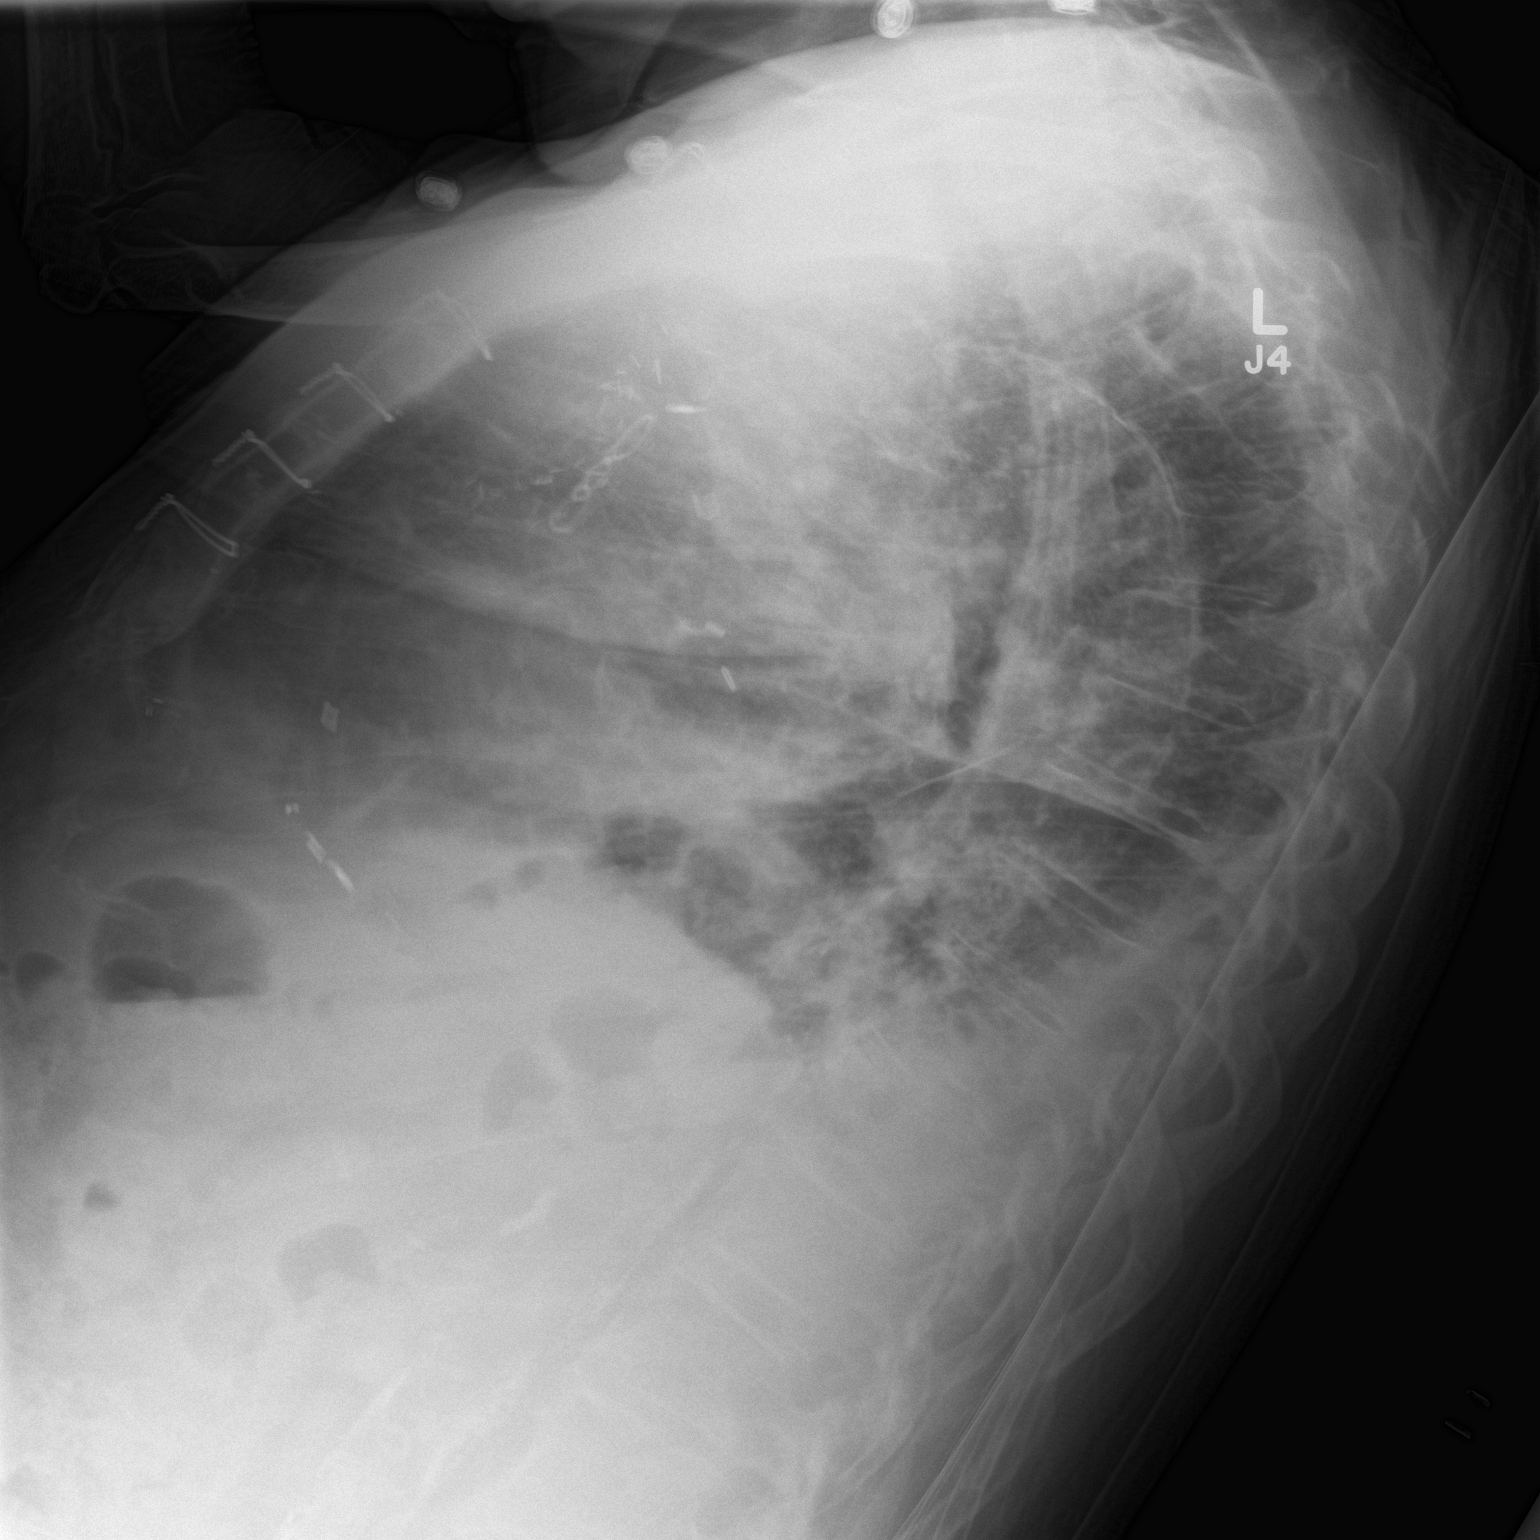

[chest ap]
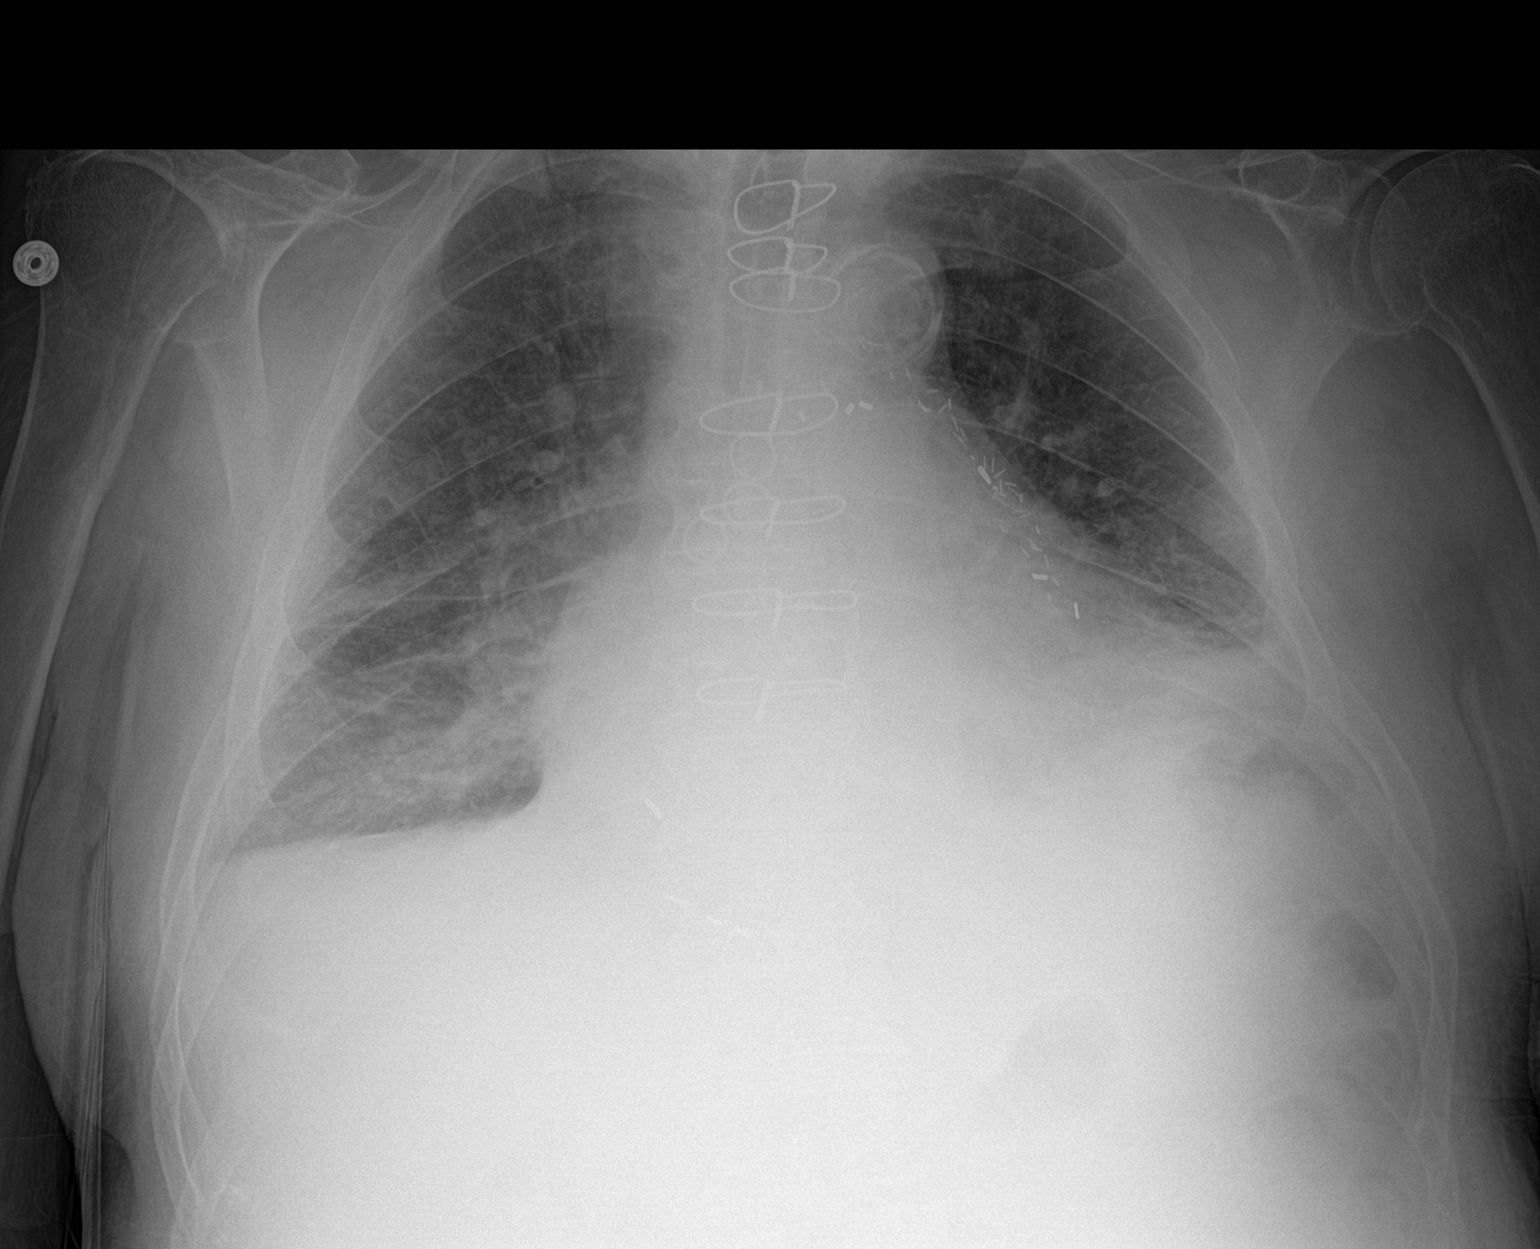

[2 of 2 positions shown; findings below may reference images not displayed]

FINDINGS: There is cardiomegaly and mild interstitial edema with small
bilateral pleural effusions. No pneumothorax is identified. No focal
bony abnormality is seen.
IMPRESSION: Mild interstitial edema with associated small bilateral pleural
effusions.

## 2016-02-18 IMAGING — CR DG CHEST 1V PORT
1 series · 1 of 1 positions shown · non-contrast
Comparison: 12/09/2014

CLINICAL DATA: Status post left hip hemiarthroplasty, check
endotracheal tube placement

EXAM:
PORTABLE CHEST - 1 VIEW

[AP]
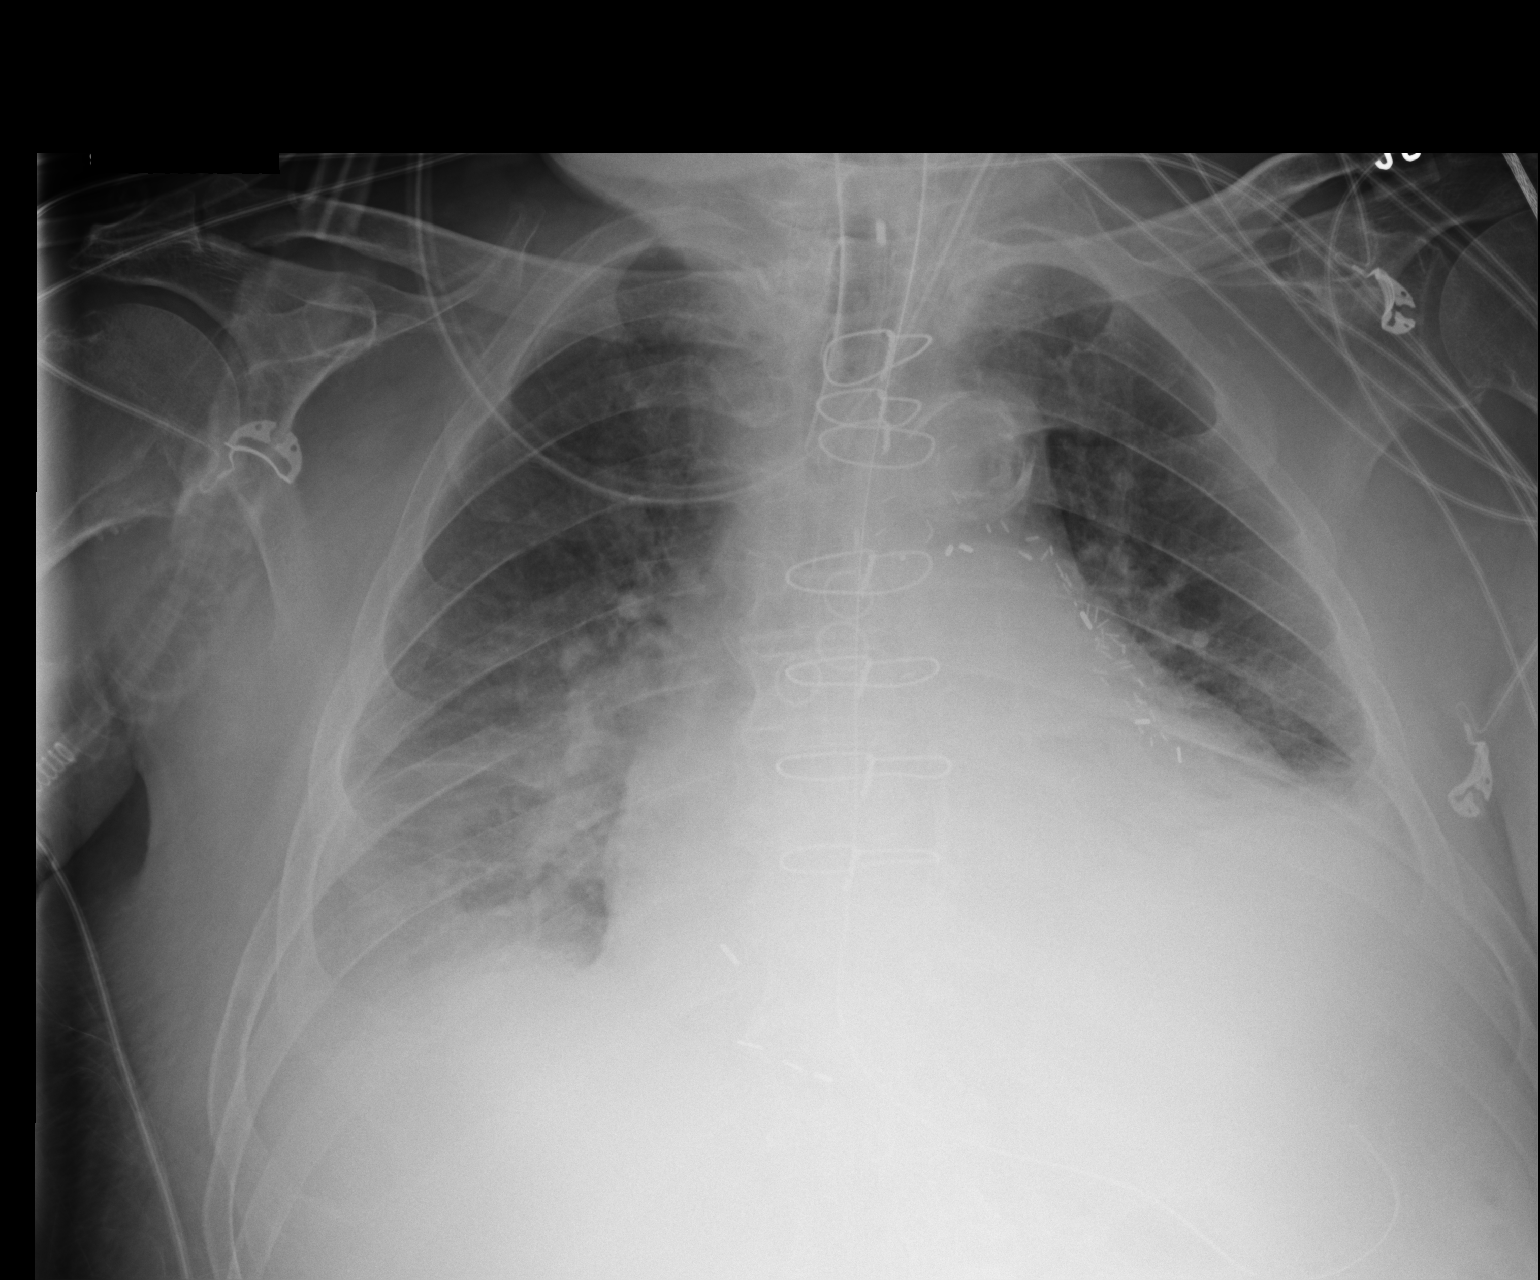

[1 of 1 positions shown; findings below may reference images not displayed]

FINDINGS: Endotracheal tube is again seen and stable at 5 cm above the carina.
Postsurgical changes are again noted. Nasogastric catheter extends
into the stomach. Bibasilar atelectatic changes and effusions are
again seen and relatively stable from the prior exam given some
change in the patient positioning. No acute bony abnormality is
seen.
IMPRESSION: Stable bibasilar atelectasis and effusions.

## 2016-02-19 IMAGING — CR DG CHEST 1V PORT
1 series · 1 of 1 positions shown · non-contrast
Comparison: T1 6099 and earlier.

CLINICAL DATA: [AGE] male status post left hip
hemiarthroplasty. Pulmonary edema. Initial encounter.

EXAM:
PORTABLE CHEST - 1 VIEW

[AP]
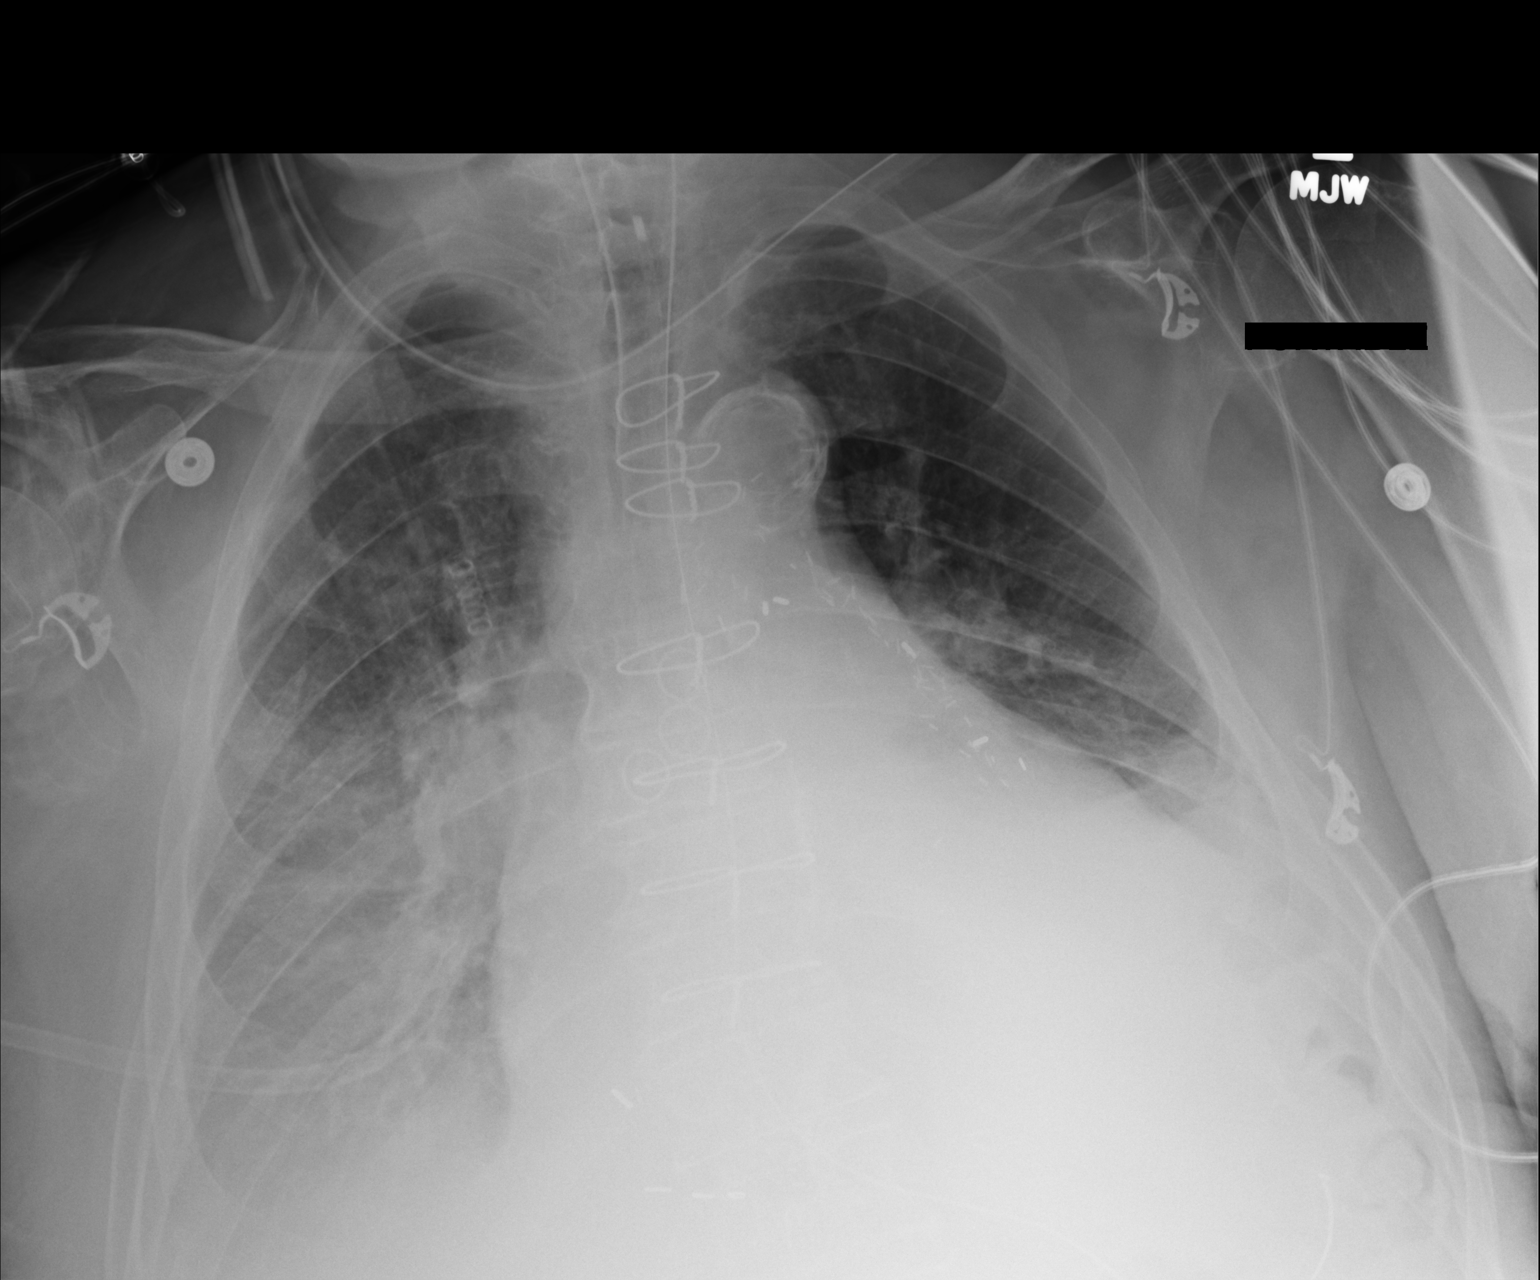

[1 of 1 positions shown; findings below may reference images not displayed]

FINDINGS: Portable AP semi upright view at 5181 hours. Endotracheal tube tip
in stable position. Enteric tube courses to the abdomen and is
stable. Stable cardiac size and mediastinal contours. Sequelae of
CABG. Bilateral veiling pulmonary opacities and dense retrocardiac
opacity. Pulmonary vascularity has not significantly changed. No
pneumothorax.
IMPRESSION: 1.  Stable lines and tubes.
2. Stable pulmonary vascular congestion/interstitial edema with
bilateral pleural effusions and bibasilar collapse/consolidation.

## 2016-02-20 IMAGING — CR DG CHEST 1V PORT
1 series · 1 of 1 positions shown · non-contrast
Comparison: December 11, 2014.

CLINICAL DATA: Respiratory failure.

EXAM:
PORTABLE CHEST - 1 VIEW

[portable]
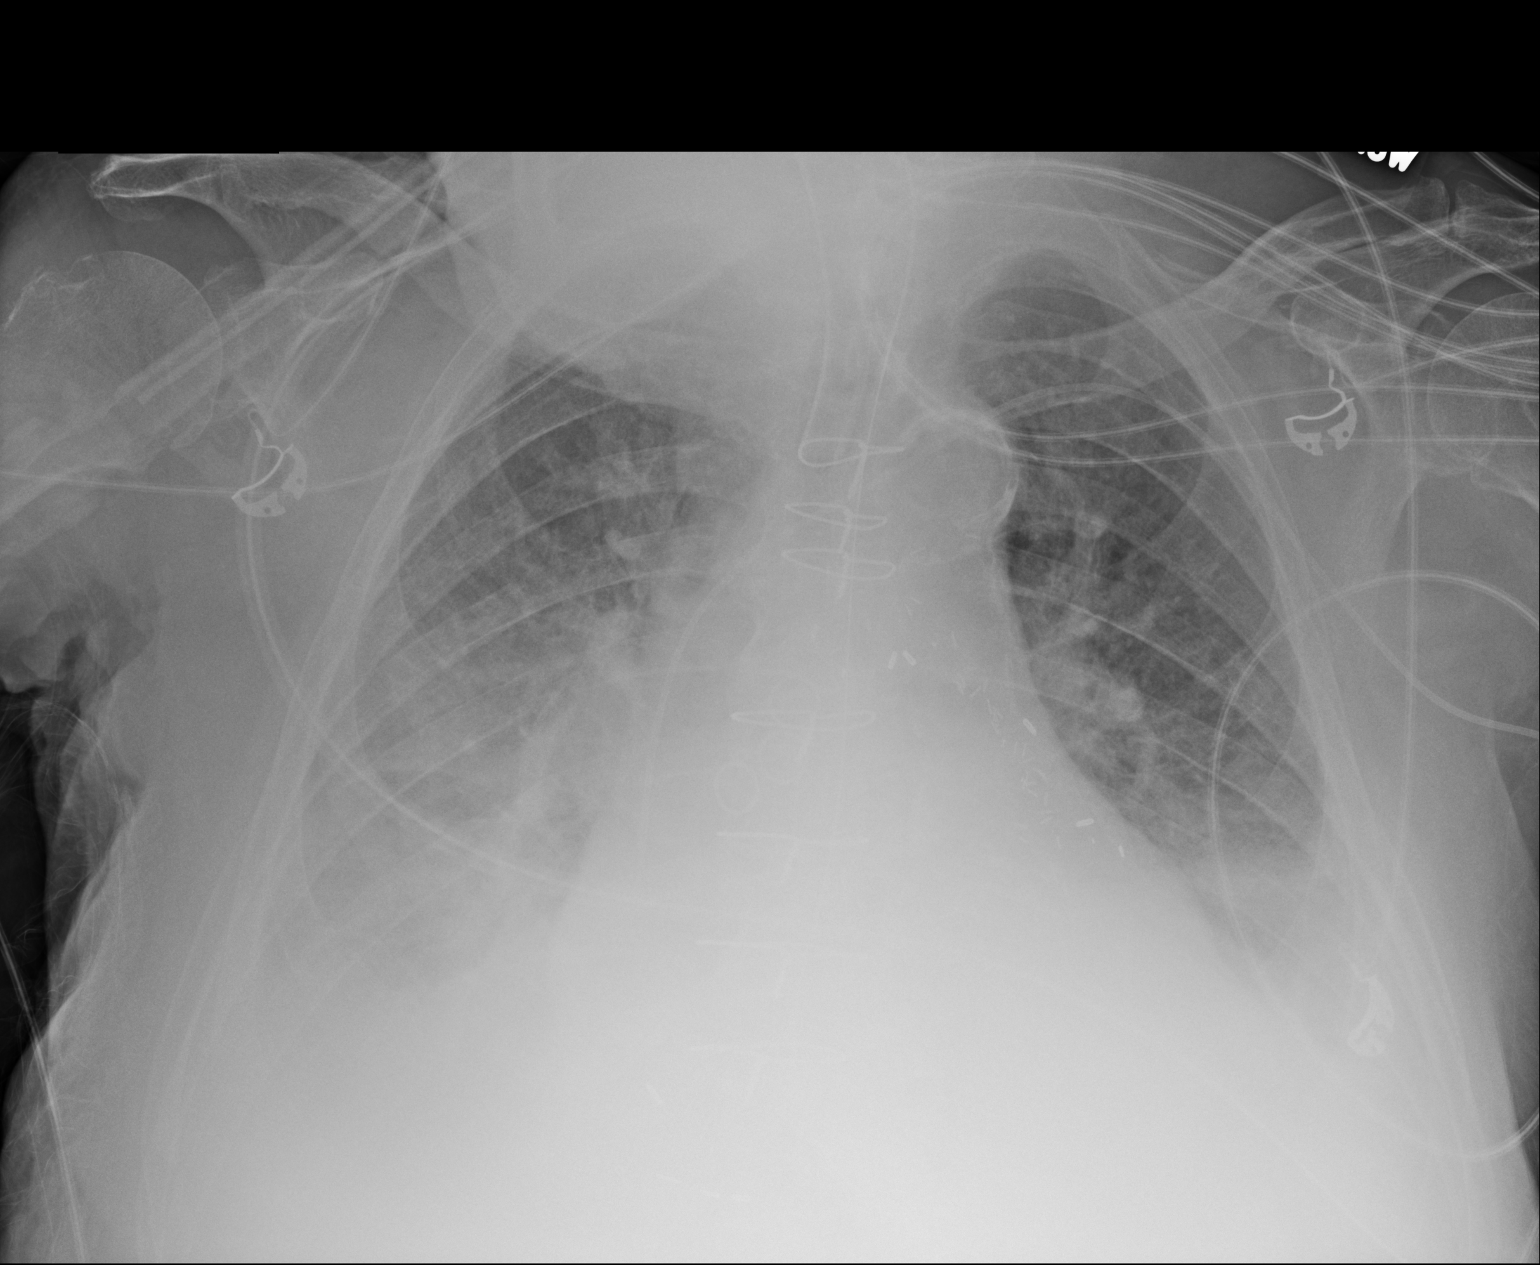

[1 of 1 positions shown; findings below may reference images not displayed]

FINDINGS: Stable cardiomegaly. Endotracheal and nasogastric tubes are in
grossly good position and unchanged compared to prior exam. Status
post coronary artery bypass graft. Left-sided PICC line is noted
with distal tip overlying expected position of cavoatrial junction.
Stable bibasilar opacities are noted consistent with pulmonary edema
with associated pleural effusions. No pneumothorax is noted.
IMPRESSION: Stable support apparatus. Stable bibasilar opacities are noted
consistent with pulmonary edema with associated pleural effusions.
